# Patient Record
Sex: Female | Born: 1935 | Race: White | Hispanic: No | Smoking: Never smoker
Health system: Southern US, Community
[De-identification: ages and names within clinical notes are randomized; demographics above are authoritative.]

## PROBLEM LIST (undated history)

## (undated) DIAGNOSIS — E785 Hyperlipidemia, unspecified: Secondary | ICD-10-CM

## (undated) DIAGNOSIS — C801 Malignant (primary) neoplasm, unspecified: Secondary | ICD-10-CM

## (undated) DIAGNOSIS — I10 Essential (primary) hypertension: Secondary | ICD-10-CM

## (undated) DIAGNOSIS — C44711 Basal cell carcinoma of skin of unspecified lower limb, including hip: Secondary | ICD-10-CM

## (undated) HISTORY — PX: AMPUTATION TOE: SHX6595

## (undated) HISTORY — DX: Basal cell carcinoma of skin of unspecified lower limb, including hip: C44.711

## (undated) HISTORY — DX: Essential (primary) hypertension: I10

## (undated) HISTORY — DX: Hyperlipidemia, unspecified: E78.5

---

## 2000-10-13 ENCOUNTER — Other Ambulatory Visit: Admission: RE | Admit: 2000-10-13 | Discharge: 2000-10-13 | Payer: Self-pay | Admitting: Family Medicine

## 2000-11-10 ENCOUNTER — Encounter: Payer: Self-pay | Admitting: Family Medicine

## 2000-11-10 ENCOUNTER — Encounter: Admission: RE | Admit: 2000-11-10 | Discharge: 2000-11-10 | Payer: Self-pay | Admitting: Family Medicine

## 2001-11-03 ENCOUNTER — Encounter: Admission: RE | Admit: 2001-11-03 | Discharge: 2001-11-03 | Payer: Self-pay | Admitting: Family Medicine

## 2001-11-03 ENCOUNTER — Encounter: Payer: Self-pay | Admitting: Family Medicine

## 2003-10-03 ENCOUNTER — Encounter: Admission: RE | Admit: 2003-10-03 | Discharge: 2003-10-03 | Payer: Self-pay | Admitting: Family Medicine

## 2003-10-14 ENCOUNTER — Encounter: Admission: RE | Admit: 2003-10-14 | Discharge: 2003-10-14 | Payer: Self-pay | Admitting: Family Medicine

## 2004-11-11 ENCOUNTER — Encounter: Admission: RE | Admit: 2004-11-11 | Discharge: 2004-11-11 | Payer: Self-pay | Admitting: Family Medicine

## 2005-11-30 ENCOUNTER — Encounter: Admission: RE | Admit: 2005-11-30 | Discharge: 2005-11-30 | Payer: Self-pay | Admitting: Family Medicine

## 2006-12-06 ENCOUNTER — Ambulatory Visit: Payer: Self-pay | Admitting: Cardiology

## 2006-12-14 ENCOUNTER — Encounter: Payer: Self-pay | Admitting: Cardiology

## 2006-12-14 ENCOUNTER — Ambulatory Visit: Payer: Self-pay

## 2006-12-21 ENCOUNTER — Encounter: Admission: RE | Admit: 2006-12-21 | Discharge: 2006-12-21 | Payer: Self-pay | Admitting: Family Medicine

## 2007-12-22 ENCOUNTER — Encounter: Admission: RE | Admit: 2007-12-22 | Discharge: 2007-12-22 | Payer: Self-pay | Admitting: Family Medicine

## 2008-12-23 ENCOUNTER — Encounter: Admission: RE | Admit: 2008-12-23 | Discharge: 2008-12-23 | Payer: Self-pay | Admitting: Family Medicine

## 2009-01-15 ENCOUNTER — Encounter: Admission: RE | Admit: 2009-01-15 | Discharge: 2009-01-15 | Payer: Self-pay | Admitting: Family Medicine

## 2009-04-15 ENCOUNTER — Ambulatory Visit (HOSPITAL_COMMUNITY): Admission: RE | Admit: 2009-04-15 | Discharge: 2009-04-15 | Payer: Self-pay | Admitting: Ophthalmology

## 2009-12-30 ENCOUNTER — Encounter: Admission: RE | Admit: 2009-12-30 | Discharge: 2009-12-30 | Payer: Self-pay | Admitting: Family Medicine

## 2010-06-08 LAB — HEMOGLOBIN AND HEMATOCRIT, BLOOD: Hemoglobin: 12.9 g/dL (ref 12.0–15.0)

## 2010-06-08 LAB — BASIC METABOLIC PANEL
CO2: 29 mEq/L (ref 19–32)
Calcium: 9.1 mg/dL (ref 8.4–10.5)
Creatinine, Ser: 0.8 mg/dL (ref 0.4–1.2)
GFR calc non Af Amer: >60 mL/min (ref >60)
Potassium: 4.1 mEq/L (ref 3.5–5.1)

## 2010-08-04 NOTE — Assessment & Plan Note (Signed)
[facility]                            CARDIOLOGY OFFICE NOTE   NAME:SCOTTZailyn, Thoennes                        MRN:          [id number]  DATE:12/06/2006                            DOB:          1926/01/02    I was asked to consult on this patient by Dr. Christell Constant concerning an  abnormal EKG.   HISTORY OF PRESENT ILLNESS:  The patient is a delightful, very active 77-  year-old white female who comes today for an abnormal EKG.  She was at  [facility] the other day and had an EKG which showed  poor progression across the anterior precordium consistent with a  possible old septal infarct.   She is totally asymptomatic.  She is extremely active and actually goes  dancing including square dancing every Saturday night.   She is having no symptoms of angina, shortness of breath, dyspnea on  exertion, any ischemic symptoms.  She denies any PND or orthopnea or  peripheral edema.   She had pneumonia about four years ago and said at that time she might  have had a heart attack, she was so sick.   ALLERGIES:  She is allergic to PENICILLIN and CODEINE.   CURRENT MEDICATIONS:  1. Aspirin 81 mg daily.  2. Centrum Silver daily.  3. Caltrate and vitamin D.  4. Fish oil 1200 mg daily.  5. Benicar 40 mg daily.   PAST MEDICAL HISTORY:  1. She carries a diagnosis of hypertension.  2. Chest x-ray on November 29, 2006, showed some COPD, cardiomegaly      which was mild, tortuous thoracic aorta.  3. She also carries a diagnosis of hyperlipidemia.  She is being      managed medically with this.   FAMILY HISTORY:  History of coronary disease and hypertension.  One of  her siblings has a pacemaker.   SOCIAL HISTORY:  [place]  She is with  her daughter today. She is widowed.   REVIEW OF SYSTEMS:  Other than in the HPI, is negative.   PHYSICAL EXAMINATION:  VITAL SIGNS:  Blood pressure is elevated today at  194/104.   She says it is always elevated at the doctor's office.  She  says it runs good at home.  Her pulse is 85 and regular.  EKG is normal.  She does have poor R-wave progression in the anterior precordium but I  think it is probably within normal limits.  Her weight is 117.  HEENT:  Normocephalic and atraumatic.  PERRLA.  Extraocular movements  intact.  Sclerae are clear.  Facial symmetry is normal.  NECK:  Carotid upstrokes are equal bilaterally without bruits, no JVD.  Thyroid is not enlarged.  Trachea is midline.  LUNGS:  Clear with decreased breath sounds throughout.  CARDIOVASCULAR:  There is a nondisplaced PMI. She has normal S1 and S2  without gallop.  ABDOMEN:  Soft with good bowel sounds.  No midline bruit.  There is no  obvious hepatomegaly.  EXTREMITIES:  Without clubbing, cyanosis, or edema.  Pulses are intact.  She has some varicose veins.  NEUROLOGIC:  Intact.   ASSESSMENT:  1. Possible abnormal EKG.  Rule out anterior septal wall infarct.  I      doubt this is the case.  2. Hypertension, particularly white coat in this circumstance.  This      needs careful follow-up and check on an outpatient basis.  3. Mild cardiomegaly on chest x-ray with a tortuous aorta.   RECOMMENDATIONS:  A 2-D echocardiogram  to assess whether or not she has  any cardiomegaly, left ventricular function, specific look at anterior  wall, as well as her aortic outflow tract.   Assuming this will be normal or unremarkable, will see her back on a  p.r.n. basis.     Thomas C. Daleen Squibb, MD, [facility]  Electronically Signed    TCW/MedQ  DD: 12/06/2006  DT: 12/07/2006  Job #: [id number]   cc:   [facility]

## 2010-11-16 ENCOUNTER — Encounter: Payer: Self-pay | Admitting: Women's Health

## 2011-03-01 ENCOUNTER — Other Ambulatory Visit: Payer: Self-pay | Admitting: Family Medicine

## 2011-03-01 DIAGNOSIS — Z1231 Encounter for screening mammogram for malignant neoplasm of breast: Secondary | ICD-10-CM

## 2011-04-05 ENCOUNTER — Ambulatory Visit
Admission: RE | Admit: 2011-04-05 | Discharge: 2011-04-05 | Disposition: A | Discharge status: Home / Self Care | Payer: Self-pay | Source: Ambulatory Visit | Attending: Family Medicine | Admitting: Family Medicine

## 2011-04-05 DIAGNOSIS — Z1231 Encounter for screening mammogram for malignant neoplasm of breast: Secondary | ICD-10-CM

## 2012-03-01 ENCOUNTER — Other Ambulatory Visit: Payer: Self-pay | Admitting: Family Medicine

## 2012-03-01 DIAGNOSIS — Z1231 Encounter for screening mammogram for malignant neoplasm of breast: Secondary | ICD-10-CM

## 2012-04-10 ENCOUNTER — Ambulatory Visit
Admission: RE | Admit: 2012-04-10 | Discharge: 2012-04-10 | Disposition: A | Discharge status: Home / Self Care | Payer: Medicare Other | Source: Ambulatory Visit | Attending: Family Medicine | Admitting: Family Medicine

## 2012-04-10 DIAGNOSIS — Z1231 Encounter for screening mammogram for malignant neoplasm of breast: Secondary | ICD-10-CM

## 2012-08-02 ENCOUNTER — Ambulatory Visit (INDEPENDENT_AMBULATORY_CARE_PROVIDER_SITE_OTHER): Payer: Medicare Other | Admitting: Family Medicine

## 2012-08-02 ENCOUNTER — Encounter: Payer: Self-pay | Admitting: Family Medicine

## 2012-08-02 VITALS — BP 180/90 | HR 74 | Temp 98.0°F | Resp 18 | Ht 62.0 in | Wt 118.0 lb

## 2012-08-02 DIAGNOSIS — E785 Hyperlipidemia, unspecified: Secondary | ICD-10-CM | POA: Insufficient documentation

## 2012-08-02 DIAGNOSIS — M436 Torticollis: Secondary | ICD-10-CM

## 2012-08-02 DIAGNOSIS — I10 Essential (primary) hypertension: Secondary | ICD-10-CM | POA: Insufficient documentation

## 2012-08-02 MED ORDER — PREDNISONE 20 MG PO TABS: ORAL_TABLET | ORAL | Status: DC

## 2012-08-02 MED ORDER — CYCLOBENZAPRINE HCL 5 MG PO TABS: 5.0000 mg | ORAL_TABLET | Freq: Three times a day (TID) | ORAL | Status: DC | PRN

## 2012-08-02 NOTE — Progress Notes (Signed)
Subjective:    Patient ID: Shirley Bates, female    DOB: Sep 29, 1925, 79 y.o.   MRN: [id number]  HPI Patient has a history of whitecoat hypertension.  Her blood pressure home today was 120/70.   She presents with weeks of pain and stiffness in her neck. She awoke with the pain one morning. She thought it was a "crick".  She just had heat and time but the tenseness and pain have not improved. She denies any cervical radiculopathy, paresthesias, dysesthesias in the arm. She denies any injury to the neck. She denies any recent motor vehicle accident.    She also reports worsening allergies.  She's tried Allegra with minimal success. Her sinuses feel well. She reports head congestion. She reports postnasal drip and sneezing. She denies any fever or sinus pain. Past Medical History  Diagnosis Date  . Hyperlipidemia   . Hypertension   . Basal cell carcinoma of leg     left 2014   Current outpatient prescriptions:aspirin 81 MG tablet, Take 81 mg by mouth daily., Disp: , Rfl: ;  calcium gluconate 500 MG tablet, Take 500 mg by mouth daily., Disp: , Rfl: ;  fish oil-omega-3 fatty acids 1000 MG capsule, Take 2 g by mouth daily., Disp: , Rfl: ;  losartan (COZAAR) 100 MG tablet, Take 100 mg by mouth daily., Disp: , Rfl: ;  multivitamin-iron-minerals-folic acid (CENTRUM) chewable tablet, Chew 1 tablet by mouth daily., Disp: , Rfl:  simvastatin (ZOCOR) 20 MG tablet, Take 20 mg by mouth every evening., Disp: , Rfl: ;  cyclobenzaprine (FLEXERIL) 5 MG tablet, Take 1 tablet (5 mg total) by mouth 3 (three) times daily as needed (neck stiffness)., Disp: 30 tablet, Rfl: 0;  predniSONE (DELTASONE) 20 MG tablet, Take 3 tabs on days 1-2, 2 tabs on days 3-4, 1 tab on days 5-6, Disp: 12 tablet, Rfl: 0  No Known Allergies   Review of Systems  All other systems reviewed and are negative.       Objective:   Physical Exam  Vitals reviewed. Constitutional: She is oriented to person, place, and time.  HENT:  Right  Ear: External ear normal.  Left Ear: External ear normal.  Nose: Nose normal.  Mouth/Throat: Oropharynx is clear and moist.  Neck: Neck supple. No JVD present. No thyromegaly present.  Cardiovascular: Normal rate, normal heart sounds and intact distal pulses.   No murmur heard. Pulmonary/Chest: Effort normal and breath sounds normal. No respiratory distress. She has no wheezes. She has no rales.  Musculoskeletal:       Cervical back: She exhibits decreased range of motion, tenderness (Over the paraspinal muscles) and spasm. She exhibits no bony tenderness, no swelling and no deformity.  Lymphadenopathy:    She has no cervical adenopathy.  Neurological: She is alert and oriented to person, place, and time. She has normal reflexes. She displays normal reflexes. No cranial nerve deficit. Coordination normal.          Assessment & Plan:  1. Wry neck I believe this most likely represents a strain in the trapezius or the cervical paraspinal muscles.  Will try prednisone taper pack to reduce inflammation and low dose muscle relaxer to ease muscle spasm. If pain persists will obtain cervical spine x-rays and consider physical therapy consult for TENS unit.  The patient was warned about sedation on Flexeril - predniSONE (DELTASONE) 20 MG tablet; Take 3 tabs on days 1-2, 2 tabs on days 3-4, 1 tab on days 5-6  Dispense: 12 tablet;  Refill: 0 - cyclobenzaprine (FLEXERIL) 5 MG tablet; Take 1 tablet (5 mg total) by mouth 3 (three) times daily as needed (neck stiffness).  Dispense: 30 tablet; Refill: 0  2.  whitecoat syndrome-blood pressure today can be disregarded. She frequently checks her blood pressure at home and is well controlled on a blood pressure cuff that has been verified to be accurate.  3.  Allergies Recommended over-the-counter Nasacort.

## 2012-08-09 ENCOUNTER — Telehealth: Payer: Self-pay | Admitting: Family Medicine

## 2012-08-11 NOTE — Telephone Encounter (Signed)
Dr. Pickard aware 

## 2012-10-31 ENCOUNTER — Other Ambulatory Visit: Payer: Self-pay | Admitting: Family Medicine

## 2012-10-31 ENCOUNTER — Other Ambulatory Visit: Payer: Self-pay | Admitting: Physician Assistant

## 2012-10-31 ENCOUNTER — Telehealth: Payer: Self-pay | Admitting: Family Medicine

## 2012-10-31 NOTE — Telephone Encounter (Signed)
Simvastatin 20 mg tab 1 QHS #30 Losartan Potassium 100 mg tab 1 QD #30

## 2012-10-31 NOTE — Telephone Encounter (Signed)
Medication refilled per protocol. 

## 2012-11-01 NOTE — Telephone Encounter (Signed)
Done 10/31/12

## 2012-11-10 ENCOUNTER — Encounter: Payer: Self-pay | Admitting: Family Medicine

## 2012-11-10 ENCOUNTER — Ambulatory Visit (INDEPENDENT_AMBULATORY_CARE_PROVIDER_SITE_OTHER): Payer: Medicare Other | Admitting: Family Medicine

## 2012-11-10 ENCOUNTER — Ambulatory Visit
Admission: RE | Admit: 2012-11-10 | Discharge: 2012-11-10 | Disposition: A | Discharge status: Home / Self Care | Payer: Medicare Other | Source: Ambulatory Visit | Attending: Family Medicine | Admitting: Family Medicine

## 2012-11-10 VITALS — HR 78 | Temp 99.0°F | Resp 18 | Wt 118.0 lb

## 2012-11-10 DIAGNOSIS — M25551 Pain in right hip: Secondary | ICD-10-CM

## 2012-11-10 DIAGNOSIS — M25559 Pain in unspecified hip: Secondary | ICD-10-CM

## 2012-11-10 NOTE — Progress Notes (Signed)
Subjective:    Patient ID: Shirley Bates, female    DOB: [birth year], 79 y.o.   MRN: [id number]  HPI One week ago, the patient struck a piece of hard her with her right hip over the greater trochanter. She now has swelling and pain around the greater trochanter and a large hematoma inferior to the greater trochanter. She is walking without pain. She is still dancing without pain. She has full painless range of motion in her right hip. However she is tender to palpation over the greater trochanter.  There is obvious swelling over the greater trochanter on exam today. Her daughter recommended that she be evaluated.   Past Medical History  Diagnosis Date  . Hyperlipidemia   . Hypertension   . Basal cell carcinoma of leg     left 2014   Current Outpatient Prescriptions on File Prior to Visit  Medication Sig Dispense Refill  . aspirin 81 MG tablet Take 81 mg by mouth daily.      . calcium gluconate 500 MG tablet Take 500 mg by mouth daily.      . cyclobenzaprine (FLEXERIL) 5 MG tablet Take 1 tablet (5 mg total) by mouth 3 (three) times daily as needed (neck stiffness).  30 tablet  0  . fish oil-omega-3 fatty acids 1000 MG capsule Take 2 g by mouth daily.      Marland Kitchen losartan (COZAAR) 100 MG tablet TAKE 1 TABLET EVERY DAY  30 tablet  5  . multivitamin-iron-minerals-folic acid (CENTRUM) chewable tablet Chew 1 tablet by mouth daily.      . predniSONE (DELTASONE) 20 MG tablet Take 3 tabs on days 1-2, 2 tabs on days 3-4, 1 tab on days 5-6  12 tablet  0  . simvastatin (ZOCOR) 20 MG tablet TAKE 1 TABLET AT BEDTIME  30 tablet  5   No current facility-administered medications on file prior to visit.   No past surgical history on file. No Known Allergies History   Social History  . Marital Status: Divorced    Spouse Name: N/A    Number of Children: N/A  . Years of Education: N/A   Occupational History  . Not on file.   Social History Main Topics  . Smoking status: Never Smoker   . Smokeless  tobacco: Not on file  . Alcohol Use: Not on file  . Drug Use: Not on file  . Sexual Activity: Not on file   Other Topics Concern  . Not on file   Social History Narrative  . No narrative on file      Review of Systems  All other systems reviewed and are negative.       Objective:   Physical Exam  Vitals reviewed. Constitutional: She appears well-developed and well-nourished.  Cardiovascular: Normal rate and regular rhythm.   Pulmonary/Chest: Effort normal and breath sounds normal.  Musculoskeletal:       Right hip: She exhibits tenderness, bony tenderness and swelling. She exhibits normal range of motion and normal strength.   patient is tender to palpation over the right greater trochanter. There is swelling around the right greater trochanter. There is a large hematoma inferior to the greater trochanter approximately 7 cm x 8 cm in size. She has full painless range of motion in the right hip without crepitus or pain.  She refuses to allow [place] to check her blood pressure.        Assessment & Plan:  1. Hip pain, acute, right I believe the patient  suffered a traumatic bursitis.  I will obtain an x-ray of the right hip to rule out any subclinical fracture of the greater trochanter. If the x-ray is negative I would recommend symptomatic therapy only. I anticipate slow improvement over the next 4-6 weeks and resolution of the hematoma during that time. If the x-ray is negative, she could receive a cortisone injection for greater trochanter bursitis if the pain persists. - DG Hip Complete Right; Future

## 2012-12-12 ENCOUNTER — Ambulatory Visit (INDEPENDENT_AMBULATORY_CARE_PROVIDER_SITE_OTHER): Payer: Medicare Other | Admitting: Family Medicine

## 2012-12-12 ENCOUNTER — Encounter: Payer: Self-pay | Admitting: Family Medicine

## 2012-12-12 VITALS — BP 140/88 | HR 70 | Temp 97.9°F | Resp 16 | Wt 116.0 lb

## 2012-12-12 DIAGNOSIS — M47812 Spondylosis without myelopathy or radiculopathy, cervical region: Secondary | ICD-10-CM

## 2012-12-12 DIAGNOSIS — H6120 Impacted cerumen, unspecified ear: Secondary | ICD-10-CM

## 2012-12-12 DIAGNOSIS — Z23 Encounter for immunization: Secondary | ICD-10-CM

## 2012-12-12 DIAGNOSIS — H6122 Impacted cerumen, left ear: Secondary | ICD-10-CM | POA: Insufficient documentation

## 2012-12-12 NOTE — Progress Notes (Signed)
  Subjective:    Patient ID: Shirley Bates, female    DOB: Jan 01, 1926, 79 y.o.   MRN: [id number]  HPI  Pt here for wax removal. Had hearing exam done last week, told she needed left ear cleaned. Feels clogged up. At this time does not need hearing aide. Neck stiffness- continues to have stiffness and decreased ROM, she was asking about OA, but states she does not want anything done at this time, no meds, no xrays,states she will live with it   Review of Systems - per above  GEN- denies fatigue, fever, weight loss,weakness, recent illness HEENT- denies eye drainage, change in vision, nasal discharge, MSK- + joint pain, muscle aches, injury Neuro- denies headache, dizziness, syncope, seizure activity       Objective:   Physical Exam GEN-NAD, alert and oriented x 3 HEENT- EOMI, MMM, Partial wax impaction left ear, TM clear bialt no effusion, Right canal clear, hearing grossly in tact, decreased with soft or low voice NECK-decreased ROM, crepitus with lateral rotation, spine NT, no spasm       Assessment & Plan:

## 2012-12-12 NOTE — Assessment & Plan Note (Signed)
S/p irrigation at bedside 

## 2012-12-12 NOTE — Assessment & Plan Note (Signed)
She decline meds or imaging at this time. I think this is reasonable as she is able to perform her regular activities with out significant pain or discomfort

## 2012-12-12 NOTE — Patient Instructions (Signed)
Ear flushed today Call if neck gives any further problems and you want xray done

## 2013-03-05 ENCOUNTER — Other Ambulatory Visit: Payer: Self-pay

## 2013-03-05 DIAGNOSIS — Z1231 Encounter for screening mammogram for malignant neoplasm of breast: Secondary | ICD-10-CM

## 2013-04-06 ENCOUNTER — Ambulatory Visit: Payer: Medicare Other | Admitting: Family Medicine

## 2013-04-09 ENCOUNTER — Encounter: Payer: Self-pay | Admitting: Physician Assistant

## 2013-04-09 ENCOUNTER — Ambulatory Visit (INDEPENDENT_AMBULATORY_CARE_PROVIDER_SITE_OTHER): Payer: Medicare Other | Admitting: Physician Assistant

## 2013-04-09 VITALS — BP 138/96 | HR 84 | Temp 98.3°F | Resp 18 | Wt 114.0 lb

## 2013-04-09 DIAGNOSIS — R197 Diarrhea, unspecified: Secondary | ICD-10-CM

## 2013-04-09 NOTE — Progress Notes (Signed)
Patient ID: TRAMYA SCHOENFELDER MRN: [id number], DOB: [birth year], 80 y.o. Date of Encounter: 04/09/2013, 12:37 PM    Chief Complaint:  Chief Complaint  Patient presents with  . c/o x 1 week    problem with diarrhea off/on , no nausea, no abd discomfort     HPI: 80 y.o. year old white female reports that she occasionally has some mild constipation and will use one stool softener about once every one to 2 months. Says that on the night of Sunday 04/01/13 she took one stool softener. The next day she had no BM So on the night of Monday 04/02/13 she took 2 stool softeners. Since then she's been having loose stools. He has taken no further stool softeners.  She says that she has not felt bad at all. Has had no abdominal pain. Has had no decrease in appetite. No nausea or vomiting. No fevers or chills. Says that the amount and frequency of the diarrhea has decreased but she thought she better come get it checked. Saturday 04/07/13 she only had about 2 loose stools. Yesterday as well she had about 2 or 3. So far today is only had one episode.     Home Meds: See attached medication section for any medications that were entered at today's visit. The computer does not put those onto this list.The following list is a list of meds entered prior to today's visit.   Current Outpatient Prescriptions on File Prior to Visit  Medication Sig Dispense Refill  . aspirin 81 MG tablet Take 81 mg by mouth daily.      Marland Kitchen losartan (COZAAR) 100 MG tablet TAKE 1 TABLET EVERY DAY  30 tablet  5  . multivitamin-iron-minerals-folic acid (CENTRUM) chewable tablet Chew 1 tablet by mouth daily.      . simvastatin (ZOCOR) 20 MG tablet TAKE 1 TABLET AT BEDTIME  30 tablet  5  . cyclobenzaprine (FLEXERIL) 5 MG tablet Take 1 tablet (5 mg total) by mouth 3 (three) times daily as needed (neck stiffness).  30 tablet  0   No current facility-administered medications on file prior to visit.    Allergies: No Known Allergies     Review of Systems: See HPI for pertinent ROS. All other ROS negative.    Physical Exam: Blood pressure 138/96, pulse 84, temperature 98.3 F (36.8 C), temperature source Oral, resp. rate 18, weight 114 lb (51.71 kg)., Body mass index is 20.85 kg/(m^2). General: WNWD WF. Appears in no acute distress. Lungs: Clear bilaterally to auscultation without wheezes, rales, or rhonchi. Breathing is unlabored. Heart: Regular rhythm. No murmurs, rubs, or gallops. Abdomen: Soft, non-tender, non-distended with normoactive bowel sounds. No hepatomegaly. No rebound/guarding. No obvious abdominal masses. Msk:  Strength and tone normal for age. Extremities/Skin: Warm and dry.  Neuro: Alert and oriented X 3. Moves all extremities spontaneously. Gait is normal. CNII-XII grossly in tact. Psych:  Responds to questions appropriately with a normal affect.     ASSESSMENT AND PLAN:  80 y.o. year old female with  1. Diarrhea Patient brought a list of her medications. Reassured her that I do not think any of her medicines are causing this diarrhea. Reassured her that I think that the loose stools are all the patient is a taking a total of 3 stool softeners which is a lot for her system. Told her to avoid eating a lot of salads or vegetables till this resolves. I expect that this will gradually resolve over the upcoming one week. If  worsens or if it does not resolve within one week and follow up again.   [address] [place], [place], [facility] 04/09/2013 12:37 PM

## 2013-04-23 ENCOUNTER — Other Ambulatory Visit: Payer: Medicare Other

## 2013-04-23 DIAGNOSIS — Z79899 Other long term (current) drug therapy: Secondary | ICD-10-CM

## 2013-04-23 DIAGNOSIS — E785 Hyperlipidemia, unspecified: Secondary | ICD-10-CM

## 2013-04-23 DIAGNOSIS — I10 Essential (primary) hypertension: Secondary | ICD-10-CM

## 2013-04-23 LAB — COMPREHENSIVE METABOLIC PANEL
ALBUMIN: 4.1 g/dL (ref 3.5–5.2)
ALK PHOS: 59 U/L (ref 39–117)
ALT: 16 U/L (ref 0–35)
AST: 23 U/L (ref 0–37)
BILIRUBIN TOTAL: 0.5 mg/dL (ref 0.2–1.2)
BUN: 11 mg/dL (ref 6–23)
CALCIUM: 10.2 mg/dL (ref 8.4–10.5)
CO2: 31 mEq/L (ref 19–32)
Chloride: 100 mEq/L (ref 96–112)
Creat: 0.7 mg/dL (ref 0.50–1.10)
GLUCOSE: 101 mg/dL — AB (ref 70–99)
POTASSIUM: 4.7 meq/L (ref 3.5–5.3)
Sodium: 140 mEq/L (ref 135–145)
Total Protein: 6.7 g/dL (ref 6.0–8.3)

## 2013-04-23 LAB — LIPID PANEL
CHOL/HDL RATIO: 2.4 ratio
CHOLESTEROL: 185 mg/dL (ref 0–200)
HDL: 76 mg/dL (ref >39)
LDL CALC: 88 mg/dL (ref 0–99)
TRIGLYCERIDES: 107 mg/dL (ref <150)
VLDL: 21 mg/dL (ref 0–40)

## 2013-04-23 LAB — CBC WITH DIFFERENTIAL/PLATELET
BASOS ABS: 0 10*3/uL (ref 0.0–0.1)
Basophils Relative: 0 % (ref 0–1)
EOS ABS: 0.1 10*3/uL (ref 0.0–0.7)
Eosinophils Relative: 1 % (ref 0–5)
HCT: 39 % (ref 36.0–46.0)
Hemoglobin: 12.9 g/dL (ref 12.0–15.0)
Lymphocytes Relative: 23 % (ref 12–46)
Lymphs Abs: 1.3 10*3/uL (ref 0.7–4.0)
MCH: 31.2 pg (ref 26.0–34.0)
MCHC: 33.1 g/dL (ref 30.0–36.0)
MCV: 94.2 fL (ref 78.0–100.0)
MONO ABS: 0.4 10*3/uL (ref 0.1–1.0)
MONOS PCT: 8 % (ref 3–12)
Neutro Abs: 3.8 10*3/uL (ref 1.7–7.7)
Neutrophils Relative %: 68 % (ref 43–77)
PLATELETS: 241 10*3/uL (ref 150–400)
RBC: 4.14 MIL/uL (ref 3.87–5.11)
RDW: 14 % (ref 11.5–15.5)
WBC: 5.5 10*3/uL (ref 4.0–10.5)

## 2013-04-24 ENCOUNTER — Ambulatory Visit
Admission: RE | Admit: 2013-04-24 | Discharge: 2013-04-24 | Disposition: A | Discharge status: Home / Self Care | Payer: Medicare Other | Source: Ambulatory Visit

## 2013-04-24 DIAGNOSIS — Z1231 Encounter for screening mammogram for malignant neoplasm of breast: Secondary | ICD-10-CM

## 2013-04-26 ENCOUNTER — Encounter: Payer: Medicare Other | Admitting: Family Medicine

## 2013-04-27 ENCOUNTER — Ambulatory Visit (INDEPENDENT_AMBULATORY_CARE_PROVIDER_SITE_OTHER): Payer: Medicare Other | Admitting: Family Medicine

## 2013-04-27 ENCOUNTER — Encounter: Payer: Self-pay | Admitting: Family Medicine

## 2013-04-27 VITALS — BP 150/90 | HR 74 | Temp 97.6°F | Resp 16 | Ht 62.0 in | Wt 117.0 lb

## 2013-04-27 DIAGNOSIS — Z0000 Encounter for general adult medical examination without abnormal findings: Secondary | ICD-10-CM

## 2013-04-27 DIAGNOSIS — M899 Disorder of bone, unspecified: Secondary | ICD-10-CM

## 2013-04-27 DIAGNOSIS — E785 Hyperlipidemia, unspecified: Secondary | ICD-10-CM

## 2013-04-27 DIAGNOSIS — I10 Essential (primary) hypertension: Secondary | ICD-10-CM

## 2013-04-27 DIAGNOSIS — M949 Disorder of cartilage, unspecified: Secondary | ICD-10-CM

## 2013-04-27 DIAGNOSIS — M8580 Other specified disorders of bone density and structure, unspecified site: Secondary | ICD-10-CM

## 2013-04-27 DIAGNOSIS — Z23 Encounter for immunization: Secondary | ICD-10-CM

## 2013-04-27 NOTE — Progress Notes (Signed)
Subjective:    Patient ID: Shirley Bates, female    DOB: May 11, 1925, 80 y.o.   MRN: [id number]  HPI Patient is a very active 80 year old white female who comes in today for complete physical exam. She has white coat hypertension. Her blood pressure at home is ranging 133-142/72-78. Her blood pressures at home are well controlled. She had Pneumovax 23 in 2004. She is due for Prevnar 13. Her last tetanus shot was in 2010. She declined Zostavax. She had her flu shot this fall. Her last colonoscopy was in 2010 and was normal. Her last Pap smear was in 2008 and is no longer required due to her age. Her last mammogram was performed this week and was normal. Her last bone density was in 2010 and was significant for osteopenia in the lumbar spine. Her most recent lab work is listed: Lab on 04/23/2013  Component Date Value Range Status  . WBC 04/23/2013 5.5  4.0 - 10.5 K/uL Final  . RBC 04/23/2013 4.14  3.87 - 5.11 MIL/uL Final  . Hemoglobin 04/23/2013 12.9  12.0 - 15.0 g/dL Final  . HCT 04/23/2013 39.0  36.0 - 46.0 % Final  . MCV 04/23/2013 94.2  78.0 - 100.0 fL Final  . MCH 04/23/2013 31.2  26.0 - 34.0 pg Final  . MCHC 04/23/2013 33.1  30.0 - 36.0 g/dL Final  . RDW 04/23/2013 14.0  11.5 - 15.5 % Final  . Platelets 04/23/2013 241  150 - 400 K/uL Final  . Neutrophils Relative % 04/23/2013 68  43 - 77 % Final  . Neutro Abs 04/23/2013 3.8  1.7 - 7.7 K/uL Final  . Lymphocytes Relative 04/23/2013 23  12 - 46 % Final  . Lymphs Abs 04/23/2013 1.3  0.7 - 4.0 K/uL Final  . Monocytes Relative 04/23/2013 8  3 - 12 % Final  . Monocytes Absolute 04/23/2013 0.4  0.1 - 1.0 K/uL Final  . Eosinophils Relative 04/23/2013 1  0 - 5 % Final  . Eosinophils Absolute 04/23/2013 0.1  0.0 - 0.7 K/uL Final  . Basophils Relative 04/23/2013 0  0 - 1 % Final  . Basophils Absolute 04/23/2013 0.0  0.0 - 0.1 K/uL Final  . Smear Review 04/23/2013 Criteria for review not met   Final  . Sodium 04/23/2013 140  135 - 145 mEq/L  Final  . Potassium 04/23/2013 4.7  3.5 - 5.3 mEq/L Final  . Chloride 04/23/2013 100  96 - 112 mEq/L Final  . CO2 04/23/2013 31  19 - 32 mEq/L Final  . Glucose, Bld 04/23/2013 101* 70 - 99 mg/dL Final  . BUN 04/23/2013 11  6 - 23 mg/dL Final  . Creat 04/23/2013 0.70  0.50 - 1.10 mg/dL Final  . Total Bilirubin 04/23/2013 0.5  0.2 - 1.2 mg/dL Final   ** Please note change in reference range(s). **  . Alkaline Phosphatase 04/23/2013 59  39 - 117 U/L Final  . AST 04/23/2013 23  0 - 37 U/L Final  . ALT 04/23/2013 16  0 - 35 U/L Final  . Total Protein 04/23/2013 6.7  6.0 - 8.3 g/dL Final  . Albumin 04/23/2013 4.1  3.5 - 5.2 g/dL Final  . Calcium 04/23/2013 10.2  8.4 - 10.5 mg/dL Final  . Cholesterol 04/23/2013 185  0 - 200 mg/dL Final   Comment: ATP III Classification:                                <  200        mg/dL        Desirable                               200 - 239     mg/dL        Borderline High                               >= 240        mg/dL        High                             . Triglycerides 04/23/2013 107  <150 mg/dL Final  . HDL 04/23/2013 76  >39 mg/dL Final  . Total CHOL/HDL Ratio 04/23/2013 2.4   Final  . VLDL 04/23/2013 21  0 - 40 mg/dL Final  . LDL Cholesterol 04/23/2013 88  0 - 99 mg/dL Final   Comment:                            Total Cholesterol/HDL Ratio:CHD Risk                                                 Coronary Heart Disease Risk Table                                                                 Men       Women                                   1/2 Average Risk              3.4        3.3                                       Average Risk              5.0        4.4                                    2X Average Risk              9.6        7.1                                    3X Average Risk             23.4       11.0  Use the calculated Patient Ratio above and the CHD Risk table                           to determine the  patient's CHD Risk.                          ATP III Classification (LDL):                                < 100        mg/dL         Optimal                               100 - 129     mg/dL         Near or Above Optimal                               130 - 159     mg/dL         Borderline High                               160 - 189     mg/dL         High                                > 190        mg/dL         Very High                              Past Medical History  Diagnosis Date  . Hyperlipidemia   . Hypertension   . Basal cell carcinoma of leg     left 2014   Current Outpatient Prescriptions on File Prior to Visit  Medication Sig Dispense Refill  . aspirin 81 MG tablet Take 81 mg by mouth daily.      . Calcium Carb-Cholecalciferol (CALCIUM 600 + D PO) Take 1 tablet by mouth 2 (two) times daily.      Marland Kitchen losartan (COZAAR) 100 MG tablet TAKE 1 TABLET EVERY DAY  30 tablet  5  . multivitamin-iron-minerals-folic acid (CENTRUM) chewable tablet Chew 1 tablet by mouth daily.      . Omega-3 Fatty Acids (FISH OIL) 1200 MG CAPS Take 1 capsule by mouth 2 (two) times daily.      . simvastatin (ZOCOR) 20 MG tablet TAKE 1 TABLET AT BEDTIME  30 tablet  5   No current facility-administered medications on file prior to visit.   No Known Allergies History   Social History  . Marital Status: Divorced    Spouse Name: N/A    Number of Children: N/A  . Years of Education: N/A   Occupational History  . Not on file.   Social History Main Topics  . Smoking status: Never Smoker   . Smokeless tobacco: Not on file  . Alcohol Use: No  . Drug Use: No  . Sexual Activity: No   Other Topics Concern  . Not on  file   Social History Narrative  . No narrative on file   Family History  Problem Relation Age of Onset  . Heart disease Father   . Heart disease Sister   . Heart disease Brother   . Hypertension Brother    The patient dances several times a week and is very active with  exercise   Review of Systems  All other systems reviewed and are negative.       Objective:   Physical Exam  Vitals reviewed. Constitutional: She is oriented to person, place, and time. She appears well-developed and well-nourished. No distress.  HENT:  Head: Normocephalic and atraumatic.  Right Ear: External ear normal.  Left Ear: External ear normal.  Nose: Nose normal.  Mouth/Throat: Oropharynx is clear and moist. No oropharyngeal exudate.  Eyes: Conjunctivae and EOM are normal. Pupils are equal, round, and reactive to light. Right eye exhibits no discharge. Left eye exhibits no discharge. No scleral icterus.  Neck: Normal range of motion. Neck supple. No JVD present. No tracheal deviation present. No thyromegaly present.  Cardiovascular: Normal rate, regular rhythm, normal heart sounds and intact distal pulses.  Exam reveals no gallop and no friction rub.   No murmur heard. Pulmonary/Chest: Effort normal and breath sounds normal. No stridor. No respiratory distress. She has no wheezes. She has no rales. She exhibits no tenderness.  Abdominal: Soft. Bowel sounds are normal. She exhibits no distension and no mass. There is no tenderness. There is no rebound and no guarding.  Musculoskeletal: Normal range of motion. She exhibits no edema and no tenderness.  Lymphadenopathy:    She has no cervical adenopathy.  Neurological: She is alert and oriented to person, place, and time. She has normal reflexes. No cranial nerve deficit. Coordination normal.  Skin: Skin is warm and dry. No rash noted. She is not diaphoretic. No erythema. No pallor.  Psychiatric: She has a normal mood and affect. Her behavior is normal. Judgment and thought content normal.          Assessment & Plan:  1. Routine general medical examination at a health care facility - DG Bone Density; Future  2. Osteopenia - DG Bone Density; Future  Patient's home blood pressures are well controlled. Her lab work is  excellent. No changes in medications at this time. Her cancer screening that is appropriate for her age is up to date. Her immunizations are now up-to-date. I did give the patient Prevnar 13. Also recommended Zostavax but she will check on the price first.  I will schedule patient for a bone density test given the fact that she had a T score of -2.1 2010. She may benefit from bisphosphonate to prevent fractures.

## 2013-04-27 NOTE — Addendum Note (Signed)
Addended by: Shary Decamp B on: 04/27/2013 08:41 AM   Modules accepted: Orders

## 2013-05-02 ENCOUNTER — Other Ambulatory Visit: Payer: Self-pay | Admitting: Family Medicine

## 2013-05-08 ENCOUNTER — Other Ambulatory Visit: Payer: Medicare Other

## 2013-05-18 ENCOUNTER — Other Ambulatory Visit: Payer: Medicare Other

## 2013-05-29 ENCOUNTER — Ambulatory Visit
Admission: RE | Admit: 2013-05-29 | Discharge: 2013-05-29 | Disposition: A | Discharge status: Home / Self Care | Payer: Medicare Other | Source: Ambulatory Visit | Attending: Family Medicine | Admitting: Family Medicine

## 2013-05-29 DIAGNOSIS — Z0000 Encounter for general adult medical examination without abnormal findings: Secondary | ICD-10-CM

## 2013-05-29 DIAGNOSIS — M8580 Other specified disorders of bone density and structure, unspecified site: Secondary | ICD-10-CM

## 2013-06-01 ENCOUNTER — Telehealth: Payer: Self-pay | Admitting: *Deleted

## 2013-06-01 MED ORDER — ALENDRONATE SODIUM 70 MG PO TABS: 70.0000 mg | ORAL_TABLET | ORAL | Status: DC

## 2013-06-01 NOTE — Telephone Encounter (Signed)
Call placed to patient and patient made aware.   Prescription sent to pharmacy.  

## 2013-06-01 NOTE — Telephone Encounter (Signed)
Returned call from patient.   Advised that the most frequent side effects noted with Fosamax is heart burn and joint pain. Advised that if she has increased muscle pain or chest pain to stop taking medication and make [facility] aware.

## 2013-06-01 NOTE — Telephone Encounter (Signed)
Message copied by Sheral Flow on Fri Jun 01, 2013  8:00 AM ------      Message from: Jenna Luo      Created: Thu May 31, 2013  7:30 AM       Patient has osteoporosis in the hip as well as in the forearm. I recommend Fosamax 70 mg weekly to prevent it from getting worse and to try to prevent hip fractures. ------

## 2013-06-01 NOTE — Telephone Encounter (Signed)
Message copied by Sheral Flow on Fri Jun 01, 2013 11:20 AM ------      Message from: Lenore Manner      Created: Fri Jun 01, 2013  8:30 AM      Regarding: RX question      Contact: [phone]       Pt is needing to speak to you about a medication that Dr Dennard Schaumann was going to call in, she is wanting to know about side effects or anything like that ------

## 2013-07-30 ENCOUNTER — Encounter: Payer: Self-pay | Admitting: Family Medicine

## 2013-07-30 ENCOUNTER — Ambulatory Visit (INDEPENDENT_AMBULATORY_CARE_PROVIDER_SITE_OTHER): Payer: Medicare Other | Admitting: Family Medicine

## 2013-07-30 VITALS — BP 160/90 | HR 72 | Temp 98.0°F | Resp 16 | Ht 62.0 in | Wt 119.0 lb

## 2013-07-30 DIAGNOSIS — J302 Other seasonal allergic rhinitis: Secondary | ICD-10-CM

## 2013-07-30 DIAGNOSIS — J309 Allergic rhinitis, unspecified: Secondary | ICD-10-CM

## 2013-07-30 MED ORDER — PREDNISONE 20 MG PO TABS: ORAL_TABLET | ORAL | Status: DC

## 2013-07-30 MED ORDER — FLUTICASONE PROPIONATE 50 MCG/ACT NA SUSP: 2.0000 | Freq: Every day | NASAL | Status: DC

## 2013-07-30 NOTE — Progress Notes (Signed)
Subjective:    Patient ID: Shirley Bates, female    DOB: 1925/12/23, 80 y.o.   MRN: [id number]  HPI Patient has been battling seasonal allergies the last 2-3 weeks. She's tried over-the-counter Allegra with no benefit. She reports constant postnasal drip. She's having sinus pressure and sinus drainage. She reports sinus headache. She reports a constant cough when she lies supine at night the only improves when she sits upright in a chair. She denies any fevers or chills.  Cough is nonproductive. She denies any hemoptysis or pleurisy. Past Medical History  Diagnosis Date  . Hyperlipidemia   . Hypertension   . Basal cell carcinoma of leg     left 2014   Current Outpatient Prescriptions on File Prior to Visit  Medication Sig Dispense Refill  . alendronate (FOSAMAX) 70 MG tablet Take 1 tablet (70 mg total) by mouth every 7 (seven) days. Take with a full glass of water on an empty stomach.  4 tablet  11  . aspirin 81 MG tablet Take 81 mg by mouth daily.      . Calcium Carb-Cholecalciferol (CALCIUM 600 + D PO) Take 1 tablet by mouth 2 (two) times daily.      Marland Kitchen losartan (COZAAR) 100 MG tablet TAKE 1 TABLET EVERY DAY  30 tablet  5  . multivitamin-iron-minerals-folic acid (CENTRUM) chewable tablet Chew 1 tablet by mouth daily.      . Omega-3 Fatty Acids (FISH OIL) 1200 MG CAPS Take 1 capsule by mouth 2 (two) times daily.      . simvastatin (ZOCOR) 20 MG tablet TAKE 1 TABLET AT BEDTIME  30 tablet  5   No current facility-administered medications on file prior to visit.   No Known Allergies History   Social History  . Marital Status: Divorced    Spouse Name: N/A    Number of Children: N/A  . Years of Education: N/A   Occupational History  . Not on file.   Social History Main Topics  . Smoking status: Never Smoker   . Smokeless tobacco: Not on file  . Alcohol Use: No  . Drug Use: No  . Sexual Activity: No   Other Topics Concern  . Not on file   Social History Narrative  . No  narrative on file      Review of Systems  All other systems reviewed and are negative.      Objective:   Physical Exam  Vitals reviewed. Constitutional: She appears well-developed and well-nourished.  HENT:  Right Ear: External ear normal.  Left Ear: External ear normal.  Nose: Mucosal edema and rhinorrhea present. Right sinus exhibits no maxillary sinus tenderness and no frontal sinus tenderness. Left sinus exhibits no maxillary sinus tenderness and no frontal sinus tenderness.  Mouth/Throat: Oropharynx is clear and moist. No oropharyngeal exudate.  Eyes: Conjunctivae are normal. No scleral icterus.  Neck: Neck supple. No thyromegaly present.  Cardiovascular: Normal rate, regular rhythm and normal heart sounds.   Pulmonary/Chest: Effort normal. No respiratory distress. She has wheezes. She has no rales.  Lymphadenopathy:    She has no cervical adenopathy.          Assessment & Plan:  1. Seasonal allergies Continue Allegra.  Flonase 2 sprays each nostril daily. Temporarily, use prednisone taper pack to gain rapid control of her allergies to help calm down the wheezing in her lungs. - fluticasone (FLONASE) 50 MCG/ACT nasal spray; Place 2 sprays into both nostrils daily.  Dispense: 16 g; Refill: 6 -  predniSONE (DELTASONE) 20 MG tablet; 3 tabs poqday 1-2, 2 tabs poqday 3-4, 1 tab poqday 5-6  Dispense: 12 tablet; Refill: 0

## 2013-11-03 ENCOUNTER — Other Ambulatory Visit: Payer: Self-pay | Admitting: Family Medicine

## 2013-11-03 NOTE — Telephone Encounter (Signed)
Duplicate request

## 2013-11-03 NOTE — Telephone Encounter (Signed)
Refill appropriate and filled per protocol. 

## 2013-12-21 ENCOUNTER — Telehealth: Payer: Self-pay | Admitting: Family Medicine

## 2013-12-21 MED ORDER — ALENDRONATE SODIUM 70 MG PO TABS: 70.0000 mg | ORAL_TABLET | ORAL | Status: DC

## 2013-12-21 NOTE — Telephone Encounter (Signed)
[phone]  Pt is needing a refill on alendronate (FOSAMAX) 70 MG tablet ( would like a month supply)  [facility] [address]

## 2013-12-21 NOTE — Telephone Encounter (Signed)
Med sent to pharm 

## 2013-12-21 NOTE — Telephone Encounter (Signed)
Patient would 90 day supply instead of 30 day supply if possible

## 2014-01-23 ENCOUNTER — Ambulatory Visit (INDEPENDENT_AMBULATORY_CARE_PROVIDER_SITE_OTHER): Payer: Medicare Other | Admitting: Family Medicine

## 2014-01-23 DIAGNOSIS — Z23 Encounter for immunization: Secondary | ICD-10-CM

## 2014-03-25 ENCOUNTER — Other Ambulatory Visit: Payer: Self-pay

## 2014-03-25 DIAGNOSIS — Z1231 Encounter for screening mammogram for malignant neoplasm of breast: Secondary | ICD-10-CM

## 2014-04-01 ENCOUNTER — Ambulatory Visit (INDEPENDENT_AMBULATORY_CARE_PROVIDER_SITE_OTHER): Payer: Medicare Other | Admitting: Family Medicine

## 2014-04-01 ENCOUNTER — Encounter: Payer: Self-pay | Admitting: Family Medicine

## 2014-04-01 DIAGNOSIS — H6122 Impacted cerumen, left ear: Secondary | ICD-10-CM

## 2014-04-01 NOTE — Progress Notes (Signed)
   Subjective:    Patient ID: Shirley Bates, female    DOB: 1925/09/20, 81 y.o.   MRN: [id number]  HPI Patient refuses any vital signs to be performed today. She reports one week of pain in her left ear and also in her left ear. On examination today she has a cerumen impaction in the left ear completely occluding her left external auditory canal. Past Medical History  Diagnosis Date  . Hyperlipidemia   . Hypertension   . Basal cell carcinoma of leg     left 2014   No past surgical history on file. Current Outpatient Prescriptions on File Prior to Visit  Medication Sig Dispense Refill  . aspirin 81 MG tablet Take 81 mg by mouth daily.    . Calcium Carb-Cholecalciferol (CALCIUM 600 + D PO) Take 1 tablet by mouth 2 (two) times daily.    . fexofenadine (ALLEGRA) 180 MG tablet Take 180 mg by mouth daily.    . fluticasone (FLONASE) 50 MCG/ACT nasal spray Place 2 sprays into both nostrils daily. 16 g 6  . losartan (COZAAR) 100 MG tablet TAKE 1 TABLET BY MOUTH EVERY DAY 30 tablet 5  . multivitamin-iron-minerals-folic acid (CENTRUM) chewable tablet Chew 1 tablet by mouth daily.    . Omega-3 Fatty Acids (FISH OIL) 1200 MG CAPS Take 1 capsule by mouth 2 (two) times daily.    . simvastatin (ZOCOR) 20 MG tablet TAKE 1 TABLET BY MOUTH EVERY DAY AT BEDTIME 30 tablet 5  . alendronate (FOSAMAX) 70 MG tablet Take 1 tablet (70 mg total) by mouth every 7 (seven) days. Take with a full glass of water on an empty stomach. (Patient not taking: Reported on 04/01/2014) 12 tablet 3   No current facility-administered medications on file prior to visit.   No Known Allergies History   Social History  . Marital Status: Divorced    Spouse Name: N/A    Number of Children: N/A  . Years of Education: N/A   Occupational History  . Not on file.   Social History Main Topics  . Smoking status: Never Smoker   . Smokeless tobacco: Not on file  . Alcohol Use: No  . Drug Use: No  . Sexual Activity: No   Other  Topics Concern  . Not on file   Social History Narrative      Review of Systems  All other systems reviewed and are negative.      Objective:   Physical Exam  Cardiovascular: Normal rate and regular rhythm.   Pulmonary/Chest: Effort normal and breath sounds normal.  Vitals reviewed. Patient has a cerumen impaction in her left ear        Assessment & Plan:  Cerumen impaction, left  Left-sided cerumen impaction was removed with irrigation and lavage without complication and the patient's symptoms improved.

## 2014-04-26 ENCOUNTER — Ambulatory Visit
Admission: RE | Admit: 2014-04-26 | Discharge: 2014-04-26 | Disposition: A | Discharge status: Home / Self Care | Payer: Medicare Other | Source: Ambulatory Visit

## 2014-04-26 ENCOUNTER — Other Ambulatory Visit: Payer: Self-pay | Admitting: Family Medicine

## 2014-04-26 DIAGNOSIS — Z1231 Encounter for screening mammogram for malignant neoplasm of breast: Secondary | ICD-10-CM

## 2014-04-30 ENCOUNTER — Other Ambulatory Visit: Payer: Self-pay | Admitting: Family Medicine

## 2014-04-30 ENCOUNTER — Ambulatory Visit (INDEPENDENT_AMBULATORY_CARE_PROVIDER_SITE_OTHER): Payer: Medicare Other | Admitting: Family Medicine

## 2014-04-30 ENCOUNTER — Encounter: Payer: Self-pay | Admitting: Family Medicine

## 2014-04-30 VITALS — BP 162/84 | HR 80 | Temp 97.9°F | Resp 16 | Ht 61.25 in | Wt 113.0 lb

## 2014-04-30 DIAGNOSIS — I499 Cardiac arrhythmia, unspecified: Secondary | ICD-10-CM | POA: Diagnosis not present

## 2014-04-30 DIAGNOSIS — Z0000 Encounter for general adult medical examination without abnormal findings: Secondary | ICD-10-CM

## 2014-04-30 DIAGNOSIS — J208 Acute bronchitis due to other specified organisms: Secondary | ICD-10-CM | POA: Diagnosis not present

## 2014-04-30 DIAGNOSIS — R928 Other abnormal and inconclusive findings on diagnostic imaging of breast: Secondary | ICD-10-CM

## 2014-04-30 LAB — CBC WITH DIFFERENTIAL/PLATELET
BASOS ABS: 0 10*3/uL (ref 0.0–0.1)
Basophils Relative: 0 % (ref 0–1)
Eosinophils Absolute: 0.1 10*3/uL (ref 0.0–0.7)
Eosinophils Relative: 1 % (ref 0–5)
HEMATOCRIT: 38.5 % (ref 36.0–46.0)
Hemoglobin: 12.8 g/dL (ref 12.0–15.0)
LYMPHS ABS: 1.3 10*3/uL (ref 0.7–4.0)
LYMPHS PCT: 26 % (ref 12–46)
MCH: 31 pg (ref 26.0–34.0)
MCHC: 33.2 g/dL (ref 30.0–36.0)
MCV: 93.2 fL (ref 78.0–100.0)
MONO ABS: 0.4 10*3/uL (ref 0.1–1.0)
MONOS PCT: 7 % (ref 3–12)
MPV: 10.5 fL (ref 8.6–12.4)
NEUTROS ABS: 3.4 10*3/uL (ref 1.7–7.7)
Neutrophils Relative %: 66 % (ref 43–77)
PLATELETS: 249 10*3/uL (ref 150–400)
RBC: 4.13 MIL/uL (ref 3.87–5.11)
RDW: 14.7 % (ref 11.5–15.5)
WBC: 5.1 10*3/uL (ref 4.0–10.5)

## 2014-04-30 LAB — COMPLETE METABOLIC PANEL WITH GFR
ALT: 14 U/L (ref 0–35)
AST: 25 U/L (ref 0–37)
Albumin: 3.9 g/dL (ref 3.5–5.2)
Alkaline Phosphatase: 49 U/L (ref 39–117)
BUN: 9 mg/dL (ref 6–23)
CO2: 25 mEq/L (ref 19–32)
CREATININE: 0.61 mg/dL (ref 0.50–1.10)
Calcium: 9.2 mg/dL (ref 8.4–10.5)
Chloride: 100 mEq/L (ref 96–112)
GFR, Est African American: >89 mL/min
GFR, Est Non African American: 82 mL/min
Glucose, Bld: 124 mg/dL — ABNORMAL HIGH (ref 70–99)
Potassium: 3.9 mEq/L (ref 3.5–5.3)
Sodium: 137 mEq/L (ref 135–145)
Total Bilirubin: 0.4 mg/dL (ref 0.2–1.2)
Total Protein: 7.3 g/dL (ref 6.0–8.3)

## 2014-04-30 LAB — LIPID PANEL
CHOL/HDL RATIO: 2.8 ratio
Cholesterol: 183 mg/dL (ref 0–200)
HDL: 65 mg/dL (ref >39)
LDL Cholesterol: 99 mg/dL (ref 0–99)
TRIGLYCERIDES: 94 mg/dL (ref <150)
VLDL: 19 mg/dL (ref 0–40)

## 2014-04-30 LAB — TSH: TSH: 1.045 u[IU]/mL (ref 0.350–4.500)

## 2014-04-30 MED ORDER — SIMVASTATIN 20 MG PO TABS: 20.0000 mg | ORAL_TABLET | Freq: Every day | ORAL | Status: DC

## 2014-04-30 MED ORDER — LOSARTAN POTASSIUM 100 MG PO TABS: 100.0000 mg | ORAL_TABLET | Freq: Every day | ORAL | Status: DC

## 2014-04-30 MED ORDER — AZITHROMYCIN 250 MG PO TABS: ORAL_TABLET | ORAL | Status: DC

## 2014-04-30 MED ORDER — HYDROCODONE-HOMATROPINE 5-1.5 MG/5ML PO SYRP: 5.0000 mL | ORAL_SOLUTION | Freq: Three times a day (TID) | ORAL | Status: DC | PRN

## 2014-04-30 NOTE — Progress Notes (Signed)
Subjective:    Patient ID: Shirley Bates, female    DOB: 1925/10/19, 81 y.o.   MRN: [id number]  HPI Patient is here today for complete physical exam. Her mammogram was just performed Friday. There was a spot seen in the left breast and I have recommended a follow-up ultrasound although the patient has not scheduled that yet. She has had a chest cold for the last week. She's been taking decongestants including phenylephrine. She has a cough is productive of clear mucus. On examination today she has a rapid heartbeat which is irregularly irregular.  Patient is unable to feel that. She denies any chest pain shortness of breath or dyspnea on exertion. She is primarily concerned by the persistent cough. She denies any shortness of breath or fever. Patient has a history of white coat syndrome.  BP is typically <140/90 at home.  Could be exacerbated by phenylephrine.   EKG shows normal sinus rhythm at 80 bpm. There is no evidence of atrial fibrillation. There is no evidence of ischemia or infarction. There are nonspecific ST changes in leads 23 and aVF but no overt ischemia. Patient does have a PAC. On examination she does have an irregularly irregular rhythm but I believe I'm auscultating frequent PVCs. However the patient has extremely rhonchorous breath sounds throughout, she cannot stop coughing. Past Medical History  Diagnosis Date  . Hyperlipidemia   . Hypertension   . Basal cell carcinoma of leg     left 2014   No past surgical history on file. Current Outpatient Prescriptions on File Prior to Visit  Medication Sig Dispense Refill  . aspirin 81 MG tablet Take 81 mg by mouth daily.    . Calcium Carb-Cholecalciferol (CALCIUM 600 + D PO) Take 1 tablet by mouth 2 (two) times daily.    . fexofenadine (ALLEGRA) 180 MG tablet Take 180 mg by mouth daily.    . multivitamin-iron-minerals-folic acid (CENTRUM) chewable tablet Chew 1 tablet by mouth daily.     No current facility-administered medications  on file prior to visit.   No Known Allergies History   Social History  . Marital Status: Divorced    Spouse Name: N/A    Number of Children: N/A  . Years of Education: N/A   Occupational History  . Not on file.   Social History Main Topics  . Smoking status: Never Smoker   . Smokeless tobacco: Not on file  . Alcohol Use: No  . Drug Use: No  . Sexual Activity: No   Other Topics Concern  . Not on file   Social History Narrative   Family History  Problem Relation Age of Onset  . Heart disease Father   . Heart disease Sister   . Heart disease Brother   . Hypertension Brother       Review of Systems  All other systems reviewed and are negative.      Objective:   Physical Exam  Constitutional: She is oriented to person, place, and time. She appears well-developed and well-nourished. No distress.  HENT:  Head: Normocephalic and atraumatic.  Right Ear: External ear normal.  Left Ear: External ear normal.  Nose: Nose normal.  Mouth/Throat: Oropharynx is clear and moist. No oropharyngeal exudate.  Eyes: Conjunctivae and EOM are normal. Pupils are equal, round, and reactive to light. Right eye exhibits no discharge. Left eye exhibits no discharge. No scleral icterus.  Neck: Normal range of motion. Neck supple. No thyromegaly present.  Cardiovascular: Normal heart sounds.  An  irregularly irregular rhythm present. Tachycardia present.   Pulmonary/Chest: Effort normal and breath sounds normal.  Abdominal: Soft. Bowel sounds are normal. She exhibits no distension. There is no tenderness. There is no rebound and no guarding.  Musculoskeletal: She exhibits no edema.  Lymphadenopathy:    She has no cervical adenopathy.  Neurological: She is alert and oriented to person, place, and time. She displays normal reflexes. No cranial nerve deficit. She exhibits normal muscle tone. Coordination normal.  Skin: Skin is warm. No rash noted. She is not diaphoretic. No erythema. No  pallor.  Psychiatric: She has a normal mood and affect. Her behavior is normal. Judgment and thought content normal.  Vitals reviewed.         Assessment & Plan:  Routine general medical examination at a health care facility - Plan: COMPLETE METABOLIC PANEL WITH GFR, CBC with Differential/Platelet, Lipid panel  Irregular heartbeat - Plan: EKG 12-Lead, TSH  Acute bronchitis due to other specified organisms - Plan: azithromycin (ZITHROMAX) 250 MG tablet, HYDROcodone-homatropine (HYCODAN) 5-1.5 MG/5ML syrup  Patient for the patient is not in atrial fibrillation. I believe she is having frequent PVCs which are probably exacerbated by the phenylephrine. I recommended she discontinue phenylephrine. I will check a TSH. I will aggressively treat her bronchitis with a Z-Pak as well as cough medication. Recheck in 48 hours if no better or immediately if worse. Given her age her cancer screening and immunizations are up-to-date. I did recommend that she follow-up with the breast center to schedule the ultrasound to evaluate the left breast mass. Blood pressure is elevated as it always is in the office. Her home blood pressures are better. I believe her blood pressure is also exacerbated by the phenylephrine

## 2014-05-02 LAB — HEMOGLOBIN A1C
HEMOGLOBIN A1C: 5.8 % — AB (ref <5.7)
MEAN PLASMA GLUCOSE: 120 mg/dL — AB (ref <117)

## 2014-05-07 ENCOUNTER — Ambulatory Visit
Admission: RE | Admit: 2014-05-07 | Discharge: 2014-05-07 | Disposition: A | Discharge status: Home / Self Care | Payer: Medicare Other | Source: Ambulatory Visit | Attending: Family Medicine | Admitting: Family Medicine

## 2014-05-07 DIAGNOSIS — R928 Other abnormal and inconclusive findings on diagnostic imaging of breast: Secondary | ICD-10-CM

## 2014-05-08 ENCOUNTER — Telehealth: Payer: Self-pay | Admitting: Family Medicine

## 2014-05-08 NOTE — Telephone Encounter (Signed)
[phone] Pt has called as Shirley Bates that she had her other mammogram done yesterday and everything was fine.

## 2014-05-14 ENCOUNTER — Other Ambulatory Visit: Payer: Self-pay | Admitting: Family Medicine

## 2014-05-14 ENCOUNTER — Telehealth: Payer: Self-pay | Admitting: Family Medicine

## 2014-05-14 MED ORDER — SIMVASTATIN 20 MG PO TABS: 20.0000 mg | ORAL_TABLET | Freq: Every day | ORAL | Status: DC

## 2014-05-14 MED ORDER — LOSARTAN POTASSIUM 100 MG PO TABS: 100.0000 mg | ORAL_TABLET | Freq: Every day | ORAL | Status: DC

## 2014-05-14 NOTE — Telephone Encounter (Signed)
[phone] PT is wanting to speak to you about her medication she [facility] and she didn't call any in and she is wanting to know if dr pickard called any more in

## 2014-05-14 NOTE — Telephone Encounter (Signed)
Meds sent to [facility] as request by pt

## 2014-05-15 NOTE — Telephone Encounter (Signed)
Called pt and she states that [facility] another [facility] and this has been taken care of so there was nothing for [place] to do with it.

## 2014-06-12 ENCOUNTER — Telehealth: Payer: Self-pay | Admitting: *Deleted

## 2014-06-12 ENCOUNTER — Ambulatory Visit (INDEPENDENT_AMBULATORY_CARE_PROVIDER_SITE_OTHER): Payer: Medicare Other | Admitting: Family Medicine

## 2014-06-12 ENCOUNTER — Encounter: Payer: Self-pay | Admitting: Family Medicine

## 2014-06-12 VITALS — BP 132/78 | HR 68 | Temp 97.8°F | Resp 16 | Ht 62.0 in | Wt 113.0 lb

## 2014-06-12 DIAGNOSIS — H9202 Otalgia, left ear: Secondary | ICD-10-CM

## 2014-06-12 DIAGNOSIS — H9192 Unspecified hearing loss, left ear: Secondary | ICD-10-CM | POA: Diagnosis not present

## 2014-06-12 MED ORDER — CIPROFLOXACIN-DEXAMETHASONE 0.3-0.1 % OT SUSP: 4.0000 [drp] | Freq: Two times a day (BID) | OTIC | Status: DC

## 2014-06-12 NOTE — Patient Instructions (Signed)
Use ear drop as prescribed Get the hearing exam done F/U as previous with Dr. Dennard Schaumann

## 2014-06-12 NOTE — Telephone Encounter (Signed)
Received fax from pharmacy.   Reports that Cipro/steroid ear drops are not covered by insurance.   Advised that comparable medications are cortisporin ear drops or Cipro eye drops that can be used in the ear.   MD please advise.

## 2014-06-12 NOTE — Progress Notes (Signed)
Patient ID: Shirley Bates, female   DOB: 1925/06/26, 81 y.o.   MRN: [id number]   Subjective:    Patient ID: Shirley Bates, female    DOB: 16-Dec-1925, 81 y.o.   MRN: [id number]  Patient presents for Ear Pain  patient here with left ear pain which has been persistent for the past 6 weeks. She did come in in January and has cerumen impaction however after that was relieved she still had pain. She also has had decreased hearing in her left ear but that has been going on for the past couple years. She is getting herself set up to have an audiology exam done. She states if she turns her head a certain way she will get the sharp pain into her left ear other times when she moves her head she does not have any problems. She's not had any sinus drainage or fever. Peroxide drops help   Review Of Systems: per above   GEN- denies fatigue, fever, weight loss,weakness, recent illness HEENT- denies eye drainage, change in vision, nasal discharge, CVS- denies chest pain, palpitations RESP- denies SOB, cough, wheeze MSK- + joint pain, muscle aches, injury Neuro- denies headache, dizziness, syncope, seizure activity       Objective:    BP 132/78 mmHg  Pulse 68  Temp(Src) 97.8 F (36.6 C) (Oral)  Resp 16  Ht 5\' 2"  (1.575 m)  Wt 113 lb (51.256 kg)  BMI 20.66 kg/m2 GEN- NAD, alert and oriented x3 HEENT- PERRL, EOMI, non injected sclera, pink conjunctiva, MMM, oropharynx clear, Bilat canals mild wax, mild erythema left canalTM clear no effusion, nares clear no LAD Neck- stiff decreased ROM, no LAD          Assessment & Plan:      Problem List Items Addressed This Visit    None    Visit Diagnoses    Otalgia of left ear    -  Primary    I dont see any overwhelming infection or swelling, will treat with ear drop, and see how she does. She also defintely has OA/DDD in neck this could be causing pain but strange only in ear    Hearing problem of left ear        With age expeced, she has  audiology exam set up       Note: This dictation was prepared with Dragon dictation along with smaller phrase technology. Any transcriptional errors that result from this process are unintentional.

## 2014-06-12 NOTE — Telephone Encounter (Signed)
Call placed to pharmacy and new orders given.

## 2014-06-12 NOTE — Telephone Encounter (Signed)
Okay to  Give eye drop in ear

## 2015-01-21 ENCOUNTER — Ambulatory Visit (INDEPENDENT_AMBULATORY_CARE_PROVIDER_SITE_OTHER): Payer: Medicare Other | Admitting: Family Medicine

## 2015-01-21 DIAGNOSIS — Z23 Encounter for immunization: Secondary | ICD-10-CM

## 2015-05-09 ENCOUNTER — Other Ambulatory Visit: Payer: Self-pay

## 2015-05-09 DIAGNOSIS — Z1231 Encounter for screening mammogram for malignant neoplasm of breast: Secondary | ICD-10-CM

## 2015-05-15 ENCOUNTER — Other Ambulatory Visit: Payer: Self-pay | Admitting: *Deleted

## 2015-05-15 ENCOUNTER — Encounter: Payer: Self-pay | Admitting: *Deleted

## 2015-05-15 MED ORDER — SIMVASTATIN 20 MG PO TABS
20.0000 mg | ORAL_TABLET | Freq: Every day | ORAL | Status: DC
Start: 2015-05-15 — End: 2015-08-12

## 2015-05-15 MED ORDER — LOSARTAN POTASSIUM 100 MG PO TABS: 100.0000 mg | ORAL_TABLET | Freq: Every day | ORAL | Status: DC

## 2015-05-15 NOTE — Telephone Encounter (Signed)
Received fax requesting refill on Zocor and Losartan.   Medication filled x1 with no refills.   Requires office visit before any further refills can be given.   Letter sent.

## 2015-05-19 ENCOUNTER — Encounter: Payer: Self-pay | Admitting: Family Medicine

## 2015-05-19 ENCOUNTER — Ambulatory Visit (INDEPENDENT_AMBULATORY_CARE_PROVIDER_SITE_OTHER): Payer: Medicare Other | Admitting: Family Medicine

## 2015-05-19 VITALS — BP 136/72 | HR 60 | Temp 98.3°F | Resp 14 | Ht 61.25 in | Wt 114.0 lb

## 2015-05-19 DIAGNOSIS — M810 Age-related osteoporosis without current pathological fracture: Secondary | ICD-10-CM | POA: Diagnosis not present

## 2015-05-19 DIAGNOSIS — Z0000 Encounter for general adult medical examination without abnormal findings: Secondary | ICD-10-CM

## 2015-05-19 DIAGNOSIS — I10 Essential (primary) hypertension: Secondary | ICD-10-CM | POA: Diagnosis not present

## 2015-05-19 DIAGNOSIS — E785 Hyperlipidemia, unspecified: Secondary | ICD-10-CM | POA: Diagnosis not present

## 2015-05-19 LAB — COMPLETE METABOLIC PANEL WITH GFR
ALBUMIN: 3.8 g/dL (ref 3.6–5.1)
ALK PHOS: 54 U/L (ref 33–130)
ALT: 14 U/L (ref 6–29)
AST: 24 U/L (ref 10–35)
BUN: 14 mg/dL (ref 7–25)
CO2: 28 mmol/L (ref 20–31)
Calcium: 9.4 mg/dL (ref 8.6–10.4)
Chloride: 103 mmol/L (ref 98–110)
Creat: 0.74 mg/dL (ref 0.60–0.88)
GFR, Est African American: 84 mL/min (ref >=60)
GFR, Est Non African American: 73 mL/min (ref >=60)
GLUCOSE: 94 mg/dL (ref 70–99)
POTASSIUM: 3.9 mmol/L (ref 3.5–5.3)
Sodium: 139 mmol/L (ref 135–146)
TOTAL PROTEIN: 6.9 g/dL (ref 6.1–8.1)
Total Bilirubin: 0.6 mg/dL (ref 0.2–1.2)

## 2015-05-19 LAB — CBC WITH DIFFERENTIAL/PLATELET
BASOS PCT: 0 % (ref 0–1)
Basophils Absolute: 0 10*3/uL (ref 0.0–0.1)
Eosinophils Absolute: 0.1 10*3/uL (ref 0.0–0.7)
Eosinophils Relative: 2 % (ref 0–5)
HCT: 38.6 % (ref 36.0–46.0)
Hemoglobin: 12.9 g/dL (ref 12.0–15.0)
Lymphocytes Relative: 27 % (ref 12–46)
Lymphs Abs: 1.3 10*3/uL (ref 0.7–4.0)
MCH: 31.2 pg (ref 26.0–34.0)
MCHC: 33.4 g/dL (ref 30.0–36.0)
MCV: 93.2 fL (ref 78.0–100.0)
MPV: 10.9 fL (ref 8.6–12.4)
Monocytes Absolute: 0.5 10*3/uL (ref 0.1–1.0)
Monocytes Relative: 11 % (ref 3–12)
NEUTROS PCT: 60 % (ref 43–77)
Neutro Abs: 2.9 10*3/uL (ref 1.7–7.7)
Platelets: 224 10*3/uL (ref 150–400)
RBC: 4.14 MIL/uL (ref 3.87–5.11)
RDW: 14.4 % (ref 11.5–15.5)
WBC: 4.8 10*3/uL (ref 4.0–10.5)

## 2015-05-19 LAB — LIPID PANEL
Cholesterol: 176 mg/dL (ref 125–200)
HDL: 81 mg/dL (ref >=46)
LDL Cholesterol: 82 mg/dL (ref <130)
Total CHOL/HDL Ratio: 2.2 Ratio (ref <=5.0)
Triglycerides: 67 mg/dL (ref <150)
VLDL: 13 mg/dL (ref <30)

## 2015-05-19 NOTE — Progress Notes (Signed)
Subjective:    Patient ID: Shirley Bates, female    DOB: Jun 30, 1925, 82 y.o.   MRN: [id number]  HPI  Patient is here today for complete physical exam. On her exam today she continues to have occasional PVCs but they are asymptomatic. She discontinued Fosamax due to GI upset. At the present time she is only taking calcium and vitamin D for her osteoporosis although she would be interested in Prolia.  She is due for the shingles vaccine but she refuses this.  She has a mammogram scheduled for later this month. Immunization History  Administered Date(s) Administered  . Influenza,inj,Quad PF,36+ Mos 12/12/2012, 01/23/2014, 01/21/2015  . Pneumococcal Conjugate-13 04/27/2013  . Pneumococcal Polysaccharide-23 03/13/2003  . Td 12/31/2008   Mammogram 2/16 DEXA 2015-osteoporosis  Past Medical History  Diagnosis Date  . Hyperlipidemia   . Hypertension   . Basal cell carcinoma of leg     left 2014   No past surgical history on file. Current Outpatient Prescriptions on File Prior to Visit  Medication Sig Dispense Refill  . alendronate (FOSAMAX) 70 MG tablet   11  . aspirin 81 MG tablet Take 81 mg by mouth daily.    . Calcium Carb-Cholecalciferol (CALCIUM 600 + D PO) Take 1 tablet by mouth 2 (two) times daily.    . fexofenadine (ALLEGRA) 180 MG tablet Take 180 mg by mouth daily.    Marland Kitchen losartan (COZAAR) 100 MG tablet Take 1 tablet (100 mg total) by mouth daily. 90 tablet 0  . multivitamin-iron-minerals-folic acid (CENTRUM) chewable tablet Chew 1 tablet by mouth daily.    . simvastatin (ZOCOR) 20 MG tablet Take 1 tablet (20 mg total) by mouth at bedtime. 90 tablet 0   No current facility-administered medications on file prior to visit.   Allergies  Allergen Reactions  . Penicillins    Social History   Social History  . Marital Status: Divorced    Spouse Name: N/A  . Number of Children: N/A  . Years of Education: N/A   Occupational History  . Not on file.   Social History Main  Topics  . Smoking status: Never Smoker   . Smokeless tobacco: Not on file  . Alcohol Use: No  . Drug Use: No  . Sexual Activity: No   Other Topics Concern  . Not on file   Social History Narrative   Family History  Problem Relation Age of Onset  . Heart disease Father   . Heart disease Sister   . Heart disease Brother   . Hypertension Brother       Review of Systems  All other systems reviewed and are negative.      Objective:   Physical Exam  Constitutional: She is oriented to person, place, and time. She appears well-developed and well-nourished. No distress.  HENT:  Head: Normocephalic and atraumatic.  Right Ear: External ear normal.  Left Ear: External ear normal.  Nose: Nose normal.  Mouth/Throat: Oropharynx is clear and moist. No oropharyngeal exudate.  Eyes: Conjunctivae and EOM are normal. Pupils are equal, round, and reactive to light. Right eye exhibits no discharge. Left eye exhibits no discharge. No scleral icterus.  Neck: Normal range of motion. Neck supple. No thyromegaly present.  Cardiovascular: Normal rate, regular rhythm and normal heart sounds.   Pulmonary/Chest: Effort normal and breath sounds normal.  Abdominal: Soft. Bowel sounds are normal. She exhibits no distension. There is no tenderness. There is no rebound and no guarding.  Musculoskeletal: She exhibits no  edema.  Lymphadenopathy:    She has no cervical adenopathy.  Neurological: She is alert and oriented to person, place, and time. She displays normal reflexes. No cranial nerve deficit. She exhibits normal muscle tone. Coordination normal.  Skin: Skin is warm. No rash noted. She is not diaphoretic. No erythema. No pallor.  Psychiatric: She has a normal mood and affect. Her behavior is normal. Judgment and thought content normal.  Vitals reviewed.         Assessment & Plan:  Routine general medical examination at a health care facility - Plan: CBC with Differential/Platelet, COMPLETE  METABOLIC PANEL WITH GFR, Lipid panel  Benign essential HTN - Plan: COMPLETE METABOLIC PANEL WITH GFR, Lipid panel  HLD (hyperlipidemia) - Plan: Lipid panel  Osteoporosis  Begin prolia every 6 months.  Recommended the shingles vaccine but she declined. Blood pressures acceptable. I will check a CBC, CMP, fasting lipid panel. She has a mammogram scheduled for later this month. Due to her age she does not require a colonoscopy or Pap smear.

## 2015-05-20 ENCOUNTER — Encounter: Payer: Self-pay | Admitting: Family Medicine

## 2015-05-23 ENCOUNTER — Ambulatory Visit
Admission: RE | Admit: 2015-05-23 | Discharge: 2015-05-23 | Disposition: A | Discharge status: Home / Self Care | Payer: Medicare Other | Source: Ambulatory Visit

## 2015-05-23 DIAGNOSIS — Z1231 Encounter for screening mammogram for malignant neoplasm of breast: Secondary | ICD-10-CM | POA: Diagnosis not present

## 2015-05-28 ENCOUNTER — Encounter: Payer: Self-pay | Admitting: Family Medicine

## 2015-05-28 ENCOUNTER — Telehealth: Payer: Self-pay | Admitting: Family Medicine

## 2015-05-28 DIAGNOSIS — M810 Age-related osteoporosis without current pathological fracture: Secondary | ICD-10-CM | POA: Insufficient documentation

## 2015-05-28 NOTE — Telephone Encounter (Signed)
Called pt to inform we have received all the information and insurance verification to proceed with the Prolia injection.  Pt has an annual deductible which has not been met which means she has a 20% co-pay on the Prolia.  Pt says she does not have that and is declining Prolia at this time.  Will call back if changes her mind.

## 2015-05-29 NOTE — Telephone Encounter (Signed)
Noted! Thank you

## 2015-08-12 ENCOUNTER — Other Ambulatory Visit: Payer: Self-pay | Admitting: Family Medicine

## 2015-08-12 MED ORDER — LOSARTAN POTASSIUM 100 MG PO TABS: 100.0000 mg | ORAL_TABLET | Freq: Every day | ORAL | Status: DC

## 2015-08-12 MED ORDER — SIMVASTATIN 20 MG PO TABS: 20.0000 mg | ORAL_TABLET | Freq: Every day | ORAL | Status: DC

## 2015-08-12 NOTE — Telephone Encounter (Signed)
Medication called/sent to requested pharmacy  

## 2016-02-02 ENCOUNTER — Ambulatory Visit (INDEPENDENT_AMBULATORY_CARE_PROVIDER_SITE_OTHER): Payer: Medicare Other | Admitting: Family Medicine

## 2016-02-02 DIAGNOSIS — Z23 Encounter for immunization: Secondary | ICD-10-CM | POA: Diagnosis not present

## 2016-04-19 ENCOUNTER — Other Ambulatory Visit: Payer: Self-pay | Admitting: Family Medicine

## 2016-04-19 DIAGNOSIS — Z1231 Encounter for screening mammogram for malignant neoplasm of breast: Secondary | ICD-10-CM

## 2016-05-21 ENCOUNTER — Encounter: Payer: Medicare Other | Admitting: Family Medicine

## 2016-05-24 ENCOUNTER — Ambulatory Visit
Admission: RE | Admit: 2016-05-24 | Discharge: 2016-05-24 | Disposition: A | Discharge status: Home / Self Care | Payer: Medicare Other | Source: Ambulatory Visit | Attending: Family Medicine | Admitting: Family Medicine

## 2016-05-24 DIAGNOSIS — Z1231 Encounter for screening mammogram for malignant neoplasm of breast: Secondary | ICD-10-CM | POA: Diagnosis not present

## 2016-05-31 ENCOUNTER — Ambulatory Visit (INDEPENDENT_AMBULATORY_CARE_PROVIDER_SITE_OTHER): Payer: Medicare Other | Admitting: Family Medicine

## 2016-05-31 ENCOUNTER — Encounter: Payer: Self-pay | Admitting: Family Medicine

## 2016-05-31 VITALS — BP 170/100 | HR 82 | Temp 97.9°F | Resp 16 | Ht 61.25 in | Wt 113.0 lb

## 2016-05-31 DIAGNOSIS — E7800 Pure hypercholesterolemia, unspecified: Secondary | ICD-10-CM

## 2016-05-31 DIAGNOSIS — Z0000 Encounter for general adult medical examination without abnormal findings: Secondary | ICD-10-CM | POA: Diagnosis not present

## 2016-05-31 DIAGNOSIS — M810 Age-related osteoporosis without current pathological fracture: Secondary | ICD-10-CM | POA: Diagnosis not present

## 2016-05-31 DIAGNOSIS — I10 Essential (primary) hypertension: Secondary | ICD-10-CM | POA: Diagnosis not present

## 2016-05-31 LAB — CBC WITH DIFFERENTIAL/PLATELET
Basophils Absolute: 0 cells/uL (ref 0–200)
Basophils Relative: 0 %
EOS ABS: 54 {cells}/uL (ref 15–500)
Eosinophils Relative: 1 %
HEMATOCRIT: 39.5 % (ref 35.0–45.0)
HEMOGLOBIN: 12.8 g/dL (ref 12.0–15.0)
LYMPHS ABS: 1404 {cells}/uL (ref 850–3900)
LYMPHS PCT: 26 %
MCH: 30.5 pg (ref 27.0–33.0)
MCHC: 32.4 g/dL (ref 32.0–36.0)
MCV: 94 fL (ref 80.0–100.0)
MONO ABS: 594 {cells}/uL (ref 200–950)
MPV: 10.6 fL (ref 7.5–12.5)
Monocytes Relative: 11 %
NEUTROS ABS: 3348 {cells}/uL (ref 1500–7800)
Neutrophils Relative %: 62 %
Platelets: 225 10*3/uL (ref 140–400)
RBC: 4.2 MIL/uL (ref 3.80–5.10)
RDW: 13.7 % (ref 11.0–15.0)
WBC: 5.4 10*3/uL (ref 3.8–10.8)

## 2016-05-31 LAB — COMPLETE METABOLIC PANEL WITH GFR
ALBUMIN: 3.9 g/dL (ref 3.6–5.1)
ALT: 14 U/L (ref 6–29)
AST: 23 U/L (ref 10–35)
Alkaline Phosphatase: 50 U/L (ref 33–130)
BILIRUBIN TOTAL: 0.5 mg/dL (ref 0.2–1.2)
BUN: 12 mg/dL (ref 7–25)
CALCIUM: 9.2 mg/dL (ref 8.6–10.4)
CO2: 28 mmol/L (ref 20–31)
Chloride: 101 mmol/L (ref 98–110)
Creat: 0.75 mg/dL (ref 0.60–0.88)
GFR, EST AFRICAN AMERICAN: 82 mL/min (ref >=60)
GFR, Est Non African American: 71 mL/min (ref >=60)
Glucose, Bld: 91 mg/dL (ref 70–99)
Potassium: 4 mmol/L (ref 3.5–5.3)
Sodium: 138 mmol/L (ref 135–146)
TOTAL PROTEIN: 7 g/dL (ref 6.1–8.1)

## 2016-05-31 LAB — LIPID PANEL
CHOLESTEROL: 172 mg/dL (ref <200)
HDL: 77 mg/dL (ref >50)
LDL Cholesterol: 79 mg/dL (ref <100)
TRIGLYCERIDES: 81 mg/dL (ref <150)
Total CHOL/HDL Ratio: 2.2 Ratio (ref <5.0)
VLDL: 16 mg/dL (ref <30)

## 2016-05-31 MED ORDER — ALENDRONATE SODIUM 70 MG PO TABS: 70.0000 mg | ORAL_TABLET | ORAL | 11 refills | Status: DC

## 2016-05-31 NOTE — Progress Notes (Signed)
Subjective:    Patient ID: Shirley Bates, female    DOB: 1925/08/19, 83 y.o.   MRN: [id number]  HPI Patient is here today for complete physical exam. Her mammogram is up-to-date. Because of her age, she does not require a Pap smear or colonoscopy because of her age. She has a history of severe osteoporosis on bone density from 2015. She stopped Fosamax because of stomach upset. However after a long discussion today, she would be willing to try this again. Her last mammogram was in March of this year and was excellent. She is due for the shingles vaccine Immunization History  Administered Date(s) Administered  . Influenza,inj,Quad PF,36+ Mos 12/12/2012, 01/23/2014, 01/21/2015, 02/02/2016  . Pneumococcal Conjugate-13 04/27/2013  . Pneumococcal Polysaccharide-23 03/13/2003  . Td 12/31/2008   DEXA 2015-osteoporosis  Past Medical History:  Diagnosis Date  . Basal cell carcinoma of leg    left 2014  . Hyperlipidemia   . Hypertension    No past surgical history on file. Current Outpatient Prescriptions on File Prior to Visit  Medication Sig Dispense Refill  . aspirin 81 MG tablet Take 81 mg by mouth daily.    . Calcium Carb-Cholecalciferol (CALCIUM 600 + D PO) Take 1 tablet by mouth 2 (two) times daily.    . fexofenadine (ALLEGRA) 180 MG tablet Take 180 mg by mouth daily.    Marland Kitchen losartan (COZAAR) 100 MG tablet Take 1 tablet (100 mg total) by mouth daily. 90 tablet 3  . multivitamin-iron-minerals-folic acid (CENTRUM) chewable tablet Chew 1 tablet by mouth daily.    . simvastatin (ZOCOR) 20 MG tablet Take 1 tablet (20 mg total) by mouth at bedtime. 90 tablet 3   No current facility-administered medications on file prior to visit.    Allergies  Allergen Reactions  . Penicillins    Social History   Social History  . Marital status: Divorced    Spouse name: N/A  . Number of children: N/A  . Years of education: N/A   Occupational History  . Not on file.   Social History Main  Topics  . Smoking status: Never Smoker  . Smokeless tobacco: Never Used  . Alcohol use No  . Drug use: No  . Sexual activity: No   Other Topics Concern  . Not on file   Social History Narrative  . No narrative on file   Family History  Problem Relation Age of Onset  . Heart disease Father   . Heart disease Sister   . Heart disease Brother   . Hypertension Brother   . Breast cancer Neg Hx       Review of Systems  All other systems reviewed and are negative.      Objective:   Physical Exam  Constitutional: She is oriented to person, place, and time. She appears well-developed and well-nourished. No distress.  HENT:  Head: Normocephalic and atraumatic.  Right Ear: External ear normal.  Left Ear: External ear normal.  Nose: Nose normal.  Mouth/Throat: Oropharynx is clear and moist. No oropharyngeal exudate.  Eyes: Conjunctivae and EOM are normal. Pupils are equal, round, and reactive to light. Right eye exhibits no discharge. Left eye exhibits no discharge. No scleral icterus.  Neck: Normal range of motion. Neck supple. No thyromegaly present.  Cardiovascular: Normal rate, regular rhythm and normal heart sounds.   Pulmonary/Chest: Effort normal and breath sounds normal.  Abdominal: Soft. Bowel sounds are normal. She exhibits no distension. There is no tenderness. There is no rebound and  no guarding.  Musculoskeletal: She exhibits no edema.  Lymphadenopathy:    She has no cervical adenopathy.  Neurological: She is alert and oriented to person, place, and time. She displays normal reflexes. No cranial nerve deficit. She exhibits normal muscle tone. Coordination normal.  Skin: Skin is warm. No rash noted. She is not diaphoretic. No erythema. No pallor.  Psychiatric: She has a normal mood and affect. Her behavior is normal. Judgment and thought content normal.  Vitals reviewed.         Assessment & Plan:  Benign essential HTN - Plan: CBC with Differential/Platelet,  COMPLETE METABOLIC PANEL WITH GFR, Lipid panel  Routine general medical examination at a health care facility  Pure hypercholesterolemia  Age-related osteoporosis without current pathological fracture  Patient never did the prolia apparently. Therefore I want her to resume Fosamax 70 mg weekly. Mammogram is up-to-date. I would not recommend a Pap smear or colonoscopy. We discussed the shingles vaccine. I will check a CBC, CMP, and a fasting lipid panel. Her blood pressure today is elevated however she has a well-documented history of white coat syndrome with normal blood pressures at home

## 2016-06-01 ENCOUNTER — Encounter: Payer: Self-pay | Admitting: Family Medicine

## 2016-06-16 ENCOUNTER — Other Ambulatory Visit: Payer: Self-pay | Admitting: Family Medicine

## 2016-08-27 ENCOUNTER — Other Ambulatory Visit: Payer: Self-pay | Admitting: Family Medicine

## 2016-08-27 MED ORDER — LOSARTAN POTASSIUM 100 MG PO TABS: 100.0000 mg | ORAL_TABLET | Freq: Every day | ORAL | 3 refills | Status: DC

## 2016-08-27 MED ORDER — SIMVASTATIN 20 MG PO TABS: 20.0000 mg | ORAL_TABLET | Freq: Every day | ORAL | 3 refills | Status: DC

## 2016-08-27 NOTE — Telephone Encounter (Signed)
Pharm requested Zocor and losartan - Medication called/sent to requested pharmacy

## 2017-05-02 ENCOUNTER — Other Ambulatory Visit: Payer: Self-pay | Admitting: Family Medicine

## 2017-05-02 DIAGNOSIS — Z1231 Encounter for screening mammogram for malignant neoplasm of breast: Secondary | ICD-10-CM

## 2017-06-03 ENCOUNTER — Encounter: Payer: Self-pay | Admitting: Family Medicine

## 2017-06-03 ENCOUNTER — Ambulatory Visit: Payer: Medicare Other

## 2017-06-03 ENCOUNTER — Ambulatory Visit (INDEPENDENT_AMBULATORY_CARE_PROVIDER_SITE_OTHER): Payer: Medicare Other | Admitting: Family Medicine

## 2017-06-03 VITALS — BP 160/80 | HR 92 | Temp 98.0°F | Resp 16 | Ht 61.25 in | Wt 110.0 lb

## 2017-06-03 DIAGNOSIS — Z0000 Encounter for general adult medical examination without abnormal findings: Secondary | ICD-10-CM | POA: Diagnosis not present

## 2017-06-03 DIAGNOSIS — G6289 Other specified polyneuropathies: Secondary | ICD-10-CM

## 2017-06-03 DIAGNOSIS — I10 Essential (primary) hypertension: Secondary | ICD-10-CM

## 2017-06-03 DIAGNOSIS — H6122 Impacted cerumen, left ear: Secondary | ICD-10-CM

## 2017-06-03 DIAGNOSIS — E7800 Pure hypercholesterolemia, unspecified: Secondary | ICD-10-CM | POA: Diagnosis not present

## 2017-06-03 DIAGNOSIS — M810 Age-related osteoporosis without current pathological fracture: Secondary | ICD-10-CM | POA: Diagnosis not present

## 2017-06-03 NOTE — Progress Notes (Signed)
Subjective:    Patient ID: Shirley Bates, female    DOB: 1926-03-22, 84 y.o.   MRN: [id number]  HPI  Patient is here today for complete physical exam. . Because of her age, she does not require a Pap smear, mammogram, or colonoscopy because of her age. She has a history of severe osteoporosis on bone density from 2015. She stopped Fosamax because of stomach upset.  She also reports pins and needles and stinging pain in the soles of her feet.  She reports numbness in the soles of her feet concerning for peripheral neuropathy.  She is not walking with a cane.  She has not fallen but she is a high fall risk.  She is no longer driving.  She is suffering from age-related memory decline.  She is due for the shingles vaccine Immunization History  Administered Date(s) Administered  . Influenza,inj,Quad PF,6+ Mos 12/12/2012, 01/23/2014, 01/21/2015, 02/02/2016  . Influenza-Unspecified 01/07/2017  . Pneumococcal Conjugate-13 04/27/2013  . Pneumococcal Polysaccharide-23 03/13/2003  . Td 12/31/2008   DEXA 2015-osteoporosis  Past Medical History:  Diagnosis Date  . Basal cell carcinoma of leg    left 2014  . Hyperlipidemia   . Hypertension    No past surgical history on file. Current Outpatient Medications on File Prior to Visit  Medication Sig Dispense Refill  . aspirin 81 MG tablet Take 81 mg by mouth daily.    . Calcium Carb-Cholecalciferol (CALCIUM 600 + D PO) Take 1 tablet by mouth 2 (two) times daily.    Marland Kitchen losartan (COZAAR) 100 MG tablet Take 1 tablet (100 mg total) by mouth daily. 90 tablet 3  . multivitamin-iron-minerals-folic acid (CENTRUM) chewable tablet Chew 1 tablet by mouth daily.    . Omega-3 Fatty Acids (FISH OIL) 1200 MG CAPS Take by mouth.    . simvastatin (ZOCOR) 20 MG tablet Take 1 tablet (20 mg total) by mouth at bedtime. 90 tablet 3   No current facility-administered medications on file prior to visit.    Allergies  Allergen Reactions  . Penicillins    Social History    Socioeconomic History  . Marital status: Divorced    Spouse name: Not on file  . Number of children: Not on file  . Years of education: Not on file  . Highest education level: Not on file  Social Needs  . Financial resource strain: Not on file  . Food insecurity - worry: Not on file  . Food insecurity - inability: Not on file  . Transportation needs - medical: Not on file  . Transportation needs - non-medical: Not on file  Occupational History  . Not on file  Tobacco Use  . Smoking status: Never Smoker  . Smokeless tobacco: Never Used  Substance and Sexual Activity  . Alcohol use: No  . Drug use: No  . Sexual activity: No  Other Topics Concern  . Not on file  Social History Narrative  . Not on file   Family History  Problem Relation Age of Onset  . Heart disease Father   . Heart disease Sister   . Heart disease Brother   . Hypertension Brother   . Breast cancer Neg Hx       Review of Systems  All other systems reviewed and are negative.      Objective:   Physical Exam  Constitutional: She is oriented to person, place, and time. She appears well-developed and well-nourished. No distress.  HENT:  Head: Normocephalic and atraumatic.  Right Ear:  External ear normal.  Left Ear: External ear normal.  Nose: Nose normal.  Mouth/Throat: Oropharynx is clear and moist. No oropharyngeal exudate.  Eyes: Conjunctivae and EOM are normal. Pupils are equal, round, and reactive to light. Right eye exhibits no discharge. Left eye exhibits no discharge. No scleral icterus.  Neck: Normal range of motion. Neck supple. No thyromegaly present.  Cardiovascular: Normal rate, regular rhythm and normal heart sounds.  Pulmonary/Chest: Effort normal and breath sounds normal.  Abdominal: Soft. Bowel sounds are normal. She exhibits no distension. There is no tenderness. There is no rebound and no guarding.  Musculoskeletal: She exhibits no edema.  Lymphadenopathy:    She has no cervical  adenopathy.  Neurological: She is alert and oriented to person, place, and time. She displays normal reflexes. No cranial nerve deficit. She exhibits normal muscle tone. Coordination normal.  Skin: Skin is warm. No rash noted. She is not diaphoretic. No erythema. No pallor.  Psychiatric: She has a normal mood and affect. Her behavior is normal. Judgment and thought content normal.  Vitals reviewed.         Assessment & Plan:  Other polyneuropathy - Plan: CBC with Differential/Platelet, COMPLETE METABOLIC PANEL WITH GFR, TSH, Vitamin B12  Age-related osteoporosis without current pathological fracture  Benign essential HTN  Pure hypercholesterolemia  Routine general medical examination at a health care facility  Left ear impacted cerumen I will schedule the patient for Prolia.  I informed the patient's daughter of this to help her manage it.  Recommended calcium 1200 mg a day and vitamin D.  Blood pressure is elevated but the patient has whitecoat syndrome.  They will check her blood pressure at home and notify us of the values.  Recommended the shingles vaccine.  Recommended against mammogram, colonoscopy, or Pap smear.  Check CBC, CMP, TSH, and vitamin B12 due to peripheral neuropathy.

## 2017-06-04 LAB — COMPLETE METABOLIC PANEL WITH GFR
AG RATIO: 1.3 (calc) (ref 1.0–2.5)
ALT: 13 U/L (ref 6–29)
AST: 21 U/L (ref 10–35)
Albumin: 3.9 g/dL (ref 3.6–5.1)
Alkaline phosphatase (APISO): 59 U/L (ref 33–130)
BUN: 15 mg/dL (ref 7–25)
CALCIUM: 9.8 mg/dL (ref 8.6–10.4)
CO2: 28 mmol/L (ref 20–32)
Chloride: 104 mmol/L (ref 98–110)
Creat: 0.82 mg/dL (ref 0.60–0.88)
GFR, EST AFRICAN AMERICAN: 73 mL/min/{1.73_m2} (ref >=60)
GFR, EST NON AFRICAN AMERICAN: 63 mL/min/{1.73_m2} (ref >=60)
GLOBULIN: 3.1 g/dL (ref 1.9–3.7)
Glucose, Bld: 83 mg/dL (ref 65–99)
POTASSIUM: 4.2 mmol/L (ref 3.5–5.3)
Sodium: 141 mmol/L (ref 135–146)
Total Bilirubin: 0.4 mg/dL (ref 0.2–1.2)
Total Protein: 7 g/dL (ref 6.1–8.1)

## 2017-06-04 LAB — CBC WITH DIFFERENTIAL/PLATELET
BASOS ABS: 42 {cells}/uL (ref 0–200)
Basophils Relative: 0.8 %
EOS PCT: 0.6 %
Eosinophils Absolute: 32 cells/uL (ref 15–500)
HEMATOCRIT: 37.1 % (ref 35.0–45.0)
HEMOGLOBIN: 12.5 g/dL (ref 11.7–15.5)
LYMPHS ABS: 1357 {cells}/uL (ref 850–3900)
MCH: 30.7 pg (ref 27.0–33.0)
MCHC: 33.7 g/dL (ref 32.0–36.0)
MCV: 91.2 fL (ref 80.0–100.0)
MPV: 11.2 fL (ref 7.5–12.5)
Monocytes Relative: 9.9 %
NEUTROS ABS: 3344 {cells}/uL (ref 1500–7800)
Neutrophils Relative %: 63.1 %
Platelets: 244 10*3/uL (ref 140–400)
RBC: 4.07 10*6/uL (ref 3.80–5.10)
RDW: 12.8 % (ref 11.0–15.0)
Total Lymphocyte: 25.6 %
WBC mixed population: 525 cells/uL (ref 200–950)
WBC: 5.3 10*3/uL (ref 3.8–10.8)

## 2017-06-04 LAB — TSH: TSH: 0.97 mIU/L (ref 0.40–4.50)

## 2017-06-04 LAB — VITAMIN B12: VITAMIN B 12: 546 pg/mL (ref 200–1100)

## 2017-06-06 ENCOUNTER — Encounter: Payer: Self-pay | Admitting: Family Medicine

## 2017-06-16 ENCOUNTER — Ambulatory Visit: Payer: Medicare Other

## 2017-08-10 ENCOUNTER — Other Ambulatory Visit: Payer: Self-pay | Admitting: *Deleted

## 2017-08-10 MED ORDER — LOSARTAN POTASSIUM 100 MG PO TABS: 100.0000 mg | ORAL_TABLET | Freq: Every day | ORAL | 3 refills | Status: DC

## 2017-11-28 ENCOUNTER — Other Ambulatory Visit: Payer: Self-pay | Admitting: Family Medicine

## 2017-12-02 ENCOUNTER — Emergency Department (HOSPITAL_COMMUNITY): Payer: Medicare Other

## 2017-12-02 ENCOUNTER — Ambulatory Visit: Payer: Medicare Other | Admitting: Family Medicine

## 2017-12-02 ENCOUNTER — Other Ambulatory Visit: Payer: Self-pay

## 2017-12-02 ENCOUNTER — Encounter (HOSPITAL_COMMUNITY): Payer: Self-pay | Admitting: Family Medicine

## 2017-12-02 ENCOUNTER — Emergency Department (HOSPITAL_COMMUNITY)
Admission: EM | Admit: 2017-12-02 | Discharge: 2017-12-02 | Discharge status: Against Medical Advice | Payer: Medicare Other | Attending: Emergency Medicine | Admitting: Emergency Medicine

## 2017-12-02 ENCOUNTER — Telehealth: Payer: Self-pay | Admitting: *Deleted

## 2017-12-02 ENCOUNTER — Encounter (HOSPITAL_COMMUNITY): Payer: Self-pay | Admitting: Emergency Medicine

## 2017-12-02 DIAGNOSIS — M79662 Pain in left lower leg: Secondary | ICD-10-CM

## 2017-12-02 DIAGNOSIS — Z5321 Procedure and treatment not carried out due to patient leaving prior to being seen by health care provider: Secondary | ICD-10-CM | POA: Diagnosis not present

## 2017-12-02 DIAGNOSIS — S79912A Unspecified injury of left hip, initial encounter: Secondary | ICD-10-CM | POA: Diagnosis not present

## 2017-12-02 DIAGNOSIS — M79661 Pain in right lower leg: Secondary | ICD-10-CM

## 2017-12-02 DIAGNOSIS — M79604 Pain in right leg: Secondary | ICD-10-CM | POA: Diagnosis not present

## 2017-12-02 DIAGNOSIS — M79605 Pain in left leg: Secondary | ICD-10-CM | POA: Insufficient documentation

## 2017-12-02 DIAGNOSIS — S79911A Unspecified injury of right hip, initial encounter: Secondary | ICD-10-CM | POA: Diagnosis not present

## 2017-12-02 NOTE — ED Notes (Signed)
Patient's daughter states that they can wait no longer; promised to follow up in the a.m., with either the ER or Urgent Care. Advised family to return if patient becomes altered, projective vomiting, etc. (in case of head injury). Patient is currently stable, NAD and alert and oriented.

## 2017-12-02 NOTE — ED Provider Notes (Signed)
Patient placed in Quick Look pathway, seen and evaluated   Chief Complaint: pain in legs with walking  HPI: Patient presents with complaint of bilateral leg pain that occurs only when she walks.  She states that she had a fall 1 week ago on steps.  She did not initially have pain in her legs or difficulty walking, however over the past 5 or so days, symptoms have worsened.  She is having difficulty walking now.  When patient is seated or lying, she does not have any pain at all.  No neck pain or back pain.  She describes feeling generally weak in her legs.   ROS:  Positive ROS: (+) Leg pain  negative ROS: (-) Back pain  Physical Exam:   Gen: No distress  Neuro: Awake and Alert  Skin: Warm    Focused Exam: Abd soft, NT, no rebound or guarding; Ext 2+ pedal pulses bilaterally, no edema, no focal tenderness over the back or the upper legs.  BP (!) 174/84 (BP Location: Left Arm)   Pulse 84   Temp 98.7 F (37.1 C) (Oral)   Resp 16   SpO2 97%   Plan: Patient with generalized pain in her legs with activity.  Do not suspect claudication.  She does not have any focal tenderness.  Given age and history of fall, will image back and hips.  Initiation of care has begun. The patient has been counseled on the process, plan, and necessity for staying for the completion/evaluation, and the remainder of the medical screening examination    [place] Cater, Hershal Coria 12/02/17 1517    Mesner, Corene Cornea, MD 12/08/17 [id number]

## 2017-12-02 NOTE — Telephone Encounter (Signed)
Immediately after Margreta Journey talked to pt's daughter, we went outside into the parking lot to quickly assess, since the daughter reported she had arrived her for her appointment, however pt could not be found in our clinic parking lot of grounds to be assessed.    Will f/up to see that she arrived in ED for eval - which is what daughter was advised to do.

## 2017-12-02 NOTE — Telephone Encounter (Signed)
Call placed to patient daughter Shirley Bates to inquire about upcoming appointment. Patient scheduled to see PA for fall with increased difficulty walking.   Per patient daughter, patient fell outside and landed on L side causing abrasions to L arm, discolorations to L side of ABD and knot to forehead. Unable to verbalize exactly how she fell. States that patient was able to get back up with some help from the neighbor and make it back inside.   Reports that patient did state she was sore, but was able to move near baseline. States that over the next few days, she was noted to have been more stiff and moving slower. EMS was called at one point due to increased confusion and inability to get out of chair. EMS reportedly stated that no leg length discrepancy noted, and no S/Sx of break noted. Patient was able to ambulate for EMS. Patient did not go to hospital.   States that on 12/01/2017 and 12/02/2017 patient has been noted to be more lethargic and slightly confused, though she still is answering most questions appropriately. States that patient is unable to ambulate more than a few steps at a time.   Discussed with provider who recommended ER eval. Trenton Gammon that patient should go to ER for imaging of head, spine and pelvis. Patient daughter Shirley Bates verbalized understanding and agrees with plan.

## 2017-12-02 NOTE — ED Triage Notes (Signed)
Patient to ED c/o persistent leg pain following mechanical fall last Friday - reported to be taking the trash can up the exterior front stairs and being pulled down by it. Patient was unable to get up on her own - assisted back into home by neighbor. Patient denies LOC. She states she fell on her L side - bruising to L wrist and L leg and abrasion to L elbow and nose. Patient ambulates with cane and able to take steps on own - but reports pain in both legs, from upper thighs down to the bottoms of her feet.

## 2017-12-02 NOTE — Telephone Encounter (Signed)
Agree 

## 2017-12-03 ENCOUNTER — Emergency Department (HOSPITAL_COMMUNITY)
Admission: EM | Admit: 2017-12-03 | Discharge: 2017-12-03 | Disposition: A | Discharge status: Home / Self Care | Payer: Medicare Other | Attending: Emergency Medicine | Admitting: Emergency Medicine

## 2017-12-03 ENCOUNTER — Other Ambulatory Visit: Payer: Self-pay

## 2017-12-03 ENCOUNTER — Encounter (HOSPITAL_COMMUNITY): Payer: Self-pay | Admitting: *Deleted

## 2017-12-03 DIAGNOSIS — Y9389 Activity, other specified: Secondary | ICD-10-CM | POA: Insufficient documentation

## 2017-12-03 DIAGNOSIS — W109XXA Fall (on) (from) unspecified stairs and steps, initial encounter: Secondary | ICD-10-CM | POA: Diagnosis not present

## 2017-12-03 DIAGNOSIS — W19XXXA Unspecified fall, initial encounter: Secondary | ICD-10-CM

## 2017-12-03 DIAGNOSIS — Z7982 Long term (current) use of aspirin: Secondary | ICD-10-CM | POA: Diagnosis not present

## 2017-12-03 DIAGNOSIS — I10 Essential (primary) hypertension: Secondary | ICD-10-CM | POA: Insufficient documentation

## 2017-12-03 DIAGNOSIS — Z79899 Other long term (current) drug therapy: Secondary | ICD-10-CM | POA: Insufficient documentation

## 2017-12-03 DIAGNOSIS — Y929 Unspecified place or not applicable: Secondary | ICD-10-CM | POA: Diagnosis not present

## 2017-12-03 DIAGNOSIS — M79604 Pain in right leg: Secondary | ICD-10-CM

## 2017-12-03 DIAGNOSIS — Y999 Unspecified external cause status: Secondary | ICD-10-CM | POA: Diagnosis not present

## 2017-12-03 DIAGNOSIS — Z85828 Personal history of other malignant neoplasm of skin: Secondary | ICD-10-CM | POA: Insufficient documentation

## 2017-12-03 DIAGNOSIS — F0390 Unspecified dementia without behavioral disturbance: Secondary | ICD-10-CM | POA: Diagnosis not present

## 2017-12-03 DIAGNOSIS — M79605 Pain in left leg: Secondary | ICD-10-CM | POA: Diagnosis not present

## 2017-12-03 NOTE — ED Triage Notes (Signed)
Pt in after a fall, seen for same yesterday and had xrays but left due to wait, states she fell down her steps last week, in due to continued pain with ambulation that is getting worse

## 2017-12-03 NOTE — ED Notes (Signed)
Pt ambulatory with assistive device, walker, with no assistance from this NT. Pt tolerated well.

## 2017-12-03 NOTE — ED Notes (Signed)
Patient given discharge instructions and verbalized understanding.  Patient stable to discharge at this time.  Patient is alert and oriented to baseline.  No distressed noted at this time.  All belongings taken with the patient at discharge.   

## 2017-12-03 NOTE — ED Provider Notes (Signed)
[facility]GENC[facility] DEPARTMENT Provider Note   CSN: [id number] Arrival date & time: 12/03/17  5009     History   Chief Complaint Chief Complaint  Patient presents with  . Fall    HPI Shirley Bates is a 84 y.o. female who presents the emergency department with complaint of pain with ambulation.  Is a level 5 caveat due to dementia.  History is gathered from the patient and her daughter who is at bedside.  The history is also made difficult by the patient's dementia and inability to characterize or specify her pain issues.  According to the patient's daughter she had what appears to be a mechanical fall 1 week ago.  She was carrying a trash can down the stairs and slipped.  She was unable to tell me what she hit when she fell except she knows she had her left elbow because of some scratches.  She was helped back into the house and able to ambulate with assistance by a passerby.  Her daughter states that over the past week she has had increasing decline and complains of pain when trying to lift her legs with ambulation.  She is unable to specify where the pain is located.  She seems to have no pain at rest.  She uses a cane for ambulation and has been able to ambulate but has been doing a lot of complaining.  She does not appear to have any change in her mental status and has her baseline dementia which includes difficulty giving timelines.  She has not had any fever, difficulty urinating or   Defecating according to the daughter. The patient waited in the ER waiting room yesterday but left due to long wait.  She had a screening bilateral hip and pelvic x-ray and lumbar x-ray which showed no acute abnormalities.  HPI  Past Medical History:  Diagnosis Date  . Basal cell carcinoma of leg    left 2014  . Hyperlipidemia   . Hypertension     Patient Active Problem List   Diagnosis Date Noted  . Osteoporosis 05/28/2015  . Left ear impacted cerumen 12/12/2012  . OA  (osteoarthritis) of neck 12/12/2012  . Hyperlipidemia   . Hypertension     History reviewed. No pertinent surgical history.   OB History   None      Home Medications    Prior to Admission medications   Medication Sig Start Date End Date Taking? Authorizing Provider  aspirin 81 MG tablet Take 81 mg by mouth daily.   Yes [provider]  Calcium Carb-Cholecalciferol (CALCIUM 600 + D PO) Take 1 tablet by mouth 2 (two) times daily.   Yes [provider]  losartan (COZAAR) 100 MG tablet Take 1 tablet (100 mg total) by mouth daily. 08/10/17  Yes Susy Frizzle, MD  multivitamin-iron-minerals-folic acid (CENTRUM) chewable tablet Chew 1 tablet by mouth daily.   Yes [provider]  naphazoline-pheniramine (NAPHCON-A) 0.025-0.3 % ophthalmic solution Place 1 drop into both eyes daily.   Yes [provider]  naproxen sodium (ALEVE) 220 MG tablet Take 220 mg by mouth daily as needed (for pain).   Yes [provider]  Omega-3 Fatty Acids (FISH OIL) 1200 MG CAPS Take 1,200 mg by mouth every morning.    Yes [provider]  simvastatin (ZOCOR) 20 MG tablet TAKE 1 TABLET BY MOUTH AT BEDTIME Patient taking differently: Take 20 mg by mouth at bedtime.  11/28/17  Yes Susy Frizzle,  MD    Family History Family History  Problem Relation Age of Onset  . Heart disease Father   . Heart disease Sister   . Heart disease Brother   . Hypertension Brother   . Breast cancer Neg Hx     Social History Social History   Tobacco Use  . Smoking status: Never Smoker  . Smokeless tobacco: Never Used  Substance Use Topics  . Alcohol use: No  . Drug use: No     Allergies   Penicillins   Review of Systems Review of Systems  Unable to review systems secondary to dementia Physical Exam Updated Vital Signs BP (!) 148/66   Pulse 76   SpO2 100%   Physical Exam  Constitutional: She appears well-developed and well-nourished. No distress.    HENT:  Head: Normocephalic and atraumatic.  Eyes: Conjunctivae are normal. No scleral icterus.  Neck: Normal range of motion.  Cardiovascular: Normal rate, regular rhythm and normal heart sounds. Exam reveals no gallop and no friction rub.  No murmur heard. Pulmonary/Chest: Effort normal and breath sounds normal. No respiratory distress.  Abdominal: Soft. Bowel sounds are normal. She exhibits no distension and no mass. There is no tenderness. There is no guarding.  Musculoskeletal:  Full range of motion without pain of the ankles, knees, hips, wrists, elbows and shoulders bilaterally.  She has no midline spinal tenderness or cervical fine tenderness.  She is full range of motion of her neck.  She has no pain with palpation of the joints, no deformities, no bruising, no swelling, no tenderness with palpation of the head bones.  She is able to ambulate with her cane and assistance.  Neurological: She is alert.  Skin: Skin is warm and dry. She is not diaphoretic.  Old bruising to the left wrist and left medial thigh  Psychiatric: Her behavior is normal.  Nursing note and vitals reviewed.    ED Treatments / Results  Labs (all labs ordered are listed, but only abnormal results are displayed) Labs Reviewed - No data to display  EKG None  Radiology Dg Lumbar Spine Complete  Result Date: 12/02/2017 CLINICAL DATA:  Persistent leg pain after falling 1 week ago. EXAM: LUMBAR SPINE - COMPLETE 4+ VIEW COMPARISON:  None. FINDINGS: Thoracolumbar curvature convex to the left with the apex at L1-2. Rightward translation of T12 relative to L1 of 1 cm. Leftward translation of L2 relative to L3 of 1 cm. Leftward translation of L3 relative to L4 of 1.5 cm. Degenerative disc disease more pronounced on the right in the thoracolumbar region and on the left in the lower lumbar region. No evidence of regional fracture. IMPRESSION: No acute or traumatic finding by radiography. Thoracolumbar curvature and  extensive chronic degenerative changes as above. Electronically Signed   By: Nelson Chimes M.D.   On: 12/02/2017 16:30   Dg Hips Bilat W Or Wo Pelvis 3-4 Views  Result Date: 12/02/2017 CLINICAL DATA:  Persistent leg pain after falling last week. EXAM: DG HIP (WITH OR WITHOUT PELVIS) 3-4V BILAT COMPARISON:  None. FINDINGS: No evidence of pelvic or hip fracture. IMPRESSION: Negative. Electronically Signed   By: Nelson Chimes M.D.   On: 12/02/2017 16:32    Procedures Procedures (including critical care time)  Medications Ordered in ED Medications - No data to display   Initial Impression / Assessment and Plan / ED Course  I have reviewed the triage vital signs and the nursing notes.  Pertinent labs & imaging results that were available during  my care of the patient were reviewed by me and considered in my medical decision making (see chart for details).     Patient with pain after fall.  No evidence of fracture on her imaging done yesterday.  I personally reviewed the bilateral hip and pelvis and lumbar x-rays and see no evidence of fracture.  She is also nontender to the bony structures.  She is ambulatory with a cane and I watch the patient walk up and down the hall with a walker for times.  Her's daughter states that this is the most walking she has been able to do.  She does not appear to need admission at this time and I have asked our case manager to have her set up with physical therapy and occupational therapy at her home.  She and her daughter are happy with this plan.  She appears appropriate for discharge at this time.  Final Clinical Impressions(s) / ED Diagnoses   Final diagnoses:  None    ED Discharge Orders    None       Margarita Mail, PA-C 12/03/17 1603    Malvin Johns, MD 12/03/17 [phone]

## 2017-12-03 NOTE — Discharge Instructions (Addendum)
Contact a health care provider if: Your pain is getting worse. Your pain is not relieved with medicines. You lose function in the area of the pain if the pain is in your arms, legs, or neck.

## 2017-12-05 DIAGNOSIS — F0390 Unspecified dementia without behavioral disturbance: Secondary | ICD-10-CM | POA: Diagnosis not present

## 2017-12-05 DIAGNOSIS — E785 Hyperlipidemia, unspecified: Secondary | ICD-10-CM | POA: Diagnosis not present

## 2017-12-05 DIAGNOSIS — I10 Essential (primary) hypertension: Secondary | ICD-10-CM | POA: Diagnosis not present

## 2017-12-05 DIAGNOSIS — R2689 Other abnormalities of gait and mobility: Secondary | ICD-10-CM | POA: Diagnosis not present

## 2017-12-05 DIAGNOSIS — M5136 Other intervertebral disc degeneration, lumbar region: Secondary | ICD-10-CM | POA: Diagnosis not present

## 2017-12-05 DIAGNOSIS — Z7982 Long term (current) use of aspirin: Secondary | ICD-10-CM | POA: Diagnosis not present

## 2017-12-05 DIAGNOSIS — W109XXD Fall (on) (from) unspecified stairs and steps, subsequent encounter: Secondary | ICD-10-CM | POA: Diagnosis not present

## 2017-12-05 DIAGNOSIS — H9190 Unspecified hearing loss, unspecified ear: Secondary | ICD-10-CM | POA: Diagnosis not present

## 2017-12-05 DIAGNOSIS — Z9181 History of falling: Secondary | ICD-10-CM | POA: Diagnosis not present

## 2017-12-05 DIAGNOSIS — M47812 Spondylosis without myelopathy or radiculopathy, cervical region: Secondary | ICD-10-CM | POA: Diagnosis not present

## 2017-12-05 DIAGNOSIS — M810 Age-related osteoporosis without current pathological fracture: Secondary | ICD-10-CM | POA: Diagnosis not present

## 2017-12-05 DIAGNOSIS — Z85828 Personal history of other malignant neoplasm of skin: Secondary | ICD-10-CM | POA: Diagnosis not present

## 2017-12-16 ENCOUNTER — Ambulatory Visit (INDEPENDENT_AMBULATORY_CARE_PROVIDER_SITE_OTHER): Payer: Medicare Other | Admitting: Family Medicine

## 2017-12-16 ENCOUNTER — Encounter: Payer: Self-pay | Admitting: Family Medicine

## 2017-12-16 VITALS — BP 176/92 | HR 78 | Temp 97.6°F | Resp 12 | Ht 61.25 in | Wt 108.0 lb

## 2017-12-16 DIAGNOSIS — M810 Age-related osteoporosis without current pathological fracture: Secondary | ICD-10-CM | POA: Diagnosis not present

## 2017-12-16 DIAGNOSIS — W19XXXD Unspecified fall, subsequent encounter: Secondary | ICD-10-CM

## 2017-12-16 DIAGNOSIS — Z23 Encounter for immunization: Secondary | ICD-10-CM | POA: Diagnosis not present

## 2017-12-16 MED ORDER — ALENDRONATE SODIUM 70 MG PO TABS: 70.0000 mg | ORAL_TABLET | ORAL | 11 refills | Status: DC

## 2017-12-16 NOTE — Progress Notes (Signed)
Subjective:    Patient ID: Shirley Bates, female    DOB: [birth year], 84 y.o.   MRN: [id number]  HPI Patient is a very pleasant 84 year old Caucasian female who recently fell down steps at home.  Due to hip pain she went to the emergency room.  X-rays of the hips were negative for any fracture.  I reviewed the x-rays obtained in the emergency room.  The ER physician recommended physical therapy and occupational therapy at home.  Physical therapy is currently working with the patient.  They have declined occupational therapy due to how well the patient is doing at home and not physically requiring it.  He is currently walking around the home using a walker.  She has a fall pendant.  Daughter is checking on her multiple times throughout the day.  She has a history of osteoporosis and previously did not tolerate Fosamax due to stomach discomfort.  She is unable to afford Prolia.  She is taking calcium and vitamin D.  She denies any chest pain shortness of breath dyspnea on exertion.  She denies any dysuria urgency or frequency. Past Medical History:  Diagnosis Date  . Basal cell carcinoma of leg    left 2014  . Hyperlipidemia   . Hypertension    No past surgical history on file. Current Outpatient Medications on File Prior to Visit  Medication Sig Dispense Refill  . aspirin 81 MG tablet Take 81 mg by mouth daily.    . Calcium Carb-Cholecalciferol (CALCIUM 600 + D PO) Take 1 tablet by mouth 2 (two) times daily.    Marland Kitchen losartan (COZAAR) 100 MG tablet Take 1 tablet (100 mg total) by mouth daily. 90 tablet 3  . multivitamin-iron-minerals-folic acid (CENTRUM) chewable tablet Chew 1 tablet by mouth daily.    . naproxen sodium (ALEVE) 220 MG tablet Take 220 mg by mouth daily as needed (for pain).    . Omega-3 Fatty Acids (FISH OIL) 1200 MG CAPS Take 1,200 mg by mouth every morning.     . simvastatin (ZOCOR) 20 MG tablet TAKE 1 TABLET BY MOUTH AT BEDTIME (Patient taking differently: Take 20 mg by mouth at  bedtime. ) 90 tablet 3   No current facility-administered medications on file prior to visit.    Allergies  Allergen Reactions  . Penicillins    Social History   Socioeconomic History  . Marital status: Divorced    Spouse name: Not on file  . Number of children: Not on file  . Years of education: Not on file  . Highest education level: Not on file  Occupational History  . Not on file  Social Needs  . Financial resource strain: Not on file  . Food insecurity:    Worry: Not on file    Inability: Not on file  . Transportation needs:    Medical: Not on file    Non-medical: Not on file  Tobacco Use  . Smoking status: Never Smoker  . Smokeless tobacco: Never Used  Substance and Sexual Activity  . Alcohol use: No  . Drug use: No  . Sexual activity: Never  Lifestyle  . Physical activity:    Days per week: Not on file    Minutes per session: Not on file  . Stress: Not on file  Relationships  . Social connections:    Talks on phone: Not on file    Gets together: Not on file    Attends religious service: Not on file    Active member  of club or organization: Not on file    Attends meetings of clubs or organizations: Not on file    Relationship status: Not on file  . Intimate partner violence:    Fear of current or ex partner: Not on file    Emotionally abused: Not on file    Physically abused: Not on file    Forced sexual activity: Not on file  Other Topics Concern  . Not on file  Social History Narrative  . Not on file       Review of Systems  All other systems reviewed and are negative.      Objective:   Physical Exam  Constitutional: She appears well-developed and well-nourished.  Cardiovascular: Normal rate, regular rhythm, normal heart sounds and intact distal pulses. Exam reveals no gallop and no friction rub.  No murmur heard. Pulmonary/Chest: Effort normal and breath sounds normal. No stridor. No respiratory distress. She has no wheezes. She has no  rales. She exhibits no tenderness.  Abdominal: Soft. Bowel sounds are normal. She exhibits no distension and no mass. There is no tenderness. There is no rebound and no guarding. No hernia.  Musculoskeletal:       Right hip: She exhibits normal range of motion, normal strength, no tenderness, no bony tenderness, no crepitus and no deformity.       Left hip: She exhibits normal range of motion, normal strength, no tenderness, no bony tenderness, no crepitus and no deformity.          Assessment & Plan:  Fall Osteoporosis Clinically the patient is very lucky.  She avoided any significant injury this time.  I have recommended that she use a walker whenever she ambulates.  Continue calcium 1200 mg a day and vitamin D 1000 units a day.  I strongly recommended that she try Fosamax again 70 mg weekly due to her osteoporosis.  She already has physical therapy working with her and she is doing extremely well.  On timed get up and go test, the patient is able to stand and walk to the exam table and less than 2 seconds.  I was very impressed by that.  Her blood pressure is elevated today however she has whitecoat syndrome.  Her blood pressures at home are excellent.  Patient received her flu shot today

## 2017-12-16 NOTE — Addendum Note (Signed)
Addended by: Shary Decamp B on: 12/16/2017 03:41 PM   Modules accepted: Orders

## 2018-03-23 ENCOUNTER — Other Ambulatory Visit: Payer: Self-pay | Admitting: Family Medicine

## 2018-03-23 MED ORDER — ALENDRONATE SODIUM 70 MG PO TABS: 70.0000 mg | ORAL_TABLET | ORAL | 3 refills | Status: DC

## 2018-06-02 ENCOUNTER — Other Ambulatory Visit: Payer: Self-pay | Admitting: *Deleted

## 2018-06-02 MED ORDER — SIMVASTATIN 20 MG PO TABS: 20.0000 mg | ORAL_TABLET | Freq: Every day | ORAL | 3 refills | Status: DC

## 2018-06-12 ENCOUNTER — Other Ambulatory Visit: Payer: Self-pay | Admitting: Family Medicine

## 2018-09-28 ENCOUNTER — Encounter: Payer: Medicare Other | Admitting: Family Medicine

## 2018-10-13 ENCOUNTER — Other Ambulatory Visit: Payer: Self-pay

## 2018-10-16 ENCOUNTER — Ambulatory Visit (INDEPENDENT_AMBULATORY_CARE_PROVIDER_SITE_OTHER): Payer: Medicare Other | Admitting: Family Medicine

## 2018-10-16 ENCOUNTER — Other Ambulatory Visit: Payer: Self-pay

## 2018-10-16 ENCOUNTER — Encounter: Payer: Self-pay | Admitting: Family Medicine

## 2018-10-16 VITALS — BP 160/90 | HR 82 | Temp 98.4°F | Resp 16 | Ht 61.25 in | Wt 112.0 lb

## 2018-10-16 DIAGNOSIS — M810 Age-related osteoporosis without current pathological fracture: Secondary | ICD-10-CM | POA: Diagnosis not present

## 2018-10-16 DIAGNOSIS — H6122 Impacted cerumen, left ear: Secondary | ICD-10-CM

## 2018-10-16 DIAGNOSIS — I10 Essential (primary) hypertension: Secondary | ICD-10-CM

## 2018-10-16 DIAGNOSIS — E7800 Pure hypercholesterolemia, unspecified: Secondary | ICD-10-CM

## 2018-10-16 DIAGNOSIS — Z0001 Encounter for general adult medical examination with abnormal findings: Secondary | ICD-10-CM | POA: Diagnosis not present

## 2018-10-16 DIAGNOSIS — Z0000 Encounter for general adult medical examination without abnormal findings: Secondary | ICD-10-CM

## 2018-10-16 MED ORDER — SOLIFENACIN SUCCINATE 10 MG PO TABS: 10.0000 mg | ORAL_TABLET | Freq: Every day | ORAL | 3 refills | Status: DC

## 2018-10-16 NOTE — Progress Notes (Signed)
Subjective:    Patient ID: Shirley Bates, female    DOB: [birth year], 85 y.o.   MRN: [id number]  HPI Patient is here today for complete physical exam. . Because of her age, she does not require a Pap smear, mammogram, or colonoscopy because of her age. She has a history of severe osteoporosis on bone density from 2015.  She resume Fosamax last year after her physical exam and has been doing well on it ever since.  She denies any stomach upset.  This was the reason she discontinued the medication in the past.  Her daughter is supervising her medication use.  Her blood pressure today is elevated.  She is not checking her blood pressure at home but she does have a history of whitecoat syndrome.  I encouraged the patient to start checking her blood pressure at home.  I like to keep her systolic blood pressure in the 140s.  She is walking every day using a cane and a walker.  Sometimes she likes to walk on dirt roads for exercise.  I have recommended against this due to the increased risk of falls walking on uneven surfaces.  Patient is wearing a fall alert pendant.  She denies any injuries or recent falls.  She does have some age-related memory loss but otherwise is doing relatively well.  She lives independently.  Her daughter checks on her at least every day.  They also have cameras in her home where they can check on her to see if everything is okay.  Her only complaint is overactive bladder.  She states that she gets frequent sudden urges to urinate.  Occasionally she cannot make it to the bathroom in time.  It awakens her from sleep.  She denies any dysuria or hematuria.  She denies any urinary retention Immunization History  Administered Date(s) Administered  . Influenza, High Dose Seasonal PF 12/16/2017  . Influenza,inj,Quad PF,6+ Mos 12/12/2012, 01/23/2014, 01/21/2015, 02/02/2016  . Influenza-Unspecified 01/07/2017  . Pneumococcal Conjugate-13 04/27/2013  . Pneumococcal Polysaccharide-23 03/13/2003   . Td 12/31/2008   DEXA 2015-osteoporosis  Past Medical History:  Diagnosis Date  . Basal cell carcinoma of leg    left 2014  . Hyperlipidemia   . Hypertension    No past surgical history on file. Current Outpatient Medications on File Prior to Visit  Medication Sig Dispense Refill  . alendronate (FOSAMAX) 70 MG tablet Take 1 tablet (70 mg total) by mouth every 7 (seven) days. Take with a full glass of water on an empty stomach. 12 tablet 3  . aspirin 81 MG tablet Take 81 mg by mouth daily.    Marland Kitchen losartan (COZAAR) 100 MG tablet TAKE 1 TABLET BY MOUTH DAILY. GENERIC EQUIVALENT FOR COZAAR 90 tablet 3  . multivitamin-iron-minerals-folic acid (CENTRUM) chewable tablet Chew 1 tablet by mouth daily.    . Omega-3 Fatty Acids (FISH OIL) 1200 MG CAPS Take 1,200 mg by mouth every morning.     . simvastatin (ZOCOR) 20 MG tablet Take 1 tablet (20 mg total) by mouth at bedtime. 90 tablet 3   No current facility-administered medications on file prior to visit.    Allergies  Allergen Reactions  . Penicillins    Social History   Socioeconomic History  . Marital status: Divorced    Spouse name: Not on file  . Number of children: Not on file  . Years of education: Not on file  . Highest education level: Not on file  Occupational History  . Not  on file  Social Needs  . Financial resource strain: Not on file  . Food insecurity    Worry: Not on file    Inability: Not on file  . Transportation needs    Medical: Not on file    Non-medical: Not on file  Tobacco Use  . Smoking status: Never Smoker  . Smokeless tobacco: Never Used  Substance and Sexual Activity  . Alcohol use: No  . Drug use: No  . Sexual activity: Never  Lifestyle  . Physical activity    Days per week: Not on file    Minutes per session: Not on file  . Stress: Not on file  Relationships  . Social Herbalist on phone: Not on file    Gets together: Not on file    Attends religious service: Not on file     Active member of club or organization: Not on file    Attends meetings of clubs or organizations: Not on file    Relationship status: Not on file  . Intimate partner violence    Fear of current or ex partner: Not on file    Emotionally abused: Not on file    Physically abused: Not on file    Forced sexual activity: Not on file  Other Topics Concern  . Not on file  Social History Narrative  . Not on file   Family History  Problem Relation Age of Onset  . Heart disease Father   . Heart disease Sister   . Heart disease Brother   . Hypertension Brother   . Breast cancer Neg Hx       Review of Systems  All other systems reviewed and are negative.      Objective:   Physical Exam  Constitutional: She is oriented to person, place, and time. She appears well-developed and well-nourished. No distress.  HENT:  Head: Normocephalic and atraumatic.  Right Ear: External ear normal.  Left Ear: External ear normal.  Nose: Nose normal.  Mouth/Throat: Oropharynx is clear and moist. No oropharyngeal exudate.  Eyes: Pupils are equal, round, and reactive to light. Conjunctivae and EOM are normal. Right eye exhibits no discharge. Left eye exhibits no discharge. No scleral icterus.  Neck: Normal range of motion. Neck supple. No thyromegaly present.  Cardiovascular: Normal rate, regular rhythm and normal heart sounds.  Pulmonary/Chest: Effort normal and breath sounds normal.  Abdominal: Soft. Bowel sounds are normal. She exhibits no distension. There is no abdominal tenderness. There is no rebound and no guarding.  Musculoskeletal:        General: No edema.  Lymphadenopathy:    She has no cervical adenopathy.  Neurological: She is alert and oriented to person, place, and time. She displays normal reflexes. No cranial nerve deficit. She exhibits normal muscle tone. Coordination normal.  Skin: Skin is warm. No rash noted. She is not diaphoretic. No erythema. No pallor.  Psychiatric: She has a  normal mood and affect. Her behavior is normal. Judgment and thought content normal.  Vitals reviewed.         Assessment & Plan:  1. Benign essential HTN Blood pressure today is elevated.  I have encouraged the patient to check her blood pressure at home and notify me of the values.  If consistently greater than 735 systolic I would increase her medication. - CBC with Differential/Platelet - COMPLETE METABOLIC PANEL WITH GFR - Lipid panel  2. Age-related osteoporosis without current pathological fracture Patient is taking calcium vitamin  D and Fosamax.  No changes at the present time.  She has been on the medication now for only 1 year  3. Pure hypercholesterolemia Check fasting lipid panel.  Goal LDL cholesterol is under 100  4. Routine general medical examination at a health care facility Patient has mild age-related memory loss.  She denies any depression.  She does need assistance with IADLs which her daughter provides.  She is a high fall risk and is using a cane and walker.  I recommended against walking on uneven terrain.  Patient is wearing a follow-up in and her daughter checks on her daily and has cameras located around her home where she can check in anytime  5. Left ear impacted cerumen Impacted cerumen was removed easily with irrigation

## 2018-10-30 DIAGNOSIS — Z85828 Personal history of other malignant neoplasm of skin: Secondary | ICD-10-CM | POA: Diagnosis not present

## 2018-10-30 DIAGNOSIS — L570 Actinic keratosis: Secondary | ICD-10-CM | POA: Diagnosis not present

## 2018-10-30 DIAGNOSIS — C44311 Basal cell carcinoma of skin of nose: Secondary | ICD-10-CM | POA: Diagnosis not present

## 2018-11-02 ENCOUNTER — Telehealth: Payer: Self-pay | Admitting: *Deleted

## 2018-11-02 NOTE — Telephone Encounter (Signed)
Received request from pharmacy for PA on Vesicare  PA submitted.   Dx: T55.73- OAB

## 2018-11-02 NOTE — Telephone Encounter (Signed)
Your information has been submitted to [employer] [place]. [employer] [place] will review the request and notify you of the determination decision directly, typically within 3 business days of your submission and once all necessary information is received.  You will also receive your request decision electronically. To check for an update later, open the request again from your dashboard.  If [employer] [place] has not responded within the specified timeframe or if you have any questions about your PA submission, contact [employer]  directly at (MAPD) [phone] or (PDP) [phone]. 

## 2018-11-16 NOTE — Telephone Encounter (Signed)
Received call from patient.   Medication has been denied.

## 2018-11-20 DIAGNOSIS — Z85828 Personal history of other malignant neoplasm of skin: Secondary | ICD-10-CM | POA: Diagnosis not present

## 2018-11-20 DIAGNOSIS — C44321 Squamous cell carcinoma of skin of nose: Secondary | ICD-10-CM | POA: Diagnosis not present

## 2018-11-30 NOTE — Telephone Encounter (Signed)
Appeal faxed

## 2018-12-04 ENCOUNTER — Other Ambulatory Visit: Payer: Self-pay

## 2018-12-04 MED ORDER — SOLIFENACIN SUCCINATE 10 MG PO TABS: 10.0000 mg | ORAL_TABLET | Freq: Every day | ORAL | 3 refills | Status: DC

## 2018-12-12 ENCOUNTER — Other Ambulatory Visit: Payer: Self-pay | Admitting: *Deleted

## 2018-12-12 MED ORDER — SOLIFENACIN SUCCINATE 10 MG PO TABS: 10.0000 mg | ORAL_TABLET | Freq: Every day | ORAL | 3 refills | Status: DC

## 2018-12-12 NOTE — Telephone Encounter (Signed)
Received appeal determination.   Appeal approved 11/02/2018- 11/02/2019.

## 2018-12-27 ENCOUNTER — Ambulatory Visit: Payer: Medicare Other

## 2018-12-29 ENCOUNTER — Ambulatory Visit (INDEPENDENT_AMBULATORY_CARE_PROVIDER_SITE_OTHER): Payer: Medicare Other

## 2018-12-29 ENCOUNTER — Other Ambulatory Visit: Payer: Self-pay

## 2018-12-29 DIAGNOSIS — Z23 Encounter for immunization: Secondary | ICD-10-CM

## 2019-01-12 ENCOUNTER — Other Ambulatory Visit: Payer: Self-pay

## 2019-01-12 ENCOUNTER — Ambulatory Visit (INDEPENDENT_AMBULATORY_CARE_PROVIDER_SITE_OTHER): Payer: Medicare Other | Admitting: Family Medicine

## 2019-01-12 VITALS — BP 142/78 | HR 83 | Temp 98.3°F | Resp 18 | Ht 61.0 in | Wt 118.0 lb

## 2019-01-12 DIAGNOSIS — W548XXA Other contact with dog, initial encounter: Secondary | ICD-10-CM | POA: Diagnosis not present

## 2019-01-12 DIAGNOSIS — Z23 Encounter for immunization: Secondary | ICD-10-CM | POA: Diagnosis not present

## 2019-01-12 DIAGNOSIS — S79922A Unspecified injury of left thigh, initial encounter: Secondary | ICD-10-CM | POA: Diagnosis not present

## 2019-01-12 MED ORDER — SULFAMETHOXAZOLE-TRIMETHOPRIM 800-160 MG PO TABS: 1.0000 | ORAL_TABLET | Freq: Two times a day (BID) | ORAL | 0 refills | Status: DC

## 2019-01-12 NOTE — Progress Notes (Signed)
Subjective:    Patient ID: Shirley Bates, female    DOB: 1926/01/14, 85 y.o.   MRN: [id number]  HPI Patient is a very pleasant 85 year old Caucasian female who on Wednesday suffered an injury to the lateral aspect of her left lateral upper thigh.  A neighborhood dog, jumped on her.  The dog knocked her down by accident.  She suffered a puncture wound to the left lateral upper thigh.  There was no bite.  The puncture wound was an accidental scratch.  On examination today, there is a 1 cm long diagonal scratch on the upper lateral left thigh just below the greater trochanter.  There is no erythema.  There is no warmth.  There is no pain.  There is no purulent drainage.  She does have a palpable hematoma just above that however this area is not tender or painful.  She has normal range of motion in the left hip without any pain.  Her daughter just wanted it checked in case it was getting infected.  They have been cleaning it thoroughly with alcohol and Betadine and keeping it dressed.  She is due for a tetanus shot. Past Medical History:  Diagnosis Date  . Basal cell carcinoma of leg    left 2014  . Hyperlipidemia   . Hypertension    No past surgical history on file. Current Outpatient Medications on File Prior to Visit  Medication Sig Dispense Refill  . alendronate (FOSAMAX) 70 MG tablet Take 1 tablet (70 mg total) by mouth every 7 (seven) days. Take with a full glass of water on an empty stomach. 12 tablet 3  . aspirin 81 MG tablet Take 81 mg by mouth daily.    Marland Kitchen losartan (COZAAR) 100 MG tablet TAKE 1 TABLET BY MOUTH DAILY. GENERIC EQUIVALENT FOR COZAAR 90 tablet 3  . multivitamin-iron-minerals-folic acid (CENTRUM) chewable tablet Chew 1 tablet by mouth daily.    . Omega-3 Fatty Acids (FISH OIL) 1200 MG CAPS Take 1,200 mg by mouth every morning.     . simvastatin (ZOCOR) 20 MG tablet Take 1 tablet (20 mg total) by mouth at bedtime. 90 tablet 3  . solifenacin (VESICARE) 10 MG tablet Take 1  tablet (10 mg total) by mouth daily. (Patient not taking: Reported on 01/12/2019) 30 tablet 3   No current facility-administered medications on file prior to visit.    Allergies  Allergen Reactions  . Penicillins    Social History   Socioeconomic History  . Marital status: Divorced    Spouse name: Not on file  . Number of children: Not on file  . Years of education: Not on file  . Highest education level: Not on file  Occupational History  . Not on file  Social Needs  . Financial resource strain: Not on file  . Food insecurity    Worry: Not on file    Inability: Not on file  . Transportation needs    Medical: Not on file    Non-medical: Not on file  Tobacco Use  . Smoking status: Never Smoker  . Smokeless tobacco: Never Used  Substance and Sexual Activity  . Alcohol use: No  . Drug use: No  . Sexual activity: Never  Lifestyle  . Physical activity    Days per week: Not on file    Minutes per session: Not on file  . Stress: Not on file  Relationships  . Social connections    Talks on phone: Not on file  Gets together: Not on file    Attends religious service: Not on file    Active member of club or organization: Not on file    Attends meetings of clubs or organizations: Not on file    Relationship status: Not on file  . Intimate partner violence    Fear of current or ex partner: Not on file    Emotionally abused: Not on file    Physically abused: Not on file    Forced sexual activity: Not on file  Other Topics Concern  . Not on file  Social History Narrative  . Not on file      Review of Systems  All other systems reviewed and are negative.      Objective:   Physical Exam Constitutional:      Appearance: Normal appearance.  Cardiovascular:     Rate and Rhythm: Normal rate and regular rhythm.  Pulmonary:     Effort: Pulmonary effort is normal.     Breath sounds: Normal breath sounds.  Musculoskeletal:     Left hip: Normal.     Left upper leg:  She exhibits laceration. She exhibits no tenderness, no bony tenderness, no swelling and no edema.       Legs:  Neurological:     Mental Status: She is alert.           Assessment & Plan:  Dog scratch  Patient has a benign dog scratch.  There is no evidence of secondary cellulitis.  I did update her tetanus shot.  If the area becomes infected, we will need to start antibiotics for cellulitis.  I gave her daughter a prescription for Bactrim double strength tablets twice daily for 7 days.  I gave them strict instructions on when to start the medication.  At the present time there is no evidence of cellulitis.  The daughter will check it over the weekend and notify me if it worsens.

## 2019-01-12 NOTE — Addendum Note (Signed)
Addended by: Vanice Sarah on: 01/12/2019 10:57 AM   Modules accepted: Orders

## 2019-01-19 NOTE — Progress Notes (Signed)
Shirley Bates, It has been a while since we have had to cancel a submission through [employer]. We reversed the claim but not sure if this will cancel it.

## 2019-01-26 ENCOUNTER — Other Ambulatory Visit: Payer: Self-pay

## 2019-01-26 ENCOUNTER — Encounter: Payer: Self-pay | Admitting: Family Medicine

## 2019-01-26 ENCOUNTER — Ambulatory Visit (INDEPENDENT_AMBULATORY_CARE_PROVIDER_SITE_OTHER): Payer: Medicare Other | Admitting: Family Medicine

## 2019-01-26 VITALS — BP 146/80 | HR 109 | Temp 97.8°F | Resp 16 | Ht 61.25 in | Wt 115.0 lb

## 2019-01-26 DIAGNOSIS — R410 Disorientation, unspecified: Secondary | ICD-10-CM | POA: Diagnosis not present

## 2019-01-26 DIAGNOSIS — R296 Repeated falls: Secondary | ICD-10-CM

## 2019-01-26 LAB — URINALYSIS, ROUTINE W REFLEX MICROSCOPIC
Bilirubin Urine: NEGATIVE
Glucose, UA: NEGATIVE
Hgb urine dipstick: NEGATIVE
Ketones, ur: NEGATIVE
Nitrite: POSITIVE — AB
Protein, ur: NEGATIVE
Specific Gravity, Urine: 1.015 (ref 1.001–1.03)
pH: 7 (ref 5.0–8.0)

## 2019-01-26 LAB — MICROSCOPIC MESSAGE

## 2019-01-26 MED ORDER — CIPROFLOXACIN HCL 250 MG PO TABS: 250.0000 mg | ORAL_TABLET | Freq: Two times a day (BID) | ORAL | 0 refills | Status: DC

## 2019-01-26 NOTE — Progress Notes (Signed)
Subjective:    Patient ID: Shirley Bates, female    DOB: [birth year], 85 y.o.   MRN: [id number]  HPI Patient is a very pleasant 85 year old Caucasian female who presents today with her daughter.  Her daughter drafted a letter for me to read prior to the office visit.    On Sunday, November 1, the patient got up to go to the bathroom and fell on the floor.  Daughter, who has cameras installed in the patient's house, saw her crawling from the bathroom to the phone beside the bed.  The patient's grandson went directly to the house to help get her up.  He stated that she was extremely confused.  Her legs were weak.  After sleeping for 2 hours, the patient seemed more coherent.  She was able to get up out of bed several times walking around the house and seemed to be doing better.  The patient's daughter stayed with her throughout that day.  The patient's daughter left her later that afternoon.  Around 8 PM the patient fell on the floor again.  The patient's daughter and her husband went over to her house to help her.  The patient was very confused.  She thought that it was the morning even though it was the evening..  Later this week on Wednesday (11/4), the patient began to press her life alert pendant for no reason.  They called her but she did not need any help.  She repeated this  4 separate times.  The company calling the patient really upset her.  She did not understand why they were her.  Therefore she walked across the road from her home to the neighbor's house to ask for help.  Over the last few days she seems more confused.  Patient denies any cough.  She denies any chest pain.  She denies any shortness of breath.  Cranial nerves II through XII are grossly intact.  Muscle strength is 5/5 equal and symmetric in the upper and lower extremities.  She does have a hematoma over her posterior lateral right thigh from where the dog scratch occurred a few weeks ago however there is no evidence of cellulitis  or secondary infection.  This is slowly reabsorbing.  She denies any abdominal pain.  She denies any nausea or vomiting.  She does report increased urinary frequency but denies dysuria.  Urinalysis today shows positive nitrites and 3+ leukocyte esterase.  Patient denies any fever chills but she does have some mild right-sided CVA tenderness Past Medical History:  Diagnosis Date  . Basal cell carcinoma of leg    left 2014  . Hyperlipidemia   . Hypertension    No past surgical history on file. Current Outpatient Medications on File Prior to Visit  Medication Sig Dispense Refill  . alendronate (FOSAMAX) 70 MG tablet Take 1 tablet (70 mg total) by mouth every 7 (seven) days. Take with a full glass of water on an empty stomach. 12 tablet 3  . aspirin 81 MG tablet Take 81 mg by mouth daily.    Marland Kitchen losartan (COZAAR) 100 MG tablet TAKE 1 TABLET BY MOUTH DAILY. GENERIC EQUIVALENT FOR COZAAR 90 tablet 3  . multivitamin-iron-minerals-folic acid (CENTRUM) chewable tablet Chew 1 tablet by mouth daily.    . Omega-3 Fatty Acids (FISH OIL) 1200 MG CAPS Take 1,200 mg by mouth every morning.     . simvastatin (ZOCOR) 20 MG tablet Take 1 tablet (20 mg total) by mouth at bedtime. 90 tablet  3  . solifenacin (VESICARE) 10 MG tablet Take 1 tablet (10 mg total) by mouth daily. (Patient not taking: Reported on 01/12/2019) 30 tablet 3  . sulfamethoxazole-trimethoprim (BACTRIM DS) 800-160 MG tablet Take 1 tablet by mouth 2 (two) times daily. 14 tablet 0   No current facility-administered medications on file prior to visit.    Allergies  Allergen Reactions  . Penicillins    Social History   Socioeconomic History  . Marital status: Divorced    Spouse name: Not on file  . Number of children: Not on file  . Years of education: Not on file  . Highest education level: Not on file  Occupational History  . Not on file  Social Needs  . Financial resource strain: Not on file  . Food insecurity    Worry: Not on file     Inability: Not on file  . Transportation needs    Medical: Not on file    Non-medical: Not on file  Tobacco Use  . Smoking status: Never Smoker  . Smokeless tobacco: Never Used  Substance and Sexual Activity  . Alcohol use: No  . Drug use: No  . Sexual activity: Never  Lifestyle  . Physical activity    Days per week: Not on file    Minutes per session: Not on file  . Stress: Not on file  Relationships  . Social Herbalist on phone: Not on file    Gets together: Not on file    Attends religious service: Not on file    Active member of club or organization: Not on file    Attends meetings of clubs or organizations: Not on file    Relationship status: Not on file  . Intimate partner violence    Fear of current or ex partner: Not on file    Emotionally abused: Not on file    Physically abused: Not on file    Forced sexual activity: Not on file  Other Topics Concern  . Not on file  Social History Narrative  . Not on file      Review of Systems  All other systems reviewed and are negative.      Objective:   Physical Exam Constitutional:      General: She is not in acute distress.    Appearance: Normal appearance. She is not ill-appearing, toxic-appearing or diaphoretic.  Neck:     Musculoskeletal: Neck supple.  Cardiovascular:     Rate and Rhythm: Normal rate and regular rhythm.  Pulmonary:     Effort: Pulmonary effort is normal. No respiratory distress.     Breath sounds: Normal breath sounds. No stridor. No wheezing, rhonchi or rales.  Chest:     Chest wall: No tenderness.  Abdominal:     General: Abdomen is flat. Bowel sounds are normal. There is no distension.     Palpations: Abdomen is soft. There is no mass.     Tenderness: There is no abdominal tenderness. There is no guarding or rebound.     Hernia: No hernia is present.  Lymphadenopathy:     Cervical: No cervical adenopathy.  Neurological:     General: No focal deficit present.      Mental Status: She is alert and oriented to person, place, and time.     Cranial Nerves: No cranial nerve deficit.     Sensory: No sensory deficit.     Motor: No weakness.     Coordination: Coordination normal.  Deep Tendon Reflexes: Reflexes normal.           Assessment & Plan:  Confusion - Plan: CBC with Differential, COMPLETE METABOLIC PANEL WITH GFR, Urinalysis, Routine w reflex microscopic  Falls frequently  There is been a change in the patient's mental status.  She is slightly more confused and a little bit delirious.  She also seems more unsteady on her feet and is falling more frequently.  Neurologic exam today is completely normal with no evidence of a stroke.  Therefore differential diagnosis includes a mini stroke that is difficult to diagnose and would require an MRI to determine versus a bladder infection.  Urinalysis seems to support a bladder infection.  I will send a urine culture but meanwhile start the patient on Cipro 250 mg p.o. twice daily for 5 days and await the results of the urine culture.  If symptoms worsen I would recommend an MRI.

## 2019-01-28 LAB — URINE CULTURE
MICRO NUMBER:: 1073521
SPECIMEN QUALITY:: ADEQUATE

## 2019-01-29 ENCOUNTER — Other Ambulatory Visit: Payer: Self-pay | Admitting: Family Medicine

## 2019-01-29 MED ORDER — NITROFURANTOIN MONOHYD MACRO 100 MG PO CAPS: 100.0000 mg | ORAL_CAPSULE | Freq: Two times a day (BID) | ORAL | 0 refills | Status: DC

## 2019-02-02 ENCOUNTER — Encounter: Payer: Self-pay | Admitting: Podiatry

## 2019-02-02 ENCOUNTER — Other Ambulatory Visit: Payer: Self-pay

## 2019-02-02 ENCOUNTER — Ambulatory Visit: Payer: Medicare Other | Admitting: Podiatry

## 2019-02-02 VITALS — BP 168/84 | HR 76

## 2019-02-02 DIAGNOSIS — L84 Corns and callosities: Secondary | ICD-10-CM

## 2019-02-02 DIAGNOSIS — Z89421 Acquired absence of other right toe(s): Secondary | ICD-10-CM | POA: Diagnosis not present

## 2019-02-02 DIAGNOSIS — B351 Tinea unguium: Secondary | ICD-10-CM

## 2019-02-02 DIAGNOSIS — M79674 Pain in right toe(s): Secondary | ICD-10-CM | POA: Diagnosis not present

## 2019-02-02 DIAGNOSIS — M79675 Pain in left toe(s): Secondary | ICD-10-CM

## 2019-02-02 MED ORDER — NONFORMULARY OR COMPOUNDED ITEM: 3 refills | Status: DC

## 2019-02-02 NOTE — Patient Instructions (Addendum)
[facility] [phone]   [email or web address]    Onychomycosis/Fungal Toenails  WHAT IS IT? An infection that lies within the keratin of your nail plate that is caused by a fungus.  WHY ME? Fungal infections affect all ages, sexes, races, and creeds.  There may be many factors that predispose you to a fungal infection such as age, coexisting medical conditions such as diabetes, or an autoimmune disease; stress, medications, fatigue, genetics, etc.  Bottom line: fungus thrives in a warm, moist environment and your shoes offer such a location.  IS IT CONTAGIOUS? Theoretically, yes.  You do not want to share shoes, nail clippers or files with someone who has fungal toenails.  Walking around barefoot in the same room or sleeping in the same bed is unlikely to transfer the organism.  It is important to realize, however, that fungus can spread easily from one nail to the next on the same foot.  HOW DO WE TREAT THIS?  There are several ways to treat this condition.  Treatment may depend on many factors such as age, medications, pregnancy, liver and kidney conditions, etc.  It is best to ask your doctor which options are available to you.  1. No treatment.   Unlike many other medical concerns, you can live with this condition.  However for many people this can be a painful condition and may lead to ingrown toenails or a bacterial infection.  It is recommended that you keep the nails cut short to help reduce the amount of fungal nail. 2. Topical treatment.  These range from herbal remedies to prescription strength nail lacquers.  About 40-50% effective, topicals require twice daily application for approximately 9 to 12 months or until an entirely new nail has grown out.  The most effective topicals are medical grade medications available through physicians offices. 3. Oral antifungal medications.  With an 80-90% cure rate, the most common oral medication requires 3 to 4 months of therapy and  stays in your system for a year as the new nail grows out.  Oral antifungal medications do require blood work to make sure it is a safe drug for you.  A liver function panel will be performed prior to starting the medication and after the first month of treatment.  It is important to have the blood work performed to avoid any harmful side effects.  In general, this medication safe but blood work is required. 4. Laser Therapy.  This treatment is performed by applying a specialized laser to the affected nail plate.  This therapy is noninvasive, fast, and non-painful.  It is not covered by insurance and is therefore, out of pocket.  The results have been very good with a 80-95% cure rate.  [facility] is the only practice in the area to offer this therapy. 5. Permanent Nail Avulsion.  Removing the entire nail so that a new nail will not grow back.

## 2019-02-05 ENCOUNTER — Telehealth: Payer: Self-pay | Admitting: Family Medicine

## 2019-02-05 NOTE — Telephone Encounter (Signed)
She can just bring a sample

## 2019-02-05 NOTE — Telephone Encounter (Signed)
Patients daughter called states she took her last medication for her bladder infection this morning. She wants to know does she need a follow up appointment or do they just bring a sample by to make sure the infection has went away.  CB# [phone]

## 2019-02-06 ENCOUNTER — Other Ambulatory Visit: Payer: Self-pay | Admitting: Family Medicine

## 2019-02-06 DIAGNOSIS — N390 Urinary tract infection, site not specified: Secondary | ICD-10-CM

## 2019-02-06 NOTE — Telephone Encounter (Signed)
Shirley Bates aware via vm

## 2019-02-07 ENCOUNTER — Other Ambulatory Visit: Payer: Self-pay

## 2019-02-07 ENCOUNTER — Other Ambulatory Visit: Payer: Medicare Other

## 2019-02-07 ENCOUNTER — Other Ambulatory Visit: Payer: Self-pay | Admitting: Family Medicine

## 2019-02-07 DIAGNOSIS — N390 Urinary tract infection, site not specified: Secondary | ICD-10-CM | POA: Diagnosis not present

## 2019-02-07 LAB — URINALYSIS, ROUTINE W REFLEX MICROSCOPIC
Bilirubin Urine: NEGATIVE
Glucose, UA: NEGATIVE
Ketones, ur: NEGATIVE
Nitrite: POSITIVE — AB
Protein, ur: NEGATIVE
Specific Gravity, Urine: 1.015 (ref 1.001–1.03)
pH: 7 (ref 5.0–8.0)

## 2019-02-07 LAB — MICROSCOPIC MESSAGE

## 2019-02-08 ENCOUNTER — Other Ambulatory Visit: Payer: Self-pay

## 2019-02-08 MED ORDER — SULFAMETHOXAZOLE-TRIMETHOPRIM 800-160 MG PO TABS: 1.0000 | ORAL_TABLET | Freq: Two times a day (BID) | ORAL | 0 refills | Status: DC

## 2019-02-09 ENCOUNTER — Other Ambulatory Visit: Payer: Self-pay | Admitting: Family Medicine

## 2019-02-09 ENCOUNTER — Encounter: Payer: Self-pay | Admitting: Podiatry

## 2019-02-09 LAB — URINE CULTURE
MICRO NUMBER:: 1114576
SPECIMEN QUALITY:: ADEQUATE

## 2019-02-09 MED ORDER — NITROFURANTOIN MONOHYD MACRO 100 MG PO CAPS: 100.0000 mg | ORAL_CAPSULE | Freq: Two times a day (BID) | ORAL | 0 refills | Status: DC

## 2019-02-09 NOTE — Progress Notes (Signed)
Subjective: Shirley Bates presents today referred by Susy Frizzle, MD with cc of painful, discolored, thick toenails which she cannot bend over and cut. Duration greater than one month. Pain does interfere with daily activities.  Pain is aggravated when wearing enclosed shoe gear.   She has h/o amputation right 2nd digit.  Past Medical History:  Diagnosis Date  . Basal cell carcinoma of leg    left 2014  . Hyperlipidemia   . Hypertension      Patient Active Problem List   Diagnosis Date Noted  . Osteoporosis 05/28/2015  . Left ear impacted cerumen 12/12/2012  . OA (osteoarthritis) of neck 12/12/2012  . Hyperlipidemia   . Hypertension     Past Surgical History:  Procedure Laterality Date  . 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 alendronate (FOSAMAX) 70 MG tablet Take 1 tablet (70 mg total) by mouth every 7 (seven) days. Take with a full glass of water on an empty stomach. 12 tablet 3  . aspirin 81 MG tablet Take 81 mg by mouth daily.    Marland Kitchen losartan (COZAAR) 100 MG tablet TAKE 1 TABLET BY MOUTH DAILY. GENERIC EQUIVALENT FOR COZAAR 90 tablet 3  . multivitamin-iron-minerals-folic acid (CENTRUM) chewable tablet Chew 1 tablet by mouth daily.    . nitrofurantoin, macrocrystal-monohydrate, (MACROBID) 100 MG capsule Take 1 capsule (100 mg total) by mouth 2 (two) times daily. Stop cipro. 14 capsule 0  . Omega-3 Fatty Acids (FISH OIL) 1200 MG CAPS Take 1,200 mg by mouth every morning.      No current facility-administered medications on file prior to visit.      Allergies  Allergen Reactions  . Penicillins      Social History   Occupational History  . Not on file  Tobacco Use  . Smoking status: Never Smoker  . Smokeless tobacco: Never Used  Substance and Sexual Activity  . Alcohol use: No  . Drug use: No  . Sexual activity: Never     Family History  Problem Relation Age of  Onset  . Heart disease Father   . Heart disease Sister   . Heart disease Brother   . Hypertension Brother   . Breast cancer Neg Hx      Immunization History  Administered Date(s) Administered  . Fluad Quad(high Dose 65+) 12/29/2018  . Influenza, High Dose Seasonal PF 12/16/2017  . Influenza,inj,Quad PF,6+ Mos 12/12/2012, 01/23/2014, 01/21/2015, 02/02/2016  . Influenza-Unspecified 01/07/2017  . Pneumococcal Conjugate-13 04/27/2013  . Pneumococcal Polysaccharide-23 03/13/2003  . Td 12/31/2008  . Tdap 01/12/2019    Review of systems: Positive Findings in bold print.  Constitutional:  chills, fatigue, fever, sweats, weight change Communication: Optometrist, sign Ecologist, hand writing, iPad/Android device Head: headaches, head injury Eyes: changes in vision, eye pain, glaucoma, cataracts, macular degeneration, diplopia, glare,  light sensitivity, eyeglasses or contacts, blindness Ears nose mouth throat: hearing impaired, hearing aids,  ringing in ears, deaf, sign language,  vertigo,   nosebleeds,  rhinitis,  cold sores, snoring, swollen glands Cardiovascular: HTN, edema, arrhythmia, pacemaker in place, defibrillator in place, chest pain/tightness, chronic anticoagulation, blood clot, heart failure, MI Peripheral Vascular: leg cramps, varicose veins, blood clots, lymphedema, varicosities Respiratory:  difficulty breathing, denies congestion, SOB, wheezing, cough, emphysema Gastrointestinal: change in appetite or weight, abdominal pain, constipation, diarrhea, nausea, vomiting, vomiting blood, change in bowel habits, abdominal pain, jaundice, rectal bleeding, hemorrhoids,  GERD Genitourinary:  nocturia,  pain on urination, polyuria,  blood in urine, Foley catheter, urinary urgency, ESRD on hemodialysis Musculoskeletal: amputation, cramping, stiff joints, painful joints, decreased joint motion, fractures, OA, gout, hemiplegia, paraplegia, uses cane, wheelchair bound, uses walker,  uses rollator Skin: +changes in toenails, color change, dryness, itching, mole changes,  rash, wound(s) Neurological: headaches, numbness in feet, paresthesias in feet, burning in feet, fainting,  seizures, change in speech. denies headaches, memory problems/poor historian, cerebral palsy, weakness, paralysis, CVA, TIA Endocrine: diabetes, hypothyroidism, hyperthyroidism,  goiter, dry mouth, flushing, heat intolerance,  cold intolerance,  excessive thirst, denies polyuria,  nocturia Hematological:  easy bleeding, excessive bleeding, easy bruising, enlarged lymph nodes, on long term blood thinner, history of past transusions Allergy/immunological:  hives, eczema, frequent infections, multiple drug allergies, seasonal allergies, transplant recipient, multiple food allergies Psychiatric:  anxiety, depression, mood disorder, suicidal ideations, hallucinations, insomnia  Objective: Vitals:   02/02/19 1507  BP: (!) 168/84  Pulse: 76    Vascular Examination: Capillary refill time immediate x 10 digits.  Dorsalis pedis pulses palpable b/l.   Posterior tibial pulses palpable b/l.   Sparse digital hair x 10 digits.  Skin temperature gradient WNL b/l.  Dermatological Examination: Skin with normal turgor, texture and tone b/l.  Toenails 1-5 left, right great toe and 3-5 right foot discolored, thick, dystrophic with subungual debris and pain with palpation to nailbeds due to thickness of nails.  Hyperkeratotic lesion submet head  5 left foot with tenderness to palpation. No edema, no erythema, no drainage, no flocculence.  Musculoskeletal: Muscle strength 5/5 to all LE muscle groups b/l.  Amputation right 2nd digit.  Neurological: Sensation intact 5/5 b/l with 10 gram monofilament.  Vibratory sensation intact b/l.   Assessment: 1. Painful onychomycosis toenails 1-5 left, right great toe and 3-5 right foot 2. Callus submet head 5 left foot 3. 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 Discussed onychomycosis and treatment options.  Literature dispensed on today. Discussed topical, laser and oral medication. Patient opted for topical treatment with compounded medication. Rx written for nonformulary compounding topical antifungal: [facility]: Antifungal cream - Terbinafine 3%, Fluconazole 2%, Tea Tree Oil 5%, Urea 10%, Ibuprofen 2% in DMSO Suspension #34ml. Apply to the affected nail(s) at bedtime. 2. Toenails 1-5 left, right great toe and 3-5 right foot  were debrided in length and girth without iatrogenic bleeding.  3. Calluses pared submetatarsal head 5 left foot utilizing sterile scalpel blade without incident. 4. Patient to continue soft, supportive shoe gear daily. 5. Patient to report any pedal injuries to medical professional immediately. 6. Follow up 3 months.  7. Patient/POA to call should there be a concern in the interim.

## 2019-02-20 ENCOUNTER — Other Ambulatory Visit: Payer: Medicare Other

## 2019-02-20 ENCOUNTER — Other Ambulatory Visit: Payer: Self-pay

## 2019-02-20 DIAGNOSIS — N390 Urinary tract infection, site not specified: Secondary | ICD-10-CM | POA: Diagnosis not present

## 2019-02-20 LAB — URINALYSIS, ROUTINE W REFLEX MICROSCOPIC
Bilirubin Urine: NEGATIVE
Glucose, UA: NEGATIVE
Hgb urine dipstick: NEGATIVE
Ketones, ur: NEGATIVE
Leukocytes,Ua: NEGATIVE
Nitrite: NEGATIVE
Protein, ur: NEGATIVE
Specific Gravity, Urine: 1.02 (ref 1.001–1.03)
pH: 7 (ref 5.0–8.0)

## 2019-02-22 LAB — URINE CULTURE
MICRO NUMBER:: 1150730
Result:: NO GROWTH
SPECIMEN QUALITY:: ADEQUATE

## 2019-03-19 ENCOUNTER — Other Ambulatory Visit: Payer: Self-pay

## 2019-03-19 ENCOUNTER — Encounter: Payer: Self-pay | Admitting: Family Medicine

## 2019-03-19 ENCOUNTER — Ambulatory Visit (INDEPENDENT_AMBULATORY_CARE_PROVIDER_SITE_OTHER): Payer: Medicare Other | Admitting: Family Medicine

## 2019-03-19 VITALS — BP 132/80 | HR 92 | Temp 98.1°F | Resp 16 | Ht 61.25 in | Wt 112.0 lb

## 2019-03-19 DIAGNOSIS — R410 Disorientation, unspecified: Secondary | ICD-10-CM | POA: Diagnosis not present

## 2019-03-19 DIAGNOSIS — R002 Palpitations: Secondary | ICD-10-CM

## 2019-03-19 LAB — URINALYSIS, ROUTINE W REFLEX MICROSCOPIC
Bilirubin Urine: NEGATIVE
Glucose, UA: NEGATIVE
Hyaline Cast: NONE SEEN /LPF
Ketones, ur: NEGATIVE
Nitrite: NEGATIVE
Protein, ur: NEGATIVE
Specific Gravity, Urine: 1.02 (ref 1.001–1.03)
pH: 6 (ref 5.0–8.0)

## 2019-03-19 LAB — MICROSCOPIC MESSAGE

## 2019-03-19 MED ORDER — NITROFURANTOIN MONOHYD MACRO 100 MG PO CAPS: 100.0000 mg | ORAL_CAPSULE | Freq: Two times a day (BID) | ORAL | 0 refills | Status: DC

## 2019-03-19 MED ORDER — SULFAMETHOXAZOLE-TRIMETHOPRIM 800-160 MG PO TABS: 1.0000 | ORAL_TABLET | Freq: Two times a day (BID) | ORAL | 0 refills | Status: DC

## 2019-03-19 NOTE — Addendum Note (Signed)
Addended by: Launa Grill on: 03/19/2019 03:57 PM   Modules accepted: Orders

## 2019-03-19 NOTE — Addendum Note (Signed)
Addended by: Launa Grill on: 03/19/2019 03:35 PM   Modules accepted: Orders

## 2019-03-19 NOTE — Progress Notes (Signed)
Subjective:    Patient ID: Shirley Bates, female    DOB: [birth year], 85 y.o.   MRN: [id number]  HPI  01/26/19 Patient is a very pleasant 85 year old Caucasian female who presents today with her daughter.  Her daughter drafted a letter for me to read prior to the office visit.    On Sunday, November 1, the patient got up to go to the bathroom and fell on the floor.  Daughter, who has cameras installed in the patient's house, saw her crawling from the bathroom to the phone beside the bed.  The patient's grandson went directly to the house to help get her up.  He stated that she was extremely confused.  Her legs were weak.  After sleeping for 2 hours, the patient seemed more coherent.  She was able to get up out of bed several times walking around the house and seemed to be doing better.  The patient's daughter stayed with her throughout that day.  The patient's daughter left her later that afternoon.  Around 8 PM the patient fell on the floor again.  The patient's daughter and her husband went over to her house to help her.  The patient was very confused.  She thought that it was the morning even though it was the evening..  Later this week on Wednesday (11/4), the patient began to press her life alert pendant for no reason.  They called her but she did not need any help.  She repeated this  4 separate times.  The company calling the patient really upset her.  She did not understand why they were her.  Therefore she walked across the road from her home to the neighbor's house to ask for help.  Over the last few days she seems more confused.  Patient denies any cough.  She denies any chest pain.  She denies any shortness of breath.  Cranial nerves II through XII are grossly intact.  Muscle strength is 5/5 equal and symmetric in the upper and lower extremities.  She does have a hematoma over her posterior lateral right thigh from where the dog scratch occurred a few weeks ago however there is no evidence of  cellulitis or secondary infection.  This is slowly reabsorbing.  She denies any abdominal pain.  She denies any nausea or vomiting.  She does report increased urinary frequency but denies dysuria.  Urinalysis today shows positive nitrites and 3+ leukocyte esterase.  Patient denies any fever chills but she does have some mild right-sided CVA tenderness.  At that time, my plan was: There is been a change in the patient's mental status.  She is slightly more confused and a little bit delirious.  She also seems more unsteady on her feet and is falling more frequently.  Neurologic exam today is completely normal with no evidence of a stroke.  Therefore differential diagnosis includes a mini stroke that is difficult to diagnose and would require an MRI to determine versus a bladder infection.  Urinalysis seems to support a bladder infection.  I will send a urine culture but meanwhile start the patient on Cipro 250 mg p.o. twice daily for 5 days and await the results of the urine culture.  If symptoms worsen I would recommend an MRI.  03/19/19 Patient had a clear urine culture on December 1.  She is here today with her daughter concerned that there may be a UTI.  Daughter states that over the last 5 days she has become increasingly more  confused.  She is taking medications at the wrong time a day.  She is waking up at 10:00 at night after going to bed at 8:00 and trying to get dressed to get ready for the day even though it is early evening.  She is also calling at odd hours asking her daughter when she is going to come pick her up.  This is been a noticeable change over the last 5 days.  Patient denies any chest pain.  She denies any fevers or chills.  She denies any shortness of breath.  She denies any nausea or vomiting.  She denies any diarrhea.  She denies any abdominal pain.  Urinalysis does show trace blood negative nitrites +1 leukocyte esterase.  Microscopic analysis shows a lot of white blood cells and a lot  of bacteria which certainly could be contamination.  Physical exam today however shows an irregularly irregular heart rhythm.  However EKG today shows only normal sinus rhythm with no evidence of ischemia.  There may be slight sinus arrhythmia.  Therefore I believe the abnormality I auscultated was likely a PVC or PAC. 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 Take with a full glass of water on an empty stomach. 12 tablet 3   aspirin 81 MG tablet Take 81 mg by mouth daily.     losartan (COZAAR) 100 MG tablet TAKE 1 TABLET BY MOUTH DAILY. GENERIC EQUIVALENT FOR COZAAR 90 tablet 3   multivitamin-iron-minerals-folic acid (CENTRUM) chewable tablet Chew 1 tablet by mouth daily.     nitrofurantoin, macrocrystal-monohydrate, (MACROBID) 100 MG capsule Take 1 capsule (100 mg total) by mouth 2 (two) times daily. Stop cipro. 14 capsule 0   nitrofurantoin, macrocrystal-monohydrate, (MACROBID) 100 MG capsule Take 1 capsule (100 mg total) by mouth 2 (two) times daily. 14 capsule 0   NONFORMULARY OR COMPOUNDED ITEM Antifungal solution: Terbinafine 3%, Fluconazole 2%, Tea Tree Oil 5%, Urea 10%, Ibuprofen 2% in DMSO suspension #5mL 1 each 3   Omega-3 Fatty Acids (FISH OIL) 1200 MG CAPS Take 1,200 mg by mouth every morning.      simvastatin (ZOCOR) 20 MG tablet TAKE 1 TABLET BY MOUTH AT BEDTIME 90 tablet 3   sulfamethoxazole-trimethoprim (BACTRIM DS) 800-160 MG tablet Take 1 tablet by mouth 2 (two) times daily. 14 tablet 0   No current facility-administered medications on file prior to visit.   Allergies  Allergen Reactions   Penicillins     Social History   Socioeconomic History   Marital status: Divorced    Spouse name: Not on file   Number of children: Not on file   Years of education: Not on file   Highest education level: Not on file  Occupational History   Not on file  Tobacco Use   Smoking status: Never Smoker   Smokeless tobacco: Never Used  Substance and Sexual Activity   Alcohol use: No   Drug use: No   Sexual activity: Never  Other Topics Concern   Not on file  Social History Narrative   Not on file   Social Determinants of  Health   Financial Resource Strain:    Difficulty of Paying Living Expenses: Not on file  Food Insecurity:    Worried About [place] in the Last Year: Not on file   Ran Out of Food in the Last Year: Not on file  Transportation Needs:    Lack of Transportation (Medical): Not on file   Lack of Transportation (Non-Medical): Not on file  Physical Activity:    Days of Exercise per Week: Not on file   Minutes of Exercise per Session: Not on file  Stress:    Feeling of Stress : Not on file  Social Connections:    Frequency of Communication with Friends and Family: Not on file   Frequency of Social Gatherings with Friends and Family: Not on file   Attends Religious Services: Not on file   Active Member of Clubs or Organizations: Not on file   Attends Archivist Meetings: Not on file   Marital Status: Not on file  Intimate Partner Violence:    Fear of Current or Ex-Partner: Not on file   Emotionally Abused: Not on file   Physically Abused: Not on file   Sexually Abused: Not on file      Review of Systems  All other systems reviewed and are negative.      Objective:   Physical Exam Constitutional:      General: She is not in acute distress.    Appearance: Normal appearance. She is not ill-appearing, toxic-appearing or diaphoretic.  Cardiovascular:     Rate and Rhythm: Normal rate. Rhythm irregular.     Pulses:  Normal pulses.     Heart sounds: Normal heart sounds.  Pulmonary:     Effort: Pulmonary effort is normal. No respiratory distress.     Breath sounds: Normal breath sounds. No stridor. No wheezing, rhonchi or rales.  Chest:     Chest wall: No tenderness.  Abdominal:     General: Abdomen is flat. Bowel sounds are normal. There is no distension.     Palpations: Abdomen is soft. There is no mass.     Tenderness: There is no abdominal tenderness. There is no guarding or rebound.     Hernia: No hernia is present.  Musculoskeletal:     Cervical back: Neck supple.  Lymphadenopathy:     Cervical: No cervical adenopathy.  Neurological:     General: No focal deficit present.     Mental Status: She is alert and oriented to person, place, and time.     Cranial Nerves: No cranial nerve deficit.     Sensory: No sensory deficit.     Motor: No weakness.     Coordination: Coordination normal.     Deep Tendon Reflexes: Reflexes normal.           Assessment & Plan:  Palpitations - Plan: EKG 12-Lead  Confusion  Appears to have a urinary tract infection.  Await results urine culture.  Meanwhile empirically treat the patient with Macrobid 100 mg twice daily for 7 days based on her previous culture and sensitivity results.

## 2019-03-21 LAB — URINE CULTURE
MICRO NUMBER:: 1232937
SPECIMEN QUALITY:: ADEQUATE

## 2019-03-27 ENCOUNTER — Telehealth: Payer: Self-pay | Admitting: Family Medicine

## 2019-03-27 NOTE — Telephone Encounter (Signed)
Mickel Baas called and wanted to know if she needed to bring the pt in for a follow up apt and urine check or can she just bring in a urine to have rechecked as she had to have 2 doses of antibx last time to get rid of the UTI?   CB# [phone]

## 2019-03-27 NOTE — Telephone Encounter (Signed)
Ok just to bring urine by for urine culture.

## 2019-03-28 ENCOUNTER — Other Ambulatory Visit: Payer: Self-pay

## 2019-03-28 ENCOUNTER — Other Ambulatory Visit: Payer: Medicare Other

## 2019-03-28 DIAGNOSIS — N390 Urinary tract infection, site not specified: Secondary | ICD-10-CM

## 2019-03-28 LAB — URINALYSIS, ROUTINE W REFLEX MICROSCOPIC
Bilirubin Urine: NEGATIVE
Glucose, UA: NEGATIVE
Ketones, ur: NEGATIVE
Nitrite: NEGATIVE
Protein, ur: NEGATIVE
Specific Gravity, Urine: 1.025 (ref 1.001–1.03)
pH: 7 (ref 5.0–8.0)

## 2019-03-28 LAB — MICROSCOPIC MESSAGE

## 2019-03-28 NOTE — Telephone Encounter (Signed)
Pt brought a urine to office and orders placed.

## 2019-03-29 ENCOUNTER — Telehealth: Payer: Self-pay | Admitting: *Deleted

## 2019-03-29 NOTE — Telephone Encounter (Signed)
Received call from patient daughter, Mickel Baas.   Reports that patient had fall this AM with no apparent injury. Although, patient is noted to be weak and could not get up out of the floor. EMS was contacted and evaluated patient. Stated that they were able to freely move extremities without pain. Reports that patient is weak, but can bear weight on LE.   Appointment scheduled for evaluation.

## 2019-03-29 NOTE — Telephone Encounter (Signed)
[place] Shirley Bates

## 2019-03-30 ENCOUNTER — Encounter: Payer: Self-pay | Admitting: Family Medicine

## 2019-03-30 ENCOUNTER — Other Ambulatory Visit: Payer: Self-pay

## 2019-03-30 ENCOUNTER — Ambulatory Visit (INDEPENDENT_AMBULATORY_CARE_PROVIDER_SITE_OTHER): Payer: Medicare Other | Admitting: Family Medicine

## 2019-03-30 VITALS — BP 136/60 | HR 80 | Temp 96.7°F | Resp 16 | Ht 61.25 in

## 2019-03-30 DIAGNOSIS — R410 Disorientation, unspecified: Secondary | ICD-10-CM | POA: Diagnosis not present

## 2019-03-30 DIAGNOSIS — R296 Repeated falls: Secondary | ICD-10-CM | POA: Diagnosis not present

## 2019-03-30 DIAGNOSIS — N390 Urinary tract infection, site not specified: Secondary | ICD-10-CM | POA: Diagnosis not present

## 2019-03-30 DIAGNOSIS — R29898 Other symptoms and signs involving the musculoskeletal system: Secondary | ICD-10-CM

## 2019-03-30 LAB — URINE CULTURE
MICRO NUMBER:: 10013092
SPECIMEN QUALITY:: ADEQUATE

## 2019-03-30 MED ORDER — NITROFURANTOIN MONOHYD MACRO 100 MG PO CAPS: 100.0000 mg | ORAL_CAPSULE | Freq: Two times a day (BID) | ORAL | 0 refills | Status: DC

## 2019-03-30 NOTE — Progress Notes (Signed)
Subjective:    Patient ID: Shirley Bates, female    DOB: 1926-01-04, 86 y.o.   MRN: [id number]  HPI  01/26/19 Patient is a very pleasant 86 year old Caucasian female who presents today with her daughter.  Her daughter drafted a letter for me to read prior to the office visit.    On Sunday, November 1, the patient got up to go to the bathroom and fell on the floor.  Daughter, who has cameras installed in the patient's house, saw her crawling from the bathroom to the phone beside the bed.  The patient's grandson went directly to the house to help get her up.  He stated that she was extremely confused.  Her legs were weak.  After sleeping for 2 hours, the patient seemed more coherent.  She was able to get up out of bed several times walking around the house and seemed to be doing better.  The patient's daughter stayed with her throughout that day.  The patient's daughter left her later that afternoon.  Around 8 PM the patient fell on the floor again.  The patient's daughter and her husband went over to her house to help her.  The patient was very confused.  She thought that it was the morning even though it was the evening..  Later this week on Wednesday (11/4), the patient began to press her life alert pendant for no reason.  They called her but she did not need any help.  She repeated this  4 separate times.  The company calling the patient really upset her.  She did not understand why they were her.  Therefore she walked across the road from her home to the neighbor's house to ask for help.  Over the last few days she seems more confused.  Patient denies any cough.  She denies any chest pain.  She denies any shortness of breath.  Cranial nerves II through XII are grossly intact.  Muscle strength is 5/5 equal and symmetric in the upper and lower extremities.  She does have a hematoma over her posterior lateral right thigh from where the dog scratch occurred a few weeks ago however there is no evidence of  cellulitis or secondary infection.  This is slowly reabsorbing.  She denies any abdominal pain.  She denies any nausea or vomiting.  She does report increased urinary frequency but denies dysuria.  Urinalysis today shows positive nitrites and 3+ leukocyte esterase.  Patient denies any fever chills but she does have some mild right-sided CVA tenderness.  At that time, my plan was: There is been a change in the patient's mental status.  She is slightly more confused and a little bit delirious.  She also seems more unsteady on her feet and is falling more frequently.  Neurologic exam today is completely normal with no evidence of a stroke.  Therefore differential diagnosis includes a mini stroke that is difficult to diagnose and would require an MRI to determine versus a bladder infection.  Urinalysis seems to support a bladder infection.  I will send a urine culture but meanwhile start the patient on Cipro 250 mg p.o. twice daily for 5 days and await the results of the urine culture.  If symptoms worsen I would recommend an MRI.  03/19/19 Patient had a clear urine culture on December 1.  She is here today with her daughter concerned that there may be a UTI.  Daughter states that over the last 5 days she has become increasingly more  confused.  She is taking medications at the wrong time a day.  She is waking up at 10:00 at night after going to bed at 8:00 and trying to get dressed to get ready for the day even though it is early evening.  She is also calling at odd hours asking her daughter when she is going to come pick her up.  This is been a noticeable change over the last 5 days.  Patient denies any chest pain.  She denies any fevers or chills.  She denies any shortness of breath.  She denies any nausea or vomiting.  She denies any diarrhea.  She denies any abdominal pain.  Urinalysis does show trace blood negative nitrites +1 leukocyte esterase.  Microscopic analysis shows a lot of white blood cells and a lot  of bacteria which certainly could be contamination.  Physical exam today however shows an irregularly irregular heart rhythm.  However EKG today shows only normal sinus rhythm with no evidence of ischemia.  There may be slight sinus arrhythmia.  Therefore I believe the abnormality I auscultated was likely a PVC or PAC.  At that time, my plan was: Appears to have a urinary tract infection.  Await results urine culture.  Meanwhile empirically treat the patient with Macrobid 100 mg twice daily for 7 days based on her previous culture and sensitivity results.  03/30/19 Urine culture revealed E. coli with several resistant properties however it was sensitive to Macrobid which is what the patient took.  However repeat urine culture on January 6 confirm persistent presence of E. coli again sensitive to [place].  Please see most recent culture and sensitivity below: Lab on 03/28/2019  Component Date Value Ref Range Status  . MICRO NUMBER: 03/28/2019 [id number]   Final  . SPECIMEN QUALITY: 03/28/2019 Adequate   Final  . Sample Source 03/28/2019 NOT GIVEN   Final  . STATUS: 03/28/2019 FINAL   Final  . ISOLATE 1: 03/28/2019 Escherichia coli*  Final   Greater than 100,000 CFU/mL of Escherichia coli  . Color, Urine 03/28/2019 YELLOW  YELLOW Final  . APPearance 03/28/2019 CLOUDY* CLEAR Final  . Specific Gravity, Urine 03/28/2019 1.025  1.001 - 1.03 Final  . pH 03/28/2019 7.0  5.0 - 8.0 Final  . Glucose, UA 03/28/2019 NEGATIVE  NEGATIVE Final  . Bilirubin Urine 03/28/2019 NEGATIVE  NEGATIVE Final  . Ketones, ur 03/28/2019 NEGATIVE  NEGATIVE Final  . Hgb urine dipstick 03/28/2019 TRACE* NEGATIVE Final  . Protein, ur 03/28/2019 NEGATIVE  NEGATIVE Final  . Nitrite 03/28/2019 NEGATIVE  NEGATIVE Final  . Leukocytes,Ua 03/28/2019 3+* NEGATIVE Final  . WBC, UA 03/28/2019 CANCELED   Final   Comment: TEST NOT PERFORMED . Quantity not sufficient.  Result canceled by the ancillary.   . Note 03/28/2019    Final    Comment: This urine was analyzed for the presence of WBC,  RBC, bacteria, casts, and other formed elements.  Only those elements seen were reported. . .    Unfortunately, before we had the results of the urinary tract infection, the patient experienced a fall.  Her daughter was checking on her yesterday and she fell landing on her left flank and her left side.  She has a small bruise on her posterior superior aspect of her left shoulder.  This is roughly the diameter of a silver dollar.  She has full range of motion in her left shoulder without any pain.  She has no pain to palpation in her left ribs.  She has no pain to palpation over her sacrum or over her iliac crest bilaterally.  I am able to flex and extend her left hip without any pain.  She has no pain with internal or external rotation of her left hip.  I am able to flex and extend her left knee without any pain.  She has no tenderness to palpation along the femur or the tibia or the fibula on the left side.  She has full flexion and extension of the ankle without pain.  There is no swelling redness or bruising in the left foot.  There is no tenderness to palpation around the left foot.  Thankfully I do not feel that the patient fractured any bones.  She denies any pain today.  She is able to bear weight.  However she is very unsteady on her feet and weak.  Her daughter has noticed that she is unstable at home and questions if she would benefit from physical therapy.  However the urinalysis also suggests a persistent urinary tract infection.  I question if the patient may be colonized.  However the daughter states that approximately the fourth and fifth and sixth and seventh days that she was on the nitrofurantoin, the daughter experienced significant improvement in her mental status.  She seemed to brighten up.  She was more coherent.  She was doing chores around the house.  A day after stopping the antibiotics she started to experience a return in  her symptoms and it gradually worsened until she fell yesterday.  This to me suggest that she did have a partially treated urinary tract infection that did not totally clear leading to subsequent delirium.  However I do believe the patient has mild dementia that is exacerbated by a urinary tract infection that is also contributing. Past Medical History:  Diagnosis Date  . Basal cell carcinoma of leg    left 2014  . Hyperlipidemia   . Hypertension    Past Surgical History:  Procedure Laterality Date  . 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 alendronate (FOSAMAX) 70 MG tablet Take 1 tablet (70 mg total) by mouth every 7 (seven) days. Take with a full glass of water on an empty stomach. 12 tablet 3  . aspirin 81 MG tablet Take 81 mg by mouth daily.    Marland Kitchen losartan (COZAAR) 100 MG tablet TAKE 1 TABLET BY MOUTH DAILY. GENERIC EQUIVALENT FOR COZAAR 90 tablet 3  . multivitamin-iron-minerals-folic acid (CENTRUM) chewable tablet Chew 1 tablet by mouth daily.    . NONFORMULARY OR COMPOUNDED ITEM Antifungal solution: Terbinafine 3%, Fluconazole 2%, Tea Tree Oil 5%, Urea 10%, Ibuprofen 2% in DMSO suspension #96mL 1 each 3  . Omega-3 Fatty Acids (FISH OIL) 1200 MG CAPS Take 1,200 mg by mouth every morning.     . simvastatin (ZOCOR) 20 MG tablet TAKE 1 TABLET BY MOUTH AT BEDTIME 90 tablet 3   No current facility-administered medications on file prior to visit.   Allergies  Allergen Reactions  . Penicillins    Social History   Socioeconomic History  . Marital status: Divorced    Spouse name: Not on file  . Number of children: Not on file  . Years of education: Not on file  . Highest education level: Not on file  Occupational History  . Not on file  Tobacco Use  . Smoking status: Never Smoker  .  Smokeless tobacco: Never Used  Substance and Sexual Activity  . Alcohol use: No  . Drug use: No  . Sexual  activity: Never  Other Topics Concern  . Not on file  Social History Narrative  . Not on file   Social Determinants of Health   Financial Resource Strain:   . Difficulty of Paying Living Expenses: Not on file  Food Insecurity:   . Worried About Charity fundraiser in the Last Year: Not on file  . Ran Out of Food in the Last Year: Not on file  Transportation Needs:   . Lack of Transportation (Medical): Not on file  . Lack of Transportation (Non-Medical): Not on file  Physical Activity:   . Days of Exercise per Week: Not on file  . Minutes of Exercise per Session: Not on file  Stress:   . Feeling of Stress : Not on file  Social Connections:   . Frequency of Communication with Friends and Family: Not on file  . Frequency of Social Gatherings with Friends and Family: Not on file  . Attends Religious Services: Not on file  . Active Member of Clubs or Organizations: Not on file  . Attends Archivist Meetings: Not on file  . Marital Status: Not on file  Intimate Partner Violence:   . Fear of Current or Ex-Partner: Not on file  . Emotionally Abused: Not on file  . Physically Abused: Not on file  . Sexually Abused: Not on file      Review of Systems  All other systems reviewed and are negative.      Objective:   Physical Exam Constitutional:      General: She is not in acute distress.    Appearance: Normal appearance. She is not ill-appearing, toxic-appearing or diaphoretic.  Cardiovascular:     Rate and Rhythm: Normal rate. Rhythm irregular.     Pulses: Normal pulses.     Heart sounds: Normal heart sounds.  Pulmonary:     Effort: Pulmonary effort is normal. No respiratory distress.     Breath sounds: Normal breath sounds. No stridor. No wheezing, rhonchi or rales.  Chest:     Chest wall: No tenderness.  Abdominal:     General: Abdomen is flat. Bowel sounds are normal. There is no distension.     Palpations: Abdomen is soft. There is no mass.      Tenderness: There is no abdominal tenderness. There is no guarding or rebound.     Hernia: No hernia is present.  Musculoskeletal:        General: No swelling, tenderness, deformity or signs of injury. Normal range of motion.     Cervical back: Neck supple.     Right hip: No tenderness. Normal range of motion.     Left hip: No tenderness. Normal range of motion.     Right knee: Normal.     Left knee: Normal.     Right lower leg: No edema.     Left lower leg: No edema.     Right ankle: Normal.     Left ankle: Normal.     Right foot: Normal. No swelling or deformity.     Left foot: Normal. No swelling or deformity.  Lymphadenopathy:     Cervical: No cervical adenopathy.  Neurological:     General: No focal deficit present.     Mental Status: She is alert and oriented to person, place, and time.     Cranial Nerves: No cranial  nerve deficit.     Sensory: No sensory deficit.     Motor: No weakness.     Coordination: Coordination normal.     Deep Tendon Reflexes: Reflexes normal.           Assessment & Plan:  Weakness of both legs - Plan: Ambulatory referral to [facility], CBC with Differential, COMPLETE METABOLIC PANEL WITH GFR  Falls frequently  Urinary tract infection without hematuria, site unspecified  Confusion  I will arrange for physical therapy at home due to her weakness deconditioning and falling.  I will treat her current urinary tract infection with nitrofurantoin 100 mg p.o. twice daily for 2 weeks.  Repeat urinalysis and urine culture after.  I do not believe the patient is colonized because she had a negative urine culture in between the first infection and then second infection.  I have recommended the family arrange 24-hour a day 7-day a week surveillance.  Patient would likely need placement in the near future.  Daughter was instructed on how to pursue placement.  More than 25 minutes was spent with the patient and her daughter explaining the situation,  discussing treatment options, and arranging outpatient future care plans.

## 2019-03-31 LAB — COMPLETE METABOLIC PANEL WITH GFR
AG Ratio: 1.3 (calc) (ref 1.0–2.5)
ALT: 24 U/L (ref 6–29)
AST: 61 U/L — ABNORMAL HIGH (ref 10–35)
Albumin: 3.6 g/dL (ref 3.6–5.1)
Alkaline phosphatase (APISO): 49 U/L (ref 37–153)
BUN/Creatinine Ratio: 33 (calc) — ABNORMAL HIGH (ref 6–22)
BUN: 28 mg/dL — ABNORMAL HIGH (ref 7–25)
CO2: 28 mmol/L (ref 20–32)
Calcium: 9.2 mg/dL (ref 8.6–10.4)
Chloride: 99 mmol/L (ref 98–110)
Creat: 0.84 mg/dL (ref 0.60–0.88)
GFR, Est African American: 70 mL/min/{1.73_m2} (ref >=60)
GFR, Est Non African American: 61 mL/min/{1.73_m2} (ref >=60)
Globulin: 2.8 g/dL (calc) (ref 1.9–3.7)
Glucose, Bld: 58 mg/dL — ABNORMAL LOW (ref 65–99)
Potassium: 4.1 mmol/L (ref 3.5–5.3)
Sodium: 135 mmol/L (ref 135–146)
Total Bilirubin: 0.6 mg/dL (ref 0.2–1.2)
Total Protein: 6.4 g/dL (ref 6.1–8.1)

## 2019-03-31 LAB — CBC WITH DIFFERENTIAL/PLATELET
Absolute Monocytes: 835 cells/uL (ref 200–950)
Basophils Absolute: 23 cells/uL (ref 0–200)
Basophils Relative: 0.2 %
Eosinophils Absolute: 12 cells/uL — ABNORMAL LOW (ref 15–500)
Eosinophils Relative: 0.1 %
HCT: 36.5 % (ref 35.0–45.0)
Hemoglobin: 12.2 g/dL (ref 11.7–15.5)
Lymphs Abs: 1137 cells/uL (ref 850–3900)
MCH: 30.4 pg (ref 27.0–33.0)
MCHC: 33.4 g/dL (ref 32.0–36.0)
MCV: 91 fL (ref 80.0–100.0)
MPV: 11.2 fL (ref 7.5–12.5)
Monocytes Relative: 7.2 %
Neutro Abs: 9593 cells/uL — ABNORMAL HIGH (ref 1500–7800)
Neutrophils Relative %: 82.7 %
Platelets: 234 10*3/uL (ref 140–400)
RBC: 4.01 10*6/uL (ref 3.80–5.10)
RDW: 13.3 % (ref 11.0–15.0)
Total Lymphocyte: 9.8 %
WBC: 11.6 10*3/uL — ABNORMAL HIGH (ref 3.8–10.8)

## 2019-04-06 ENCOUNTER — Other Ambulatory Visit: Payer: Self-pay | Admitting: Family Medicine

## 2019-04-06 DIAGNOSIS — F0280 Dementia in other diseases classified elsewhere without behavioral disturbance: Secondary | ICD-10-CM

## 2019-04-07 DIAGNOSIS — Z85828 Personal history of other malignant neoplasm of skin: Secondary | ICD-10-CM | POA: Diagnosis not present

## 2019-04-07 DIAGNOSIS — M810 Age-related osteoporosis without current pathological fracture: Secondary | ICD-10-CM | POA: Diagnosis not present

## 2019-04-07 DIAGNOSIS — M47812 Spondylosis without myelopathy or radiculopathy, cervical region: Secondary | ICD-10-CM | POA: Diagnosis not present

## 2019-04-07 DIAGNOSIS — Z89421 Acquired absence of other right toe(s): Secondary | ICD-10-CM | POA: Diagnosis not present

## 2019-04-07 DIAGNOSIS — N390 Urinary tract infection, site not specified: Secondary | ICD-10-CM | POA: Diagnosis not present

## 2019-04-07 DIAGNOSIS — B9620 Unspecified Escherichia coli [E. coli] as the cause of diseases classified elsewhere: Secondary | ICD-10-CM | POA: Diagnosis not present

## 2019-04-07 DIAGNOSIS — H6122 Impacted cerumen, left ear: Secondary | ICD-10-CM | POA: Diagnosis not present

## 2019-04-07 DIAGNOSIS — Z9181 History of falling: Secondary | ICD-10-CM | POA: Diagnosis not present

## 2019-04-07 DIAGNOSIS — R531 Weakness: Secondary | ICD-10-CM | POA: Diagnosis not present

## 2019-04-07 DIAGNOSIS — Z7982 Long term (current) use of aspirin: Secondary | ICD-10-CM | POA: Diagnosis not present

## 2019-04-07 DIAGNOSIS — E785 Hyperlipidemia, unspecified: Secondary | ICD-10-CM | POA: Diagnosis not present

## 2019-04-07 DIAGNOSIS — I10 Essential (primary) hypertension: Secondary | ICD-10-CM | POA: Diagnosis not present

## 2019-04-15 ENCOUNTER — Ambulatory Visit: Payer: Medicare Other | Attending: Internal Medicine

## 2019-04-15 DIAGNOSIS — Z23 Encounter for immunization: Secondary | ICD-10-CM | POA: Insufficient documentation

## 2019-04-15 NOTE — Progress Notes (Signed)
   Covid-19 Vaccination Clinic  Name:  NIEMAH DEARMAN    MRN: [id number] DOB: [birth year]  04/15/2019  Ms. Alegria was observed post Covid-19 immunization for 15 minutes without incidence. She was provided with Vaccine Information Sheet and instruction to access the V-Safe system.   Ms. Scarfo was instructed to call 911 with any severe reactions post vaccine: Marland Kitchen Difficulty breathing  . Swelling of your face and throat  . A fast heartbeat  . A bad rash all over your body  . Dizziness and weakness    Immunizations Administered    Name Date Dose VIS Date Route   Pfizer COVID-19 Vaccine 04/15/2019 12:33 PM 0.3 mL 03/02/2019 Intramuscular   Manufacturer: [place]   Lot: [id number]   [place]: [id number]

## 2019-04-17 ENCOUNTER — Other Ambulatory Visit: Payer: Medicare Other

## 2019-04-17 ENCOUNTER — Other Ambulatory Visit: Payer: Self-pay

## 2019-04-17 ENCOUNTER — Other Ambulatory Visit: Payer: Self-pay | Admitting: Family Medicine

## 2019-04-17 DIAGNOSIS — R350 Frequency of micturition: Secondary | ICD-10-CM

## 2019-04-17 DIAGNOSIS — R7989 Other specified abnormal findings of blood chemistry: Secondary | ICD-10-CM

## 2019-04-17 LAB — URINALYSIS, ROUTINE W REFLEX MICROSCOPIC
Bilirubin Urine: NEGATIVE
Glucose, UA: NEGATIVE
Ketones, ur: NEGATIVE
Nitrite: POSITIVE — AB
Protein, ur: NEGATIVE
Specific Gravity, Urine: 1.02 (ref 1.001–1.03)
pH: 5.5 (ref 5.0–8.0)

## 2019-04-17 LAB — MICROSCOPIC MESSAGE

## 2019-04-20 LAB — URINE CULTURE
MICRO NUMBER:: 10081574
SPECIMEN QUALITY:: ADEQUATE

## 2019-04-25 ENCOUNTER — Other Ambulatory Visit: Payer: Self-pay

## 2019-04-25 ENCOUNTER — Ambulatory Visit (HOSPITAL_COMMUNITY)
Admission: RE | Admit: 2019-04-25 | Discharge: 2019-04-25 | Disposition: A | Discharge status: Home / Self Care | Payer: Medicare Other | Source: Ambulatory Visit | Attending: Family Medicine | Admitting: Family Medicine

## 2019-04-25 DIAGNOSIS — R945 Abnormal results of liver function studies: Secondary | ICD-10-CM | POA: Diagnosis not present

## 2019-04-25 DIAGNOSIS — R7989 Other specified abnormal findings of blood chemistry: Secondary | ICD-10-CM | POA: Insufficient documentation

## 2019-05-04 ENCOUNTER — Ambulatory Visit (INDEPENDENT_AMBULATORY_CARE_PROVIDER_SITE_OTHER): Payer: Medicare Other | Admitting: Podiatry

## 2019-05-04 ENCOUNTER — Ambulatory Visit: Payer: Medicare Other | Attending: Internal Medicine

## 2019-05-04 ENCOUNTER — Encounter: Payer: Self-pay | Admitting: Podiatry

## 2019-05-04 ENCOUNTER — Other Ambulatory Visit: Payer: Medicare Other

## 2019-05-04 ENCOUNTER — Other Ambulatory Visit: Payer: Self-pay

## 2019-05-04 DIAGNOSIS — B351 Tinea unguium: Secondary | ICD-10-CM

## 2019-05-04 DIAGNOSIS — Z111 Encounter for screening for respiratory tuberculosis: Secondary | ICD-10-CM | POA: Diagnosis not present

## 2019-05-04 DIAGNOSIS — M79674 Pain in right toe(s): Secondary | ICD-10-CM

## 2019-05-04 DIAGNOSIS — Z23 Encounter for immunization: Secondary | ICD-10-CM | POA: Insufficient documentation

## 2019-05-04 DIAGNOSIS — L84 Corns and callosities: Secondary | ICD-10-CM | POA: Diagnosis not present

## 2019-05-04 DIAGNOSIS — Z89421 Acquired absence of other right toe(s): Secondary | ICD-10-CM | POA: Diagnosis not present

## 2019-05-04 DIAGNOSIS — M79675 Pain in left toe(s): Secondary | ICD-10-CM

## 2019-05-04 DIAGNOSIS — L601 Onycholysis: Secondary | ICD-10-CM

## 2019-05-04 DIAGNOSIS — Z03818 Encounter for observation for suspected exposure to other biological agents ruled out: Secondary | ICD-10-CM | POA: Diagnosis not present

## 2019-05-04 NOTE — Progress Notes (Signed)
   Covid-19 Vaccination Clinic  Name:  Shirley Bates    MRN: [id number] DOB: October 20, 1925  05/04/2019  Shirley Bates was observed post Covid-19 immunization for 15 minutes without incidence. She was provided with Vaccine Information Sheet and instruction to access the V-Safe system.   Shirley Bates was instructed to call 911 with any severe reactions post vaccine: Marland Kitchen Difficulty breathing  . Swelling of your face and throat  . A fast heartbeat  . A bad rash all over your body  . Dizziness and weakness    Immunizations Administered    Name Date Dose VIS Date Route   Pfizer COVID-19 Vaccine 05/04/2019  9:08 AM 0.3 mL 03/02/2019 Intramuscular   Manufacturer: [place]   Lot: [id number]   [place]: [id number]

## 2019-05-04 NOTE — Patient Instructions (Signed)
Corns and Calluses Corns are small areas of thickened skin that occur on the top, sides, or tip of a toe. They contain a cone-shaped core with a point that can press on a nerve below. This causes pain.  Calluses are areas of thickened skin that can occur anywhere on the body, including the hands, fingers, palms, soles of the feet, and heels. Calluses are usually larger than corns. What are the causes? Corns and calluses are caused by rubbing (friction) or pressure, such as from shoes that are too tight or do not fit properly. What increases the risk? Corns are more likely to develop in people who have misshapen toes (toe deformities), such as hammer toes. Calluses can occur with friction to any area of the skin. They are more likely to develop in people who:  Work with their hands.  Wear shoes that fit poorly, are too tight, or are high-heeled.  Have toe deformities. What are the signs or symptoms? Symptoms of a corn or callus include:  A hard growth on the skin.  Pain or tenderness under the skin.  Redness and swelling.  Increased discomfort while wearing tight-fitting shoes, if your feet are affected. If a corn or callus becomes infected, symptoms may include:  Redness and swelling that gets worse.  Pain.  Fluid, blood, or pus draining from the corn or callus. How is this diagnosed? Corns and calluses may be diagnosed based on your symptoms, your medical history, and a physical exam. How is this treated? Treatment for corns and calluses may include:  Removing the cause of the friction or pressure. This may involve: ? Changing your shoes. ? Wearing shoe inserts (orthotics) or other protective layers in your shoes, such as a corn pad. ? Wearing gloves.  Applying medicine to the skin (topical medicine) to help soften skin in the hardened, thickened areas.  Removing layers of dead skin with a file to reduce the size of the corn or callus.  Removing the corn or callus with a  scalpel or laser.  Taking antibiotic medicines, if your corn or callus is infected.  Having surgery, if a toe deformity is the cause. Follow these instructions at home:   Take over-the-counter and prescription medicines only as told by your health care provider.  If you were prescribed an antibiotic, take it as told by your health care provider. Do not stop taking it even if your condition starts to improve.  Wear shoes that fit well. Avoid wearing high-heeled shoes and shoes that are too tight or too loose.  Wear any padding, protective layers, gloves, or orthotics as told by your health care provider.  Soak your hands or feet and then use a file or pumice stone to soften your corn or callus. Do this as told by your health care provider.  Check your corn or callus every day for symptoms of infection. Contact a health care provider if you:  Notice that your symptoms do not improve with treatment.  Have redness or swelling that gets worse.  Notice that your corn or callus becomes painful.  Have fluid, blood, or pus coming from your corn or callus.  Have new symptoms. Summary  Corns are small areas of thickened skin that occur on the top, sides, or tip of a toe.  Calluses are areas of thickened skin that can occur anywhere on the body, including the hands, fingers, palms, and soles of the feet. Calluses are usually larger than corns.  Corns and calluses are caused by   rubbing (friction) or pressure, such as from shoes that are too tight or do not fit properly.  Treatment may include wearing any padding, protective layers, gloves, or orthotics as told by your health care provider. This information is not intended to replace advice given to you by your health care provider. Make sure you discuss any questions you have with your health care provider. Document Revised: 06/28/2018 Document Reviewed: 01/19/2017 Elsevier Patient Education  2020 [employer]  Onychomycosis/Fungal  Toenails  WHAT IS IT? An infection that lies within the keratin of your nail plate that is caused by a fungus.  WHY ME? Fungal infections affect all ages, sexes, races, and creeds.  There may be many factors that predispose you to a fungal infection such as age, coexisting medical conditions such as diabetes, or an autoimmune disease; stress, medications, fatigue, genetics, etc.  Bottom line: fungus thrives in a warm, moist environment and your shoes offer such a location.  IS IT CONTAGIOUS? Theoretically, yes.  You do not want to share shoes, nail clippers or files with someone who has fungal toenails.  Walking around barefoot in the same room or sleeping in the same bed is unlikely to transfer the organism.  It is important to realize, however, that fungus can spread easily from one nail to the next on the same foot.  HOW DO WE TREAT THIS?  There are several ways to treat this condition.  Treatment may depend on many factors such as age, medications, pregnancy, liver and kidney conditions, etc.  It is best to ask your doctor which options are available to you.  4. No treatment.   Unlike many other medical concerns, you can live with this condition.  However for many people this can be a painful condition and may lead to ingrown toenails or a bacterial infection.  It is recommended that you keep the nails cut short to help reduce the amount of fungal nail. 5. Topical treatment.  These range from herbal remedies to prescription strength nail lacquers.  About 40-50% effective, topicals require twice daily application for approximately 9 to 12 months or until an entirely new nail has grown out.  The most effective topicals are medical grade medications available through physicians offices. 6. Oral antifungal medications.  With an 80-90% cure rate, the most common oral medication requires 3 to 4 months of therapy and stays in your system for a year as the new nail grows out.  Oral antifungal medications do  require blood work to make sure it is a safe drug for you.  A liver function panel will be performed prior to starting the medication and after the first month of treatment.  It is important to have the blood work performed to avoid any harmful side effects.  In general, this medication safe but blood work is required. 7. Laser Therapy.  This treatment is performed by applying a specialized laser to the affected nail plate.  This therapy is noninvasive, fast, and non-painful.  It is not covered by insurance and is therefore, out of pocket.  The results have been very good with a 80-95% cure rate.  The [facility] is the only practice in the area to offer this therapy. 8. Permanent Nail Avulsion.  Removing the entire nail so that a new nail will not grow back. 

## 2019-05-06 LAB — QUANTIFERON-TB GOLD PLUS
Mitogen-NIL: >10 IU/mL
NIL: 0.07 IU/mL
QuantiFERON-TB Gold Plus: NEGATIVE
TB1-NIL: <0 IU/mL
TB2-NIL: <0 IU/mL

## 2019-05-06 NOTE — Progress Notes (Signed)
Subjective: Shirley Bates presents today for follow up of painful mycotic nails b/l that are difficult to trim. Pain interferes with ambulation. Aggravating factors include wearing enclosed shoe gear. Pain is relieved with periodic professional debridement.   Daughter states right great toe toenail is loose and she feels it is coming off.   Allergies  Allergen Reactions  . Penicillins      Objective: There were no vitals filed for this visit.  Vascular Examination:  Capillary refill time to digits immediate b/l, palpable DP pulses b/l, palpable PT pulses b/l, pedal hair sparse b/l and skin temperature gradient within normal limits b/l  Dermatological Examination: Pedal skin with normal turgor, texture and tone bilaterally, no open wounds bilaterally, no interdigital macerations bilaterally, toenails 1-5 left, right great toe and 3-5 right elongated, dystrophic, thickened, crumbly with subungual debris and hyperkeratotic lesion(s) b/l 4th PIPJ and submet head 5 left foot.  No erythema, no edema, no drainage, no flocculence.  There is noted onchyolysis of entire nailplate of the right hallux.  The nailbed remains intact. There is no erythema, no edema, no drainage, no underlying flocculence.  Musculoskeletal: Normal muscle strength 5/5 to all lower extremity muscle groups bilaterally, no pain crepitus or joint limitation noted with ROM b/l and digital amputation right, 2nd toe  Neurological: Protective sensation intact 5/5 intact bilaterally with 10g monofilament b/l and vibratory sensation intact b/l  Assessment: 1. Pain due to onychomycosis of toenails of both feet   2. Corns and callosities   3. Onycholysis   4. Status post amputation of lesser toe of right foot ([place])    Plan: -Toenails 1-5 left, right great toe and 3-5 right were debrided in length and girth without iatrogenic bleeding. Loose nailplate right hallux removed from its remaining attachment to digit. Nailbed cleansed  with alcohol. Discontinue topical antifungal medication to right great toe. Continue medication on remaining toenails. Daughter related understanding. -Calluses were debrided without complication or incident. Total number debrided =3, b/l 4th digit and submet head 5 left foot. -Patient to continue soft, supportive shoe gear daily. -Patient to report any pedal injuries to medical professional immediately. -Patient/POA to call should there be question/concern in the interim.  Return in about 3 months (around 08/01/2019) for nail and callus trim.

## 2019-05-07 ENCOUNTER — Encounter: Payer: Self-pay | Admitting: Family Medicine

## 2019-05-07 DIAGNOSIS — Z9181 History of falling: Secondary | ICD-10-CM | POA: Diagnosis not present

## 2019-05-07 DIAGNOSIS — E785 Hyperlipidemia, unspecified: Secondary | ICD-10-CM | POA: Diagnosis not present

## 2019-05-07 DIAGNOSIS — Z89421 Acquired absence of other right toe(s): Secondary | ICD-10-CM | POA: Diagnosis not present

## 2019-05-07 DIAGNOSIS — I10 Essential (primary) hypertension: Secondary | ICD-10-CM | POA: Diagnosis not present

## 2019-05-07 DIAGNOSIS — Z85828 Personal history of other malignant neoplasm of skin: Secondary | ICD-10-CM | POA: Diagnosis not present

## 2019-05-07 DIAGNOSIS — R531 Weakness: Secondary | ICD-10-CM | POA: Diagnosis not present

## 2019-05-07 DIAGNOSIS — M47812 Spondylosis without myelopathy or radiculopathy, cervical region: Secondary | ICD-10-CM | POA: Diagnosis not present

## 2019-05-07 DIAGNOSIS — H6122 Impacted cerumen, left ear: Secondary | ICD-10-CM | POA: Diagnosis not present

## 2019-05-07 DIAGNOSIS — B9620 Unspecified Escherichia coli [E. coli] as the cause of diseases classified elsewhere: Secondary | ICD-10-CM | POA: Diagnosis not present

## 2019-05-07 DIAGNOSIS — N390 Urinary tract infection, site not specified: Secondary | ICD-10-CM | POA: Diagnosis not present

## 2019-05-07 DIAGNOSIS — M810 Age-related osteoporosis without current pathological fracture: Secondary | ICD-10-CM | POA: Diagnosis not present

## 2019-05-07 DIAGNOSIS — Z7982 Long term (current) use of aspirin: Secondary | ICD-10-CM | POA: Diagnosis not present

## 2019-05-14 ENCOUNTER — Other Ambulatory Visit: Payer: Self-pay | Admitting: Family Medicine

## 2019-05-14 ENCOUNTER — Telehealth: Payer: Self-pay

## 2019-05-14 MED ORDER — ALENDRONATE SODIUM 70 MG PO TABS: 70.0000 mg | ORAL_TABLET | ORAL | 3 refills | Status: DC

## 2019-05-14 NOTE — Telephone Encounter (Signed)
Pt's daughter called to report that pt has went into [facility] on 02/19. Pt is experiencing anxiety and daughter would like an Rx sent to pharmacy. Please advise.

## 2019-05-16 ENCOUNTER — Telehealth: Payer: Self-pay | Admitting: Family Medicine

## 2019-05-16 NOTE — Telephone Encounter (Signed)
Patient in nursing home and is having a lot of anxiety would like to know if something can be called in for this if possible  [phone]

## 2019-05-17 ENCOUNTER — Other Ambulatory Visit: Payer: Self-pay | Admitting: Family Medicine

## 2019-05-17 MED ORDER — LORAZEPAM 0.5 MG PO TABS: 0.5000 mg | ORAL_TABLET | Freq: Two times a day (BID) | ORAL | 1 refills | Status: DC | PRN

## 2019-05-17 NOTE — Telephone Encounter (Signed)
Could use ativan 0.5 mg poq 12 hrs prn anxiety (30) tabs.  Where do I need to send rx?

## 2019-05-17 NOTE — Telephone Encounter (Signed)
Send to [facility] and I will send order to [facility]

## 2019-06-08 ENCOUNTER — Telehealth: Payer: Self-pay | Admitting: *Deleted

## 2019-06-08 NOTE — Telephone Encounter (Signed)
Faxed hand written script for [employer] Antifungal suspension to [facility].

## 2019-06-08 NOTE — Telephone Encounter (Signed)
Pt's dtr, [name] states pt is now in assisted living and needs a rx sent to [facility] to apply antifungal topical daily to all toenails except right 1st toenail site.

## 2019-06-15 ENCOUNTER — Other Ambulatory Visit: Payer: Self-pay | Admitting: Family Medicine

## 2019-06-15 ENCOUNTER — Telehealth: Payer: Self-pay | Admitting: Family Medicine

## 2019-06-15 ENCOUNTER — Other Ambulatory Visit: Payer: Medicare Other

## 2019-06-15 ENCOUNTER — Other Ambulatory Visit: Payer: Self-pay

## 2019-06-15 DIAGNOSIS — R410 Disorientation, unspecified: Secondary | ICD-10-CM

## 2019-06-15 DIAGNOSIS — R4586 Emotional lability: Secondary | ICD-10-CM

## 2019-06-15 LAB — MICROSCOPIC MESSAGE

## 2019-06-15 LAB — URINALYSIS, ROUTINE W REFLEX MICROSCOPIC
Bilirubin Urine: NEGATIVE
Glucose, UA: NEGATIVE
Ketones, ur: NEGATIVE
Nitrite: POSITIVE — AB
Protein, ur: NEGATIVE
Specific Gravity, Urine: 1.015 (ref 1.001–1.03)
pH: 7.5 (ref 5.0–8.0)

## 2019-06-15 MED ORDER — SULFAMETHOXAZOLE-TRIMETHOPRIM 800-160 MG PO TABS: 1.0000 | ORAL_TABLET | Freq: Two times a day (BID) | ORAL | 0 refills | Status: AC

## 2019-06-15 NOTE — Telephone Encounter (Signed)
ok 

## 2019-06-15 NOTE — Telephone Encounter (Signed)
See lab note.  

## 2019-06-15 NOTE — Telephone Encounter (Signed)
Pt's daughter called and wanted to know if she could bring a sample by here as the nurse at [facility] states that she is having some alerted mood issues and her personality has changed. Informed daughter that she could bring in a urine and we will test it as we have no available openings today.

## 2019-06-18 ENCOUNTER — Other Ambulatory Visit: Payer: Self-pay | Admitting: Family Medicine

## 2019-06-18 LAB — URINE CULTURE
MICRO NUMBER:: 10296889
SPECIMEN QUALITY:: ADEQUATE

## 2019-06-18 MED ORDER — NITROFURANTOIN MONOHYD MACRO 100 MG PO CAPS: 100.0000 mg | ORAL_CAPSULE | Freq: Two times a day (BID) | ORAL | 0 refills | Status: DC

## 2019-06-21 ENCOUNTER — Other Ambulatory Visit: Payer: Self-pay | Admitting: Family Medicine

## 2019-06-29 ENCOUNTER — Other Ambulatory Visit: Payer: Medicare Other

## 2019-06-29 ENCOUNTER — Other Ambulatory Visit: Payer: Self-pay

## 2019-06-29 DIAGNOSIS — N390 Urinary tract infection, site not specified: Secondary | ICD-10-CM | POA: Diagnosis not present

## 2019-06-29 LAB — URINALYSIS, ROUTINE W REFLEX MICROSCOPIC
Bilirubin Urine: NEGATIVE
Glucose, UA: NEGATIVE
Ketones, ur: NEGATIVE
Nitrite: POSITIVE — AB
Protein, ur: NEGATIVE
Specific Gravity, Urine: 1.02 (ref 1.001–1.03)
pH: 7.5 (ref 5.0–8.0)

## 2019-06-29 LAB — MICROSCOPIC MESSAGE

## 2019-07-01 LAB — URINE CULTURE
MICRO NUMBER:: 10346263
SPECIMEN QUALITY:: ADEQUATE

## 2019-08-03 ENCOUNTER — Ambulatory Visit: Payer: Medicare Other | Admitting: Podiatry

## 2019-08-16 ENCOUNTER — Other Ambulatory Visit: Payer: Self-pay

## 2019-08-16 MED ORDER — LORAZEPAM 0.5 MG PO TABS: 0.5000 mg | ORAL_TABLET | Freq: Two times a day (BID) | ORAL | 1 refills | Status: DC | PRN

## 2019-08-16 NOTE — Telephone Encounter (Signed)
Elverda Pry called about her mother, Shirley Bates does not have a refill on her Lorazepam 5 mg, since 07/09/2019. Mickel Baas asked would you recommend something different or can it be refilled?  Last refill 05-17-19, #1 refill

## 2019-08-17 ENCOUNTER — Other Ambulatory Visit: Payer: Self-pay

## 2019-08-17 MED ORDER — LORAZEPAM 0.5 MG PO TABS: 0.5000 mg | ORAL_TABLET | Freq: Two times a day (BID) | ORAL | 1 refills | Status: DC | PRN

## 2019-08-17 NOTE — Telephone Encounter (Signed)
Requested Prescriptions   Pending Prescriptions Disp Refills  . LORazepam (ATIVAN) 0.5 MG tablet 30 tablet 1    Sig: Take 1 tablet (0.5 mg total) by mouth 2 (two) times daily as needed for anxiety.     Rx was sent to wrong pharmacy. Pt's Daughter Shirley Bates stated it was suppose to go to [facility] and not mail order.

## 2019-09-04 DIAGNOSIS — F0391 Unspecified dementia with behavioral disturbance: Secondary | ICD-10-CM | POA: Diagnosis not present

## 2019-09-04 DIAGNOSIS — M810 Age-related osteoporosis without current pathological fracture: Secondary | ICD-10-CM | POA: Diagnosis not present

## 2019-09-04 DIAGNOSIS — I10 Essential (primary) hypertension: Secondary | ICD-10-CM | POA: Diagnosis not present

## 2019-09-04 DIAGNOSIS — G301 Alzheimer's disease with late onset: Secondary | ICD-10-CM | POA: Diagnosis not present

## 2019-10-08 DIAGNOSIS — H9193 Unspecified hearing loss, bilateral: Secondary | ICD-10-CM | POA: Diagnosis not present

## 2019-10-08 DIAGNOSIS — R05 Cough: Secondary | ICD-10-CM | POA: Diagnosis not present

## 2019-10-08 DIAGNOSIS — G301 Alzheimer's disease with late onset: Secondary | ICD-10-CM | POA: Diagnosis not present

## 2019-10-08 DIAGNOSIS — I10 Essential (primary) hypertension: Secondary | ICD-10-CM | POA: Diagnosis not present

## 2019-10-12 ENCOUNTER — Encounter (HOSPITAL_COMMUNITY): Payer: Self-pay | Admitting: Emergency Medicine

## 2019-10-12 ENCOUNTER — Inpatient Hospital Stay (HOSPITAL_COMMUNITY)
Admission: EM | Admit: 2019-10-12 | Discharge: 2019-10-17 | DRG: 308 | Disposition: A | Discharge status: Skilled Nursing Facility | Payer: Medicare Other | Source: Skilled Nursing Facility | Attending: Internal Medicine | Admitting: Internal Medicine

## 2019-10-12 ENCOUNTER — Inpatient Hospital Stay (HOSPITAL_COMMUNITY): Payer: Medicare Other

## 2019-10-12 ENCOUNTER — Emergency Department (HOSPITAL_COMMUNITY): Payer: Medicare Other

## 2019-10-12 DIAGNOSIS — F0390 Unspecified dementia without behavioral disturbance: Secondary | ICD-10-CM | POA: Diagnosis present

## 2019-10-12 DIAGNOSIS — G4700 Insomnia, unspecified: Secondary | ICD-10-CM | POA: Diagnosis present

## 2019-10-12 DIAGNOSIS — I248 Other forms of acute ischemic heart disease: Secondary | ICD-10-CM | POA: Diagnosis present

## 2019-10-12 DIAGNOSIS — I110 Hypertensive heart disease with heart failure: Secondary | ICD-10-CM | POA: Diagnosis present

## 2019-10-12 DIAGNOSIS — Z87440 Personal history of urinary (tract) infections: Secondary | ICD-10-CM | POA: Diagnosis not present

## 2019-10-12 DIAGNOSIS — I420 Dilated cardiomyopathy: Secondary | ICD-10-CM | POA: Insufficient documentation

## 2019-10-12 DIAGNOSIS — M810 Age-related osteoporosis without current pathological fracture: Secondary | ICD-10-CM | POA: Diagnosis present

## 2019-10-12 DIAGNOSIS — I4891 Unspecified atrial fibrillation: Secondary | ICD-10-CM | POA: Diagnosis not present

## 2019-10-12 DIAGNOSIS — Z20822 Contact with and (suspected) exposure to covid-19: Secondary | ICD-10-CM | POA: Diagnosis present

## 2019-10-12 DIAGNOSIS — Z85828 Personal history of other malignant neoplasm of skin: Secondary | ICD-10-CM

## 2019-10-12 DIAGNOSIS — B9620 Unspecified Escherichia coli [E. coli] as the cause of diseases classified elsewhere: Secondary | ICD-10-CM | POA: Diagnosis present

## 2019-10-12 DIAGNOSIS — I5041 Acute combined systolic (congestive) and diastolic (congestive) heart failure: Secondary | ICD-10-CM | POA: Diagnosis not present

## 2019-10-12 DIAGNOSIS — I350 Nonrheumatic aortic (valve) stenosis: Secondary | ICD-10-CM | POA: Diagnosis not present

## 2019-10-12 DIAGNOSIS — I351 Nonrheumatic aortic (valve) insufficiency: Secondary | ICD-10-CM

## 2019-10-12 DIAGNOSIS — E8770 Fluid overload, unspecified: Secondary | ICD-10-CM | POA: Diagnosis present

## 2019-10-12 DIAGNOSIS — Z789 Other specified health status: Secondary | ICD-10-CM

## 2019-10-12 DIAGNOSIS — Z89021 Acquired absence of right finger(s): Secondary | ICD-10-CM

## 2019-10-12 DIAGNOSIS — R8271 Bacteriuria: Secondary | ICD-10-CM | POA: Diagnosis not present

## 2019-10-12 DIAGNOSIS — I10 Essential (primary) hypertension: Secondary | ICD-10-CM | POA: Diagnosis not present

## 2019-10-12 DIAGNOSIS — Z880 Allergy status to penicillin: Secondary | ICD-10-CM

## 2019-10-12 DIAGNOSIS — I361 Nonrheumatic tricuspid (valve) insufficiency: Secondary | ICD-10-CM | POA: Diagnosis not present

## 2019-10-12 DIAGNOSIS — H6122 Impacted cerumen, left ear: Secondary | ICD-10-CM

## 2019-10-12 DIAGNOSIS — D649 Anemia, unspecified: Secondary | ICD-10-CM | POA: Diagnosis not present

## 2019-10-12 DIAGNOSIS — I340 Nonrheumatic mitral (valve) insufficiency: Secondary | ICD-10-CM

## 2019-10-12 DIAGNOSIS — I959 Hypotension, unspecified: Secondary | ICD-10-CM | POA: Diagnosis present

## 2019-10-12 DIAGNOSIS — Z7983 Long term (current) use of bisphosphonates: Secondary | ICD-10-CM | POA: Diagnosis not present

## 2019-10-12 DIAGNOSIS — Z66 Do not resuscitate: Secondary | ICD-10-CM | POA: Diagnosis present

## 2019-10-12 DIAGNOSIS — I5043 Acute on chronic combined systolic (congestive) and diastolic (congestive) heart failure: Secondary | ICD-10-CM | POA: Diagnosis present

## 2019-10-12 DIAGNOSIS — Z79899 Other long term (current) drug therapy: Secondary | ICD-10-CM

## 2019-10-12 DIAGNOSIS — I480 Paroxysmal atrial fibrillation: Secondary | ICD-10-CM | POA: Diagnosis present

## 2019-10-12 DIAGNOSIS — Z7409 Other reduced mobility: Secondary | ICD-10-CM

## 2019-10-12 DIAGNOSIS — I501 Left ventricular failure: Secondary | ICD-10-CM

## 2019-10-12 DIAGNOSIS — E871 Hypo-osmolality and hyponatremia: Secondary | ICD-10-CM | POA: Diagnosis present

## 2019-10-12 DIAGNOSIS — N390 Urinary tract infection, site not specified: Secondary | ICD-10-CM | POA: Diagnosis present

## 2019-10-12 DIAGNOSIS — E782 Mixed hyperlipidemia: Secondary | ICD-10-CM | POA: Diagnosis not present

## 2019-10-12 DIAGNOSIS — E785 Hyperlipidemia, unspecified: Secondary | ICD-10-CM | POA: Diagnosis present

## 2019-10-12 DIAGNOSIS — I5021 Acute systolic (congestive) heart failure: Secondary | ICD-10-CM | POA: Diagnosis not present

## 2019-10-12 HISTORY — DX: Malignant (primary) neoplasm, unspecified: C80.1

## 2019-10-12 HISTORY — PX: TRANSTHORACIC ECHOCARDIOGRAM: SHX275

## 2019-10-12 HISTORY — DX: Unspecified atrial fibrillation: I48.91

## 2019-10-12 LAB — URINALYSIS, ROUTINE W REFLEX MICROSCOPIC
Bilirubin Urine: NEGATIVE
Glucose, UA: NEGATIVE mg/dL
Hgb urine dipstick: NEGATIVE
Ketones, ur: NEGATIVE mg/dL
Nitrite: NEGATIVE
Protein, ur: 30 mg/dL — AB
Specific Gravity, Urine: 1.021 (ref 1.005–1.030)
WBC, UA: >50 WBC/hpf — ABNORMAL HIGH (ref 0–5)
pH: 5 (ref 5.0–8.0)

## 2019-10-12 LAB — COMPREHENSIVE METABOLIC PANEL
ALT: 36 U/L (ref 0–44)
AST: 37 U/L (ref 15–41)
Albumin: 2.9 g/dL — ABNORMAL LOW (ref 3.5–5.0)
Alkaline Phosphatase: 75 U/L (ref 38–126)
Anion gap: 11 (ref 5–15)
BUN: 19 mg/dL (ref 8–23)
CO2: 21 mmol/L — ABNORMAL LOW (ref 22–32)
Calcium: 8.7 mg/dL — ABNORMAL LOW (ref 8.9–10.3)
Chloride: 102 mmol/L (ref 98–111)
Creatinine, Ser: 0.81 mg/dL (ref 0.44–1.00)
GFR calc Af Amer: >60 mL/min (ref >60)
GFR calc non Af Amer: >60 mL/min (ref >60)
Glucose, Bld: 109 mg/dL — ABNORMAL HIGH (ref 70–99)
Potassium: 4.6 mmol/L (ref 3.5–5.1)
Sodium: 134 mmol/L — ABNORMAL LOW (ref 135–145)
Total Bilirubin: 0.6 mg/dL (ref 0.3–1.2)
Total Protein: 6.4 g/dL — ABNORMAL LOW (ref 6.5–8.1)

## 2019-10-12 LAB — CBC
HCT: 32.2 % — ABNORMAL LOW (ref 36.0–46.0)
Hemoglobin: 10.6 g/dL — ABNORMAL LOW (ref 12.0–15.0)
MCH: 30.4 pg (ref 26.0–34.0)
MCHC: 32.9 g/dL (ref 30.0–36.0)
MCV: 92.3 fL (ref 80.0–100.0)
Platelets: 333 10*3/uL (ref 150–400)
RBC: 3.49 MIL/uL — ABNORMAL LOW (ref 3.87–5.11)
RDW: 15.5 % (ref 11.5–15.5)
WBC: 6.1 10*3/uL (ref 4.0–10.5)
nRBC: 0 % (ref 0.0–0.2)

## 2019-10-12 LAB — ECHOCARDIOGRAM COMPLETE
AR max vel: 1.65 cm2
AV Area VTI: 1.69 cm2
AV Area mean vel: 1.38 cm2
AV Mean grad: 3.4 mmHg
AV Peak grad: 6.2 mmHg
Ao pk vel: 1.24 m/s
MV M vel: 4.98 m/s
MV Peak grad: 99.2 mmHg
P 1/2 time: 507 msec
Radius: 0.4 cm
S' Lateral: 3.3 cm

## 2019-10-12 LAB — MAGNESIUM: Magnesium: 2 mg/dL (ref 1.7–2.4)

## 2019-10-12 LAB — TSH: TSH: 1.279 u[IU]/mL (ref 0.350–4.500)

## 2019-10-12 LAB — TROPONIN I (HIGH SENSITIVITY)
Troponin I (High Sensitivity): 20 ng/L — ABNORMAL HIGH (ref <18)
Troponin I (High Sensitivity): 22 ng/L — ABNORMAL HIGH (ref <18)

## 2019-10-12 LAB — IRON AND TIBC
Iron: 30 ug/dL (ref 28–170)
Saturation Ratios: 9 % — ABNORMAL LOW (ref 10.4–31.8)
TIBC: 330 ug/dL (ref 250–450)
UIBC: 300 ug/dL

## 2019-10-12 LAB — BRAIN NATRIURETIC PEPTIDE: B Natriuretic Peptide: 838.8 pg/mL — ABNORMAL HIGH (ref 0.0–100.0)

## 2019-10-12 LAB — FERRITIN: Ferritin: 47 ng/mL (ref 11–307)

## 2019-10-12 LAB — SARS CORONAVIRUS 2 BY RT PCR (HOSPITAL ORDER, PERFORMED IN ~~LOC~~ HOSPITAL LAB): SARS Coronavirus 2: NEGATIVE

## 2019-10-12 MED ORDER — METOPROLOL TARTRATE 25 MG PO TABS
25.0000 mg | ORAL_TABLET | Freq: Two times a day (BID) | ORAL | Status: DC
  Administered 2019-10-12 – 2019-10-16 (×8): 25 mg via ORAL
  Filled 2019-10-12 (×8): qty 1

## 2019-10-12 MED ORDER — DILTIAZEM HCL-DEXTROSE 125-5 MG/125ML-% IV SOLN (PREMIX)
5.0000 mg/h | INTRAVENOUS | Status: DC
  Administered 2019-10-12 (×2): 5 mg/h via INTRAVENOUS
  Filled 2019-10-12 (×3): qty 125

## 2019-10-12 MED ORDER — FUROSEMIDE 10 MG/ML IJ SOLN
40.0000 mg | Freq: Two times a day (BID) | INTRAMUSCULAR | Status: DC
  Administered 2019-10-12 – 2019-10-13 (×2): 40 mg via INTRAVENOUS
  Filled 2019-10-12 (×2): qty 4

## 2019-10-12 MED ORDER — ONDANSETRON HCL 4 MG/2ML IJ SOLN: 4.0000 mg | Freq: Four times a day (QID) | INTRAMUSCULAR | Status: DC | PRN

## 2019-10-12 MED ORDER — FUROSEMIDE 10 MG/ML IJ SOLN
20.0000 mg | Freq: Once | INTRAMUSCULAR | Status: AC
  Administered 2019-10-12: 20 mg via INTRAVENOUS
  Filled 2019-10-12: qty 2

## 2019-10-12 MED ORDER — LORAZEPAM 0.5 MG PO TABS: 0.5000 mg | ORAL_TABLET | Freq: Two times a day (BID) | ORAL | Status: DC | PRN

## 2019-10-12 MED ORDER — GUAIFENESIN-DM 100-10 MG/5ML PO SYRP
5.0000 mL | ORAL_SOLUTION | ORAL | Status: DC | PRN
  Administered 2019-10-14: 5 mL via ORAL
  Filled 2019-10-12: qty 5

## 2019-10-12 MED ORDER — IPRATROPIUM-ALBUTEROL 0.5-2.5 (3) MG/3ML IN SOLN: 3.0000 mL | RESPIRATORY_TRACT | Status: DC | PRN

## 2019-10-12 MED ORDER — DILTIAZEM LOAD VIA INFUSION
10.0000 mg | Freq: Once | INTRAVENOUS | Status: AC
  Administered 2019-10-12: 10 mg via INTRAVENOUS
  Filled 2019-10-12: qty 10

## 2019-10-12 MED ORDER — SIMVASTATIN 20 MG PO TABS
20.0000 mg | ORAL_TABLET | Freq: Every day | ORAL | Status: DC
  Administered 2019-10-12: 20 mg via ORAL
  Filled 2019-10-12: qty 1

## 2019-10-12 MED ORDER — ASPIRIN EC 81 MG PO TBEC
81.0000 mg | DELAYED_RELEASE_TABLET | Freq: Every day | ORAL | Status: DC
  Administered 2019-10-13 – 2019-10-15 (×3): 81 mg via ORAL
  Filled 2019-10-12 (×3): qty 1

## 2019-10-12 MED ORDER — APIXABAN 2.5 MG PO TABS
2.5000 mg | ORAL_TABLET | Freq: Two times a day (BID) | ORAL | Status: DC
  Administered 2019-10-12 – 2019-10-17 (×10): 2.5 mg via ORAL
  Filled 2019-10-12 (×12): qty 1

## 2019-10-12 MED ORDER — DILTIAZEM HCL-DEXTROSE 125-5 MG/125ML-% IV SOLN (PREMIX)
5.0000 mg/h | INTRAVENOUS | Status: DC
  Administered 2019-10-12: 5 mg/h via INTRAVENOUS
  Filled 2019-10-12 (×2): qty 125

## 2019-10-12 MED ORDER — LOSARTAN POTASSIUM 50 MG PO TABS: 100.0000 mg | ORAL_TABLET | Freq: Every day | ORAL | Status: DC

## 2019-10-12 MED ORDER — DILTIAZEM LOAD VIA INFUSION
10.0000 mg | Freq: Once | INTRAVENOUS | Status: DC
  Filled 2019-10-12: qty 10

## 2019-10-12 MED ORDER — ACETAMINOPHEN 325 MG PO TABS: 650.0000 mg | ORAL_TABLET | ORAL | Status: DC | PRN

## 2019-10-12 NOTE — ED Triage Notes (Signed)
Patient brought in by [facility] from [facility] for afib with RVR, rate around 170. EMS states they were originally called because the patient was gasping for air. EMS reports patient was resting peacefully when they arrived but tachycardic. Patient alert and oriented at this time.

## 2019-10-12 NOTE — Consult Note (Addendum)
Cardiology Consultation:   Patient ID: Shirley Bates MRN: [id number]; DOB: 1925/08/25  Admit date: 10/12/2019 Date of Consult: 10/12/2019  Primary Care Provider: Susy Frizzle, MD [facility] Cardiologist: New to [facility] (Dr. Ellyn Hack) [place] Electrophysiologist:  None   Patient Profile:   Shirley Bates is a 86 y.o. female with a history of dementia, hypertension, hyperlipidemia, osteoporosis, and recurrent UTI who is being seen today for the evaluation of atrial fibrillation with RVR at the request of Dr. Doristine Bosworth.  History of Present Illness:   [name] is a 86 year old female with the above history. Patient has some dementia but grandson, who works for [employer], was at bedside to help with history. She has no known CAD, CHF, or atrial fibrillation. She has never had any problems with her heart and for 86 years old has been very healthy. She has never smoked and there is no known family history of heart disease.   Patient very independent and was living on her own until earlier this year (January or February) when she moved into [facility]. Per grandson, she was in her usual state of health until 2-3 days ago we she started having a congested cough. No fevers, chills, or nasal congestion. Then this morning, EMS was called because patient was reportedly gasping for air. When EMS arrived, patient was resting comfortably but she was tachycardic and was found to be in atrial fibrillation with rates reportedly as high as the 170's. Patient denies any chest pain or shortness of breath; however, it does sound like she has had a little orthopnea. No PND. She has had some mild lower extremity edema for a while. No palpitations or awareness of arrhythmia. No dizziness, lightheadedness, or syncope. No abnormal bleeding in urine or stools. No dysuria or increased urinary frequency suggestive of UTI.  In the ED, patient tachycardic and mildly hypertensive. EKG showed atrial  fibrillation, rate 131 bpm, with non-specific ST/T changes. Initial troponin 20. Chest x-ray showed cardiomegaly with mild pulmonary edema and probable small bilateral pleural effusion consistent with CHF. BNP pending. WBC 6.1, Hgb 10.6, Plts 333. Na 134, K 4.6, Glucose 109, BUN 19, Cr 0.81, Albumin 2.9. LFTs normal. TSH normal. COVID-19 testing pending. Patient placed on IV Cardizem and admitted.  At the time of this evaluation, patient resting comfortably. Echo being performed. Rates ranging from 100's to 130's.   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 Authorizing Provider  acetaminophen (TYLENOL) 500 MG tablet Take 500 mg by mouth every 6 (six) hours as needed for mild pain.   Yes [provider]  alendronate (FOSAMAX) 70 MG tablet Take 1 tablet (70 mg total) by mouth every 7 (seven) days. Take with a full glass of water on an empty stomach. 05/14/19  Yes Shirley Frizzle, MD  aluminum-magnesium hydroxide-simethicone (MAALOX) 200-200-20 MG/5ML SUSP Take 30 mLs by mouth every 6 (six) hours as needed (indigestion).   Yes [provider]  aspirin 81 MG tablet Take 81 mg by mouth daily.   Yes [provider]  guaifenesin (ROBITUSSIN) 100 MG/5ML syrup Take 200 mg by mouth every 6 (six) hours  as needed for cough.   Yes [provider]  loperamide (IMODIUM) 2 MG capsule Take 2 mg by mouth as needed for diarrhea or loose stools (total 8 doses in 24 HR).   Yes [provider]  LORazepam (ATIVAN) 0.5 MG tablet Take 1 tablet (0.5 mg total) by mouth 2 (two) times daily as needed for anxiety. 08/17/19  Yes Onida, Modena Nunnery, MD  losartan (COZAAR) 100 MG tablet TAKE 1 TABLET BY MOUTH  EVERY DAY. 06/21/19  Yes Shirley Frizzle, MD  magnesium hydroxide (MILK OF MAGNESIA) 400 MG/5ML suspension Take 30 mLs by mouth at bedtime as needed for mild constipation.   Yes [provider]  multivitamin-iron-minerals-folic acid (CENTRUM) chewable tablet Chew 1 tablet by mouth daily.   Yes [provider]  neomycin-bacitracin-polymyxin (NEOSPORIN) ointment Apply 1 application topically as needed for wound care.   Yes [provider]  Omega-3 Fatty Acids (FISH OIL) 1200 MG CAPS Take 1,200 mg by mouth every morning.    Yes [provider]  simvastatin (ZOCOR) 20 MG tablet TAKE 1 TABLET BY MOUTH AT BEDTIME 02/07/19  Yes Shirley Frizzle, MD  tetrahydrozoline 0.05 % ophthalmic solution Place 1 drop into both eyes daily.   Yes [provider]  nitrofurantoin, macrocrystal-monohydrate, (MACROBID) 100 MG capsule Take 1 capsule (100 mg total) by mouth 2 (two) times daily. Patient not taking: Reported on 10/12/2019 06/18/19   Shirley Frizzle, MD  NONFORMULARY OR COMPOUNDED ITEM Antifungal solution: Terbinafine 3%, Fluconazole 2%, Tea Tree Oil 5%, Urea 10%, Ibuprofen 2% in DMSO suspension #13mL Patient not taking: Reported on 10/12/2019 02/02/19   Marzetta Board, DPM    Inpatient Medications: Scheduled Meds:  apixaban  2.5 mg Oral BID   furosemide  20 mg Intravenous Once   Continuous Infusions:  diltiazem (CARDIZEM) infusion     PRN Meds: acetaminophen, guaiFENesin-dextromethorphan, ipratropium-albuterol, ondansetron (ZOFRAN) IV  Allergies:    Allergies  Allergen Reactions   Penicillins     Social History:   Social History   Socioeconomic History   Marital status: Divorced    Spouse name: Not on file   Number of children: Not on file   Years of education: Not on file   Highest education level: Not on file  Occupational History   Not on file  Tobacco Use   Smoking status: Never Smoker   Smokeless tobacco: Never Used    Substance and Sexual Activity   Alcohol use: No   Drug use: No   Sexual activity: Never  Other Topics Concern   Not on file  Social History Narrative   Not on file   Social Determinants of Health   Financial Resource Strain:    Difficulty of Paying Living Expenses:   Food Insecurity:    Worried About Charity fundraiser in the Last Year:    Arboriculturist in the Last Year:   Transportation Needs:    Film/video editor (Medical):    Lack of Transportation (Non-Medical):   Physical Activity:    Days of Exercise per Week:    Minutes of Exercise per Session:   Stress:    Feeling of Stress :   Social Connections:    Frequency of Communication with Friends and Family:    Frequency of Social Gatherings with Friends and Family:    Attends Religious Services:    Active Member of Clubs or Organizations:    Attends Archivist Meetings:    Marital Status:  Intimate Partner Violence:    Fear of Current or Ex-Partner:    Emotionally Abused:    Physically Abused:    Sexually Abused:     Family History:    Family History  Problem Relation Age of Onset   Heart disease Father    Heart disease Sister    Heart disease Brother    Hypertension Brother    Breast cancer Neg Hx      ROS:  Please see the history of present illness. ROS limited some by patient's dementia.  Physical Exam/Data:   Vitals:   10/12/19 1256 10/12/19 1300 10/12/19 1415  BP: (!) 146/96 (!) 145/96 (!) 114/101  Pulse: (!) 131 (!) 124   Resp: 16 23 21   Temp: 98 F (36.7 C)    TempSrc: Oral    SpO2: 95% 95% 90%   No intake or output data in the 24 hours ending 10/12/19 1527 Last 3 Weights 03/19/2019 01/26/2019 01/12/2019  Weight (lbs) 112 lb 115 lb 118 lb  Weight (kg) 50.803 kg 52.164 kg 53.524 kg     There is no height or weight on file to calculate BMI.  General: 86 y.o. female resting comfortably in no acute distress. HEENT: Normocephalic and atraumatic.  Sclera clear.  Neck: Supple. No carotid bruits. No JVD. Heart: Tachycardic with irregularly irregular rhythm. Distinct S1 and S2. No murmurs, gallops, or rubs. Radial and distal pedal pulses 2+ and equal bilaterally. Lungs: No increased work of breathing. Mildly decreased breath sound and mild bibasilar crackles. No wheezes or rhonchi. Abdomen: Soft, non-distended, and non-tender to palpation. Bowel sounds present. MSK: Normal strength and tone for age. Extremities: 1+ pitting edema of bilateral lower extremities.    Skin: Warm and dry. Neuro: Patient has some underlying dementia. No focal deficits. Psych: Normal affect. Responds appropriately.   EKG:  The EKG was personally reviewed and demonstrates:  Atrial fibrillation, rate 131 bpm, with non-specific ST/T changes. Prolonged QTc of 538 ms.   Telemetry:  Telemetry was personally reviewed and demonstrates:  Atrial fibrillation, rate in the 100's to 130's. Occasional PVCs.  Relevant CV Studies: Echo pending.  Laboratory Data:  High Sensitivity Troponin:   Recent Labs  Lab 10/12/19 1305  TROPONINIHS 20*     Chemistry Recent Labs  Lab 10/12/19 1305  NA 134*  K 4.6  CL 102  CO2 21*  GLUCOSE 109*  BUN 19  CREATININE 0.81  CALCIUM 8.7*  GFRNONAA >60  GFRAA >60  ANIONGAP 11    Recent Labs  Lab 10/12/19 1305  PROT 6.4*  ALBUMIN 2.9*  AST 37  ALT 36  ALKPHOS 75  BILITOT 0.6   Hematology Recent Labs  Lab 10/12/19 1305  WBC 6.1  RBC 3.49*  HGB 10.6*  HCT 32.2*  MCV 92.3  MCH 30.4  MCHC 32.9  RDW 15.5  PLT 333   BNPNo results for input(s): BNP, PROBNP in the last 168 hours.  DDimer No results for input(s): DDIMER in the last 168 hours.   Radiology/Studies:  DG Chest Port 1 View  Result Date: 10/12/2019 CLINICAL DATA:  Shortness of breath. Atrial fibrillation with rapid ventricular response. EXAM: PORTABLE CHEST 1 VIEW COMPARISON:  None. FINDINGS: 1321 hours. The heart is enlarged. There is aortic  atherosclerosis and mild pulmonary edema. Right-greater-than-left basilar airspace opacities likely represent atelectasis. There are probable small bilateral pleural effusions. No pneumothorax. No acute osseous findings are evident. There are degenerative changes in the spine associated with a convex right thoracic scoliosis. Old  fracture of the distal right clavicle noted. Telemetry leads overlie the chest. IMPRESSION: Cardiomegaly with mild pulmonary edema and probable small bilateral pleural effusions consistent with congestive heart failure. Electronically Signed   By: Richardean Sale M.D.   On: 10/12/2019 13:43    Assessment and Plan:   New Onset Atrial Fibrillation with RVR  - Patient presented with cough for a few days and some shortness of breath and found to be in atrial fibrillation with RVR.  - Currently on IV Cardizem 15mg /hr but rates still in the 110's to 130's. - Echo pending. - Potassium 4.6. Goal >4.0. - Magnesium 2.0. Goal >2.0.  - TSH normal. - Continue IV Cardizem. Will give another bolus of IV Cardizem 10mg .  - Will start Lopressor 25mg  twice daily.  - If unable to rate control overnight, we can add Amiodarone.  - CHA2DS2-VASc = 4-5 (likely CHF, HTN, age x2, female). Eliquis 2.5mg  twice already ordered.   New Onset CHF -> (suspect that this is acute combined systolic and diastolic based on preliminary read on echo) - Chest x-ray showed cardiomegaly with mild pulmonary edema and probable small bilateral pleural effusion consistent with CHF. - BNP elevated in the 838.  - Echo pending.  - Patient given 60mg  of IV Lasix in the ED. No documented urinary output in the ED. - Patient is Lasix naive and is frail, so will plan for another dose of IV Lasix 40mg  and then re-evaluate.  - Continue home Losartan 100mg  daily. - Will add Lopressor as above.  - If EF low, will ultimately need to start Cardizem.  - Monitor daily weights, strict I/O's, and renal function.   Demand  Ischemia - High-sensitivity troponin minimally elevated at 20 >> 22. Not consistent with ACS. - EKG with non-specific ST/T changes. - Patient denies any chest pain. - Suspect demand ischemia in setting of atrial fibrillation and CHF. Do not anticipate any additional ischemic evaluation given age but Echo pending.  Hypertension - BP elevated.  - Continue IV Cardizem.  - Continue home Losartan 100mg  daily.  - Will add Lopressor as above.  Hyperlipidemia - Continue home Simvastatin.   Otherwise, per primary team.    For questions or updates, please contact Shirley Bates Please consult [email or web address] for contact info under    Signed, Shirley Mclean, PA-C  10/12/2019 3:27 PM   ATTENDING ATTESTATION  I have seen, examined and evaluated the patient this evening along with Shirley Rives, PA.  After reviewing all the available data and chart, we discussed the patients laboratory, study & physical findings as well as symptoms in detail. I agree with her findings, examination as well as impression recommendations as per our discussion.    Shirley Bates presents with what looks like acute probably combined heart failure exacerbated by A. fib RVR.  Most likely her heart rate was triggered by going into A. fib.  Unfortunately her echocardiogram was done when her heart rate was in the 130s in A. fib which would make it very difficult to read and likely underestimates her EF.  We do need to however pay attention to her EF since she is currently on diltiazem which would be unfavorable for low EF.  Currently she is laying in bed barely sitting up with no dyspnea.  Heart rates are ranging from the low 100s to 120s if she gets excited.  She does have mild 1-2+ lower extremity edema and may be mild basal rales on exam but has cannon A waves jugular veins.  Agree with the plan for rate control initially.  She is already started on Eliquis.  Judicious IV diuresis -> has already received 40 mg of IV Lasix  and has been had more than 1 L out already.  For now continue to use IV diltiazem for rate control.  When titrating up doses on the drip, each interval should be done with a bolus, we will therefore write for 10 mg IV bolus to go with the most recent titration. Agree with adding oral beta-blocker as this would probably be a better option long-term if EF is down.  She is tolerating heart rates in the 130s any chest pain which at her age would be a negative stress test -> making ischemic etiology less likely.     Shirley Bates, M.D., M.S. Interventional Cardiologist   Pager # [phone] Phone # [phone] [address] [place], [place] [address]

## 2019-10-12 NOTE — ED Provider Notes (Signed)
[facility] Provider Note   CSN: [id number] Arrival date & time: 10/12/19  1247     History Chief Complaint  Patient presents with  . Atrial Fibrillation    Shirley Bates is a 86 y.o. female.  Patient with hx htn, presents from [facility] via EMS with general weakness, and increased wob. From report and patient unclear when symptoms began. Pt currently indicates feels fine. Pt limited historian, dementia, level 5 caveat. EMS found patient to be in afib with rvr, hr 170s. They have ns bolus and cardizem with improvement in rate. Pt denies sense of palpitations. No chest pain.   The history is provided by the patient and the EMS personnel. The history is limited by the condition of the patient.  Atrial Fibrillation Pertinent negatives include no chest pain, no abdominal pain, no headaches and no shortness of breath.       Past Medical History:  Diagnosis Date  . Basal cell carcinoma of leg    left 2014  . Hyperlipidemia   . Hypertension     Patient Active Problem List   Diagnosis Date Noted  . Osteoporosis 05/28/2015  . Left ear impacted cerumen 12/12/2012  . OA (osteoarthritis) of neck 12/12/2012  . Hyperlipidemia   . Hypertension     Past Surgical History:  Procedure Laterality Date  . 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     Family History  Problem Relation Age of Onset  . Heart disease Father   . Heart disease Sister   . Heart disease Brother   . Hypertension Brother   . Breast cancer Neg Hx     Social History   Tobacco Use  . Smoking status: Never Smoker  . Smokeless tobacco: Never Used  Substance Use Topics  . Alcohol use: No  . Drug use: No    Home Medications Prior to Admission medications   Medication Sig Start Date End Date Taking? Authorizing Provider  alendronate (FOSAMAX) 70 MG tablet Take 1 tablet (70 mg total) by mouth every 7 (seven) days. Take  with a full glass of water on an empty stomach. 05/14/19   Susy Frizzle, MD  aspirin 81 MG tablet Take 81 mg by mouth daily.    [provider]  LORazepam (ATIVAN) 0.5 MG tablet Take 1 tablet (0.5 mg total) by mouth 2 (two) times daily as needed for anxiety. 08/17/19   [place], Modena Nunnery, MD  losartan (COZAAR) 100 MG tablet TAKE 1 TABLET BY MOUTH EVERY DAY. 06/21/19   Susy Frizzle, MD  multivitamin-iron-minerals-folic acid (CENTRUM) chewable tablet Chew 1 tablet by mouth daily.    [provider]  nitrofurantoin, macrocrystal-monohydrate, (MACROBID) 100 MG capsule Take 1 capsule (100 mg total) by mouth 2 (two) times daily. 06/18/19   Susy Frizzle, MD  NONFORMULARY OR COMPOUNDED ITEM Antifungal solution: Terbinafine 3%, Fluconazole 2%, Tea Tree Oil 5%, Urea 10%, Ibuprofen 2% in DMSO suspension #1mL 02/02/19   [place], Stephani Police, DPM  Omega-3 Fatty Acids (FISH OIL) 1200 MG CAPS Take 1,200 mg by mouth every morning.     [provider]  simvastatin (ZOCOR) 20 MG tablet TAKE 1 TABLET BY MOUTH AT BEDTIME 02/07/19   Susy Frizzle, MD    Allergies    Penicillins  Review of Systems   Review of Systems  Unable to perform ROS: Dementia  Constitutional: Negative for  chills and fever.  HENT: Negative for sore throat.   Eyes: Negative for redness.  Respiratory: Positive for cough. Negative for shortness of breath.   Cardiovascular: Negative for chest pain, palpitations and leg swelling.  Gastrointestinal: Negative for abdominal pain, diarrhea and vomiting.  Genitourinary: Negative for flank pain.  Musculoskeletal: Negative for back pain and neck pain.  Skin: Negative for rash.  Neurological: Negative for headaches.  Hematological: Does not bruise/bleed easily.  Psychiatric/Behavioral: Negative for agitation.    Physical Exam Updated Vital Signs BP (!) 146/96   Pulse (!) 131   Temp 98 F (36.7 C) (Oral)   Resp 16   SpO2 95%   Physical Exam Vitals  and nursing note reviewed.  Constitutional:      Appearance: Normal appearance. She is well-developed.  HENT:     Head: Atraumatic.     Nose: Nose normal.     Mouth/Throat:     Mouth: Mucous membranes are moist.  Eyes:     General: No scleral icterus.    Conjunctiva/sclera: Conjunctivae normal.     Pupils: Pupils are equal, round, and reactive to light.  Neck:     Trachea: No tracheal deviation.     Comments: Thyroid not grossly enlarged or tender. Cardiovascular:     Rate and Rhythm: Tachycardia present. Rhythm irregular.     Pulses: Normal pulses.     Heart sounds: Normal heart sounds. No murmur heard.  No friction rub. No gallop.   Pulmonary:     Effort: Pulmonary effort is normal. No respiratory distress.     Breath sounds: Normal breath sounds.  Abdominal:     General: Bowel sounds are normal. There is no distension.     Palpations: Abdomen is soft. There is no mass.     Tenderness: There is no abdominal tenderness. There is no guarding or rebound.     Hernia: No hernia is present.  Genitourinary:    Comments: No cva tenderness.  Musculoskeletal:        General: No swelling or tenderness.     Cervical back: Normal range of motion and neck supple. No rigidity. No muscular tenderness.  Skin:    General: Skin is warm and dry.     Findings: No rash.  Neurological:     Mental Status: She is alert.     Comments: Alert, speech normal. Motor/sens grossly intact bil  Psychiatric:        Mood and Affect: Mood normal.     ED Results / Procedures / Treatments   Labs (all labs ordered are listed, but only abnormal results are displayed) Results for orders placed or performed during the hospital encounter of 10/12/19  CBC  Result Value Ref Range   WBC 6.1 4.0 - 10.5 K/uL   RBC 3.49 (L) 3.87 - 5.11 MIL/uL   Hemoglobin 10.6 (L) 12.0 - 15.0 g/dL   HCT 32.2 (L) 36 - 46 %   MCV 92.3 80.0 - 100.0 fL   MCH 30.4 26.0 - 34.0 pg   MCHC 32.9 30.0 - 36.0 g/dL   RDW 15.5 11.5 -  15.5 %   Platelets 333 150 - 400 K/uL   nRBC 0.0 0.0 - 0.2 %  Comprehensive metabolic panel  Result Value Ref Range   Sodium 134 (L) 135 - 145 mmol/L   Potassium 4.6 3.5 - 5.1 mmol/L   Chloride 102 98 - 111 mmol/L   CO2 21 (L) 22 - 32 mmol/L   Glucose, Bld 109 (H)  70 - 99 mg/dL   BUN 19 8 - 23 mg/dL   Creatinine, Ser 0.81 0.44 - 1.00 mg/dL   Calcium 8.7 (L) 8.9 - 10.3 mg/dL   Total Protein 6.4 (L) 6.5 - 8.1 g/dL   Albumin 2.9 (L) 3.5 - 5.0 g/dL   AST 37 15 - 41 U/L   ALT 36 0 - 44 U/L   Alkaline Phosphatase 75 38 - 126 U/L   Total Bilirubin 0.6 0.3 - 1.2 mg/dL   GFR calc non Af Amer >60 >60 mL/min   GFR calc Af Amer >60 >60 mL/min   Anion gap 11 5 - 15  TSH  Result Value Ref Range   TSH 1.279 0.350 - 4.500 uIU/mL  Troponin I (High Sensitivity)  Result Value Ref Range   Troponin I (High Sensitivity) 20 (H) <18 ng/L    EKG EKG Interpretation  Date/Time:  Friday October 12 2019 12:56:14 EDT Ventricular Rate:  131 PR Interval:    QRS Duration: 78 QT Interval:  364 QTC Calculation: 538 R Axis:   8 Text Interpretation: Atrial fibrillation with rapid ventricular response QT prolonged Non-specific ST-t changes Confirmed by Lajean Saver [phone]) on 10/12/2019 1:02:14 PM   Radiology DG Chest Port 1 View  Result Date: 10/12/2019 CLINICAL DATA:  Shortness of breath. Atrial fibrillation with rapid ventricular response. EXAM: PORTABLE CHEST 1 VIEW COMPARISON:  None. FINDINGS: 1321 hours. The heart is enlarged. There is aortic atherosclerosis and mild pulmonary edema. Right-greater-than-left basilar airspace opacities likely represent atelectasis. There are probable small bilateral pleural effusions. No pneumothorax. No acute osseous findings are evident. There are degenerative changes in the spine associated with a convex right thoracic scoliosis. Old fracture of the distal right clavicle noted. Telemetry leads overlie the chest. IMPRESSION: Cardiomegaly with mild pulmonary edema and  probable small bilateral pleural effusions consistent with congestive heart failure. Electronically Signed   By: Richardean Sale M.D.   On: 10/12/2019 13:43    Procedures Procedures (including critical care time)  Medications Ordered in ED Medications - No data to display  ED Course  I have reviewed the triage vital signs and the nursing notes.  Pertinent labs & imaging results that were available during my care of the patient were reviewed by me and considered in my medical decision making (see chart for details).    MDM Rules/Calculators/A&P                          Iv ns. Continuous pulse ox and monitor. Stat labs. Ecg. Pcxr.   Reviewed nursing notes and prior charts for additional history.   MDM Number of Diagnoses or Management Options   Amount and/or Complexity of Data Reviewed Clinical lab tests: ordered and reviewed Tests in the radiology section of CPT: ordered and reviewed Tests in the medicine section of CPT: ordered and reviewed Discussion of test results with the performing providers: yes Decide to obtain previous medical records or to obtain history from someone other than the patient: yes Obtain history from someone other than the patient: yes Review and summarize past medical records: yes Discuss the patient with other providers: yes Independent visualization of images, tracings, or specimens: yes  Risk of Complications, Morbidity, and/or Mortality Presenting problems: high Diagnostic procedures: high Management options: high   Hr currently 140, cardizem iv bolus and gtt.  This patients CHA2DS2-VASc Score and unadjusted Ischemic Stroke Rate (% per year) is equal to 4.8 % stroke rate/year from a score  of 4  Above score calculated as 1 point each if present [CHF, HTN, DM, Vascular=MI/PAD/Aortic Plaque, Age if 65-74, or Female] Above score calculated as 2 points each if present [Age > 75, or Stroke/TIA/TE]  Initial labs reviewed/interpreted by me - wbc  normal.  Recheck pt, hr improved on cardizem gtt.   Medicine service consulted for admission.   Additional labs reviewed/interpreted by me - trop 20, sl elev.   CXR reviewed/interpreted by me - vascular congestion,chf. bnp added to labs. Lasix iv.   HR improved w meds, afib.   Hospitalists consulted for admission.   CRITICAL CARE RE: afib with rapid ventricular response and chf/edema requiring iv cardizem bolus/gtt/rate control.  Performed by: Mirna Mires Total critical care time: 35 minutes Critical care time was exclusive of separately billable procedures and treating other patients. Critical care was necessary to treat or prevent imminent or life-threatening deterioration. Critical care was time spent personally by me on the following activities: development of treatment plan with patient and/or surrogate as well as nursing, discussions with consultants, evaluation of patient's response to treatment, examination of patient, obtaining history from patient or surrogate, ordering and performing treatments and interventions, ordering and review of laboratory studies, ordering and review of radiographic studies, pulse oximetry and re-evaluation of patient's condition.  As admission, will add covid swab.    Final Clinical Impression(s) / ED Diagnoses Final diagnoses:  None    Rx / DC Orders ED Discharge Orders    None       Lajean Saver, MD 10/12/19 1429

## 2019-10-12 NOTE — H&P (Signed)
History and Physical    Shirley Bates:[id number] DOB: April 09, 1925 DOA: 10/12/2019  PCP: Susy Frizzle, MD  Patient coming from: [place] have personally briefly reviewed patient's old medical records in [place]  Chief Complaint: Cough and shortness of breath  HPI: Shirley Bates is a 86 y.o. female with medical history significant of dementia, hypertension, hyperlipidemia, osteoporosis, recurrent UTI presents to emergency department with cough since 2 to 3 days and shortness of breath this morning.  Patient is poor historian due to dementia.  Patient's grandson at bedside is the historian.  He tells me that patient was complaining of cough and cold and chest congestion since 2 to 3 days and this morning staff noted that she was gasping for air therefore she brought to the ER for further evaluation and management.  Upon arrival to EM- patient was found to be in A. fib with RVR with heart rate in 170s.  She received normal saline bolus and Cardizem with improvement in rate.  Patient has bilateral leg swelling however she denies symptoms of orthopnea, PND, chest pain, palpitation, fever, chills, sore throat, runny nose, headache, blurry vision, dizziness, lightheadedness, nausea, vomiting, diarrhea, abdominal pain, urinary or bowel changes.  No history of smoking, alcohol, illicit drug use.  No history of A. fib or CHF in the past.  ED Course: Upon arrival to ED: Patient tachycardic-afebrile with no leukocytosis, other vital signs within normal limit.  On room air.  H&H: 10.6/32.2, troponin: 20, EKG shows A. fib with RVR with prolonged QT interval.  TSH: WNL, sodium: 134.  BNP, COVID-19 pending.  Chest x-ray shows cardiomegaly with mild pulmonary edema and small bilateral pleural effusions suggestive of congestive heart failure.  Patient received Lasix 20 mg IV once and started on diltiazem gtt. in ED.  [facility] hospitalist consulted for admission for new onset A. fib with RVR  and new onset CHF.  Review of Systems: As per HPI otherwise negative.    Past Medical History:  Diagnosis Date  . Basal cell carcinoma of leg    left 2014  . Hyperlipidemia   . Hypertension     Past Surgical History:  Procedure Laterality Date  . 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 She has never used smokeless tobacco. She reports that she does not drink alcohol and does not use drugs.  Allergies  Allergen Reactions  . Penicillins     Family History  Problem Relation Age of Onset  . Heart disease Father   . Heart disease Sister   . Heart disease Brother   . Hypertension Brother   . Breast cancer Neg Hx     Prior to Admission medications   Medication Sig Start Date End Date Taking? Authorizing Provider  alendronate (FOSAMAX) 70 MG tablet Take 1 tablet (70 mg total) by mouth every 7 (seven) days. Take with a full glass of water on an empty stomach. 05/14/19   Susy Frizzle, MD  aspirin 81 MG tablet Take 81 mg by mouth daily.    [provider]  LORazepam (ATIVAN) 0.5 MG tablet Take 1 tablet (0.5 mg total) by mouth 2 (two) times daily as needed for anxiety. 08/17/19   [place], Modena Nunnery, MD  losartan (COZAAR) 100 MG tablet TAKE 1 TABLET BY MOUTH EVERY DAY. 06/21/19   Susy Frizzle, MD  multivitamin-iron-minerals-folic acid (CENTRUM) chewable tablet Chew 1 tablet by mouth daily.  [provider]  nitrofurantoin, macrocrystal-monohydrate, (MACROBID) 100 MG capsule Take 1 capsule (100 mg total) by mouth 2 (two) times daily. 06/18/19   Susy Frizzle, MD  NONFORMULARY OR COMPOUNDED ITEM Antifungal solution: Terbinafine 3%, Fluconazole 2%, Tea Tree Oil 5%, Urea 10%, Ibuprofen 2% in DMSO suspension #32mL 02/02/19   [place], Stephani Police, DPM  Omega-3 Fatty Acids (FISH OIL) 1200 MG CAPS Take 1,200 mg by mouth every morning.     [provider]  simvastatin (ZOCOR) 20 MG tablet TAKE 1 TABLET BY MOUTH AT  BEDTIME 02/07/19   Susy Frizzle, MD    Physical Exam: Vitals:   10/12/19 1256 10/12/19 1300 10/12/19 1415  BP: (!) 146/96 (!) 145/96 (!) 114/101  Pulse: (!) 131 (!) 124   Resp: 16 23 21   Temp: 98 F (36.7 C)    TempSrc: Oral    SpO2: 95% 95% 90%    Constitutional: NAD, calm, comfortable, on room air, appears dehydrated, alert and following commands but not oriented to time place and person which is her baseline. Eyes: PERRL, lids and conjunctivae normal ENMT: Mucous membranes are dry. Posterior pharynx clear of any exudate or lesions.Normal dentition.  Neck: normal, supple, no masses, no thyromegaly Respiratory: clear to auscultation bilaterally, no wheezing, no crackles. Normal respiratory effort. No accessory muscle use.  Cardiovascular: Irregularly irregular rhythm.  No/ rubs / gallops.  Bilateral 2+ pitting edema positive 2+ pedal pulses. No carotid bruits.  Abdomen: no tenderness, no masses palpated. No hepatosplenomegaly. Bowel sounds positive.  Musculoskeletal: no clubbing / cyanosis. No joint deformity upper and lower extremities. Good ROM, no contractures. Normal muscle tone.  Skin: no rashes, lesions, ulcers. No induration Neurologic: Alert and following commands.  Not oriented to time place and person.  Moving all extremities equally.   Labs on Admission: I have personally reviewed following labs and imaging studies  CBC: Recent Labs  Lab 10/12/19 1305  WBC 6.1  HGB 10.6*  HCT 32.2*  MCV 92.3  PLT 458   Basic Metabolic Panel: Recent Labs  Lab 10/12/19 1305  NA 134*  K 4.6  CL 102  CO2 21*  GLUCOSE 109*  BUN 19  CREATININE 0.81  CALCIUM 8.7*   GFR: CrCl cannot be calculated (Unknown ideal weight.). Liver Function Tests: Recent Labs  Lab 10/12/19 1305  AST 37  ALT 36  ALKPHOS 75  BILITOT 0.6  PROT 6.4*  ALBUMIN 2.9*   No results for input(s): LIPASE, AMYLASE in the last 168 hours. No results for input(s): AMMONIA in the last 168  hours. Coagulation Profile: No results for input(s): INR, PROTIME in the last 168 hours. Cardiac Enzymes: No results for input(s): CKTOTAL, CKMB, CKMBINDEX, TROPONINI in the last 168 hours. BNP (last 3 results) No results for input(s): PROBNP in the last 8760 hours. HbA1C: No results for input(s): HGBA1C in the last 72 hours. CBG: No results for input(s): GLUCAP in the last 168 hours. Lipid Profile: No results for input(s): CHOL, HDL, LDLCALC, TRIG, CHOLHDL, LDLDIRECT in the last 72 hours. Thyroid Function Tests: Recent Labs    10/12/19 1305  TSH 1.279   Anemia Panel: No results for input(s): VITAMINB12, FOLATE, FERRITIN, TIBC, IRON, RETICCTPCT in the last 72 hours. Urine analysis:    Component Value Date/Time   COLORURINE YELLOW 06/29/2019 0954   APPEARANCEUR CLOUDY (A) 06/29/2019 0954   LABSPEC 1.020 06/29/2019 0954   PHURINE 7.5 06/29/2019 0954   GLUCOSEU NEGATIVE 06/29/2019 0954   HGBUR TRACE (A) 06/29/2019 0998  KETONESUR NEGATIVE 06/29/2019 0954   PROTEINUR NEGATIVE 06/29/2019 0954   NITRITE POSITIVE (A) 06/29/2019 0954   LEUKOCYTESUR 2+ (A) 06/29/2019 0954    Radiological Exams on Admission: DG Chest Port 1 View  Result Date: 10/12/2019 CLINICAL DATA:  Shortness of breath. Atrial fibrillation with rapid ventricular response. EXAM: PORTABLE CHEST 1 VIEW COMPARISON:  None. FINDINGS: 1321 hours. The heart is enlarged. There is aortic atherosclerosis and mild pulmonary edema. Right-greater-than-left basilar airspace opacities likely represent atelectasis. There are probable small bilateral pleural effusions. No pneumothorax. No acute osseous findings are evident. There are degenerative changes in the spine associated with a convex right thoracic scoliosis. Old fracture of the distal right clavicle noted. Telemetry leads overlie the chest. IMPRESSION: Cardiomegaly with mild pulmonary edema and probable small bilateral pleural effusions consistent with congestive heart  failure. Electronically Signed   By: Richardean Sale M.D.   On: 10/12/2019 13:43    EKG: Independently reviewed.  A. fib with RVR, prolonged QT interval.  No ST elevation or depression noted.  Assessment/Plan Principal Problem:   Atrial fibrillation with RVR (HCC) Active Problems:   Hyperlipidemia   Hypertension   Fluid overload   Normocytic anemia    New onset of A. fib with RVR: -Initial heart rate was noted to be in 170s by EMS.  She is afebrile, no leukocytosis, initial troponin: 20, TSH: WNL, BNP, COVID-19 pending.  Reviewed chest x-ray -Patient received Lasix 20 mg IV once and started on Cardizem drip in ED. -Admit patient to stepdown unit for close monitoring.  On telemetry.  Continue Cardizem gtt -We will get transthoracic echo. -CHA2DS2-VASc score is 4-we will start on Eliquis 2.5 mg p.o. twice daily (Discussed with patient's grandson & he agreed with it) -EDP consulted cardiology-await recommendations. -Monitor heart rate closely.  New onset CHF: -Patient presented with signs of fluid overload.  Reviewed chest x-ray.  TSH: WNL.  BNP: Pending. -We will get transthoracic echo.  Patient received Lasix 20 IV once in ED. -We will start on Lasix 40 IV twice daily -Monitor electrolytes closely.  Check magnesium level.  Monitor vitals and kidney function closely -Strict INO's and daily weight.  Hypertension: Stable -Continue losartan  Hyperlipidemia: Continue statin  Insomnia: Continue Ativan as needed  Normocytic anemia: H&H: 10.6/32.2 was 12.2/36.56 months ago. -No history of per rectal bleeding/NSAID use.  Monitor H&H closely.  Transfuse as needed. -We will check iron studies.  DVT prophylaxis: Eliquis/SCD Code Status: Full code-confirmed with patient's grandson at bedside  family Communication: Patient's grandson present at bedside.  Plan of care discussed with grandson in length and he verbalized understanding and agreed with it. Disposition Plan: SNF in 2 to 3  days Consults called: Cardiology by EDP Admission status: Inpatient   Mckinley Jewel MD [facility]  If 7PM-7AM, please contact night-coverage [email or web address] Password [facility]  10/12/2019, 3:04 PM

## 2019-10-12 NOTE — Progress Notes (Signed)
  Echocardiogram 2D Echocardiogram has been performed.  Shirley Bates 10/12/2019, 5:22 PM

## 2019-10-12 NOTE — Progress Notes (Signed)
Pt with Cardizem gtt for Afib. Noted on controlled rate with HR down to 60-70's ; BP=80/56. Cardizem gtt stopped. Dr Tonie Griffith informed. Currently rechecked BP=94/53. Will continue to monitor pt.

## 2019-10-13 DIAGNOSIS — I5021 Acute systolic (congestive) heart failure: Secondary | ICD-10-CM | POA: Diagnosis not present

## 2019-10-13 DIAGNOSIS — E782 Mixed hyperlipidemia: Secondary | ICD-10-CM

## 2019-10-13 DIAGNOSIS — I4891 Unspecified atrial fibrillation: Secondary | ICD-10-CM | POA: Diagnosis not present

## 2019-10-13 DIAGNOSIS — I10 Essential (primary) hypertension: Secondary | ICD-10-CM | POA: Diagnosis not present

## 2019-10-13 DIAGNOSIS — R8271 Bacteriuria: Secondary | ICD-10-CM

## 2019-10-13 LAB — LIPID PANEL
Cholesterol: 118 mg/dL (ref 0–200)
HDL: 43 mg/dL (ref >40)
LDL Cholesterol: 66 mg/dL (ref 0–99)
Total CHOL/HDL Ratio: 2.7 RATIO
Triglycerides: 46 mg/dL (ref <150)
VLDL: 9 mg/dL (ref 0–40)

## 2019-10-13 LAB — BASIC METABOLIC PANEL
Anion gap: 11 (ref 5–15)
BUN: 17 mg/dL (ref 8–23)
CO2: 26 mmol/L (ref 22–32)
Calcium: 8.4 mg/dL — ABNORMAL LOW (ref 8.9–10.3)
Chloride: 98 mmol/L (ref 98–111)
Creatinine, Ser: 0.97 mg/dL (ref 0.44–1.00)
GFR calc Af Amer: 59 mL/min — ABNORMAL LOW (ref >60)
GFR calc non Af Amer: 51 mL/min — ABNORMAL LOW (ref >60)
Glucose, Bld: 153 mg/dL — ABNORMAL HIGH (ref 70–99)
Potassium: 3.8 mmol/L (ref 3.5–5.1)
Sodium: 135 mmol/L (ref 135–145)

## 2019-10-13 LAB — CBC
HCT: 33.1 % — ABNORMAL LOW (ref 36.0–46.0)
Hemoglobin: 11 g/dL — ABNORMAL LOW (ref 12.0–15.0)
MCH: 30.1 pg (ref 26.0–34.0)
MCHC: 33.2 g/dL (ref 30.0–36.0)
MCV: 90.4 fL (ref 80.0–100.0)
Platelets: 307 10*3/uL (ref 150–400)
RBC: 3.66 MIL/uL — ABNORMAL LOW (ref 3.87–5.11)
RDW: 15.1 % (ref 11.5–15.5)
WBC: 6.6 10*3/uL (ref 4.0–10.5)
nRBC: 0 % (ref 0.0–0.2)

## 2019-10-13 MED ORDER — METOPROLOL TARTRATE 5 MG/5ML IV SOLN
5.0000 mg | INTRAVENOUS | Status: DC | PRN
  Administered 2019-10-14 – 2019-10-15 (×2): 5 mg via INTRAVENOUS
  Filled 2019-10-13 (×2): qty 5

## 2019-10-13 MED ORDER — LOSARTAN POTASSIUM 25 MG PO TABS
25.0000 mg | ORAL_TABLET | Freq: Every day | ORAL | Status: DC
  Administered 2019-10-13 – 2019-10-15 (×3): 25 mg via ORAL
  Filled 2019-10-13 (×3): qty 1

## 2019-10-13 MED ORDER — FERROUS SULFATE 325 (65 FE) MG PO TABS
325.0000 mg | ORAL_TABLET | Freq: Every day | ORAL | Status: DC
  Administered 2019-10-14 – 2019-10-17 (×4): 325 mg via ORAL
  Filled 2019-10-13 (×4): qty 1

## 2019-10-13 MED ORDER — FUROSEMIDE 10 MG/ML IJ SOLN
40.0000 mg | Freq: Two times a day (BID) | INTRAMUSCULAR | Status: DC
  Administered 2019-10-14: 40 mg via INTRAVENOUS
  Filled 2019-10-13 (×2): qty 4

## 2019-10-13 NOTE — Hospital Course (Addendum)
Shirley Bates is a 86 y.o. female with medical history significant of severe dementia, hypertension, hyperlipidemia, osteoporosis, recurrent UTI (with colonization) who presented to the ER emergency department with a cough that had been going on for approximately 2 to 3 days as well as change in mentation per her daughter.  The patient resides at an assisted living facility however requires a memory unit due to her severe dementia. Her daughter also stated that she has had recurrent UTIs in the past that have affected her mentation but that she may now also be colonized due to her number of infections. On work-up in the ER she was found to be in new onset A. fib with RVR with rate in the 170s.  She was started on a Cardizem infusion and admitted for further work-up and treatment.  Cardiology was also consulted on admission.  The morning following admission, CODE STATUS was discussed with her daughter.  She stated that her mom would not want to be on life support or put through resuscitation if her heart stopped especially knowing that that was the sign of a larger decline.  The patient's CODE STATUS was then changed to DNR on 10/13/2019 after discussion with her daughter bedside.  She continued to improve clinically with ongoing treatment for A. fib with RVR.  Her urine culture did ultimately grow out E. coli on 10/14/2019 but she remained afebrile with no leukocytosis and her mentation was already improving.  Therefore, this was considered colonization and if she showed further signs of developing infection, then antibiotics would be started.  Due to ongoing uncontrolled rate with underlying hypotension and patient becoming more lethargic, decision was made to pursue TEE cardioversion tentatively on 10/16/2019 per cardiology.  Social work has also met with patient and family.  Tentative plan is for discharging back to [facility] assisted living facility with physical therapy orders in place.

## 2019-10-13 NOTE — Assessment & Plan Note (Addendum)
-  New onset and underlying etiology possibly due to CHF exacerbation given elevated BNP and good response to lasix so far vs UTI which is recurrent for her (low suspicion, ruled out for now) - continue lasix - transitioned off cardizem drip to 120 mg oral daily; also on lopressor 25 mg BID; BP becoming soft; d/c losartan for now per cardiology rec's as well - EF 40-45% on echo with LV global hypokinesis. - continue eliquis  -Cardiology following, appreciate assistance - given hypotension with difficult to control rate, cardiology considering TEE CV tentatively for 7/27

## 2019-10-13 NOTE — Assessment & Plan Note (Addendum)
-  At her age of 59 with severe dementia, likely to not have any further mortality benefit from statin use (LDL 66 on lipid profile) -Discontinue Zocor

## 2019-10-13 NOTE — Assessment & Plan Note (Signed)
-   iron stores borderline low - start on oral iron

## 2019-10-13 NOTE — Assessment & Plan Note (Addendum)
-  Unclear if infectious versus colonized on admission.  No obvious signs of infection on lab work-up and in setting of dementia, difficult to know if truly symptomatic -Urine culture did ultimately grow out E. coli.  Her mentation was already improving with no antibiotics and she remained hemodynamically stable with no leukocytosis and remained afebrile.  At this time, we will hold off on antibiotics and consider this colonization unless she does develop further signs or symptoms suggesting true infection

## 2019-10-13 NOTE — Evaluation (Signed)
Physical Therapy Evaluation Patient Details Name: Shirley Bates MRN: [id number] DOB: 16-Sep-1925 Today's Date: 10/13/2019   History of Present Illness  86 yo female with onset of a-fib with RVR was admitted, noted pt in CHF w fluid overload.  Suspected of having UTI, LVEF was 40-45% yesterday.  PMHx:  dementia, UTI, e-coli, HTN, ALF resident  Clinical Impression  Pt was seen for eval after being in bed and a significant amount lethargic.  However, she was able to initiate mobility with sidesteps on side of bed.  Due to pt having a spike in BP with standing, she did not take a longer walk.  Sitting BP was 97/77 with pulse 115 and sat 93%, but standing was BP 120/105, pulse 97 and sat 97%.  Follow acutely and recommending CIR due to her abilities and need to be in a more medically controlled environment.      Follow Up Recommendations CIR    Equipment Recommendations  Rolling walker with 5" wheels    Recommendations for Other Services Rehab consult     Precautions / Restrictions Precautions Precautions: Fall Precaution Comments: monitor HR and BP Restrictions Weight Bearing Restrictions: No      Mobility  Bed Mobility Overal bed mobility: Needs Assistance Bed Mobility: Supine to Sit;Sit to Supine     Supine to sit: Mod assist Sit to supine: Min assist      Transfers Overall transfer level: Needs assistance Equipment used: Rolling walker (2 wheeled);1 person hand held assist Transfers: Sit to/from Stand Sit to Stand: Min assist         General transfer comment: dense cues for hand placement  Ambulation/Gait Ambulation/Gait assistance: Min assist Gait Distance (Feet): 5 Feet Assistive device: Rolling walker (2 wheeled);1 person hand held assist Gait Pattern/deviations: Step-to pattern;Wide base of support (sidesteps) Gait velocity: reduced Gait velocity interpretation: <1.31 ft/sec, indicative of household ambulator General Gait Details: sidesteps side of  bed  Stairs            Wheelchair Mobility    Modified Rankin (Stroke Patients Only)       Balance Overall balance assessment: Needs assistance Sitting-balance support: Feet supported Sitting balance-Leahy Scale: Fair     Standing balance support: Bilateral upper extremity supported;During functional activity Standing balance-Leahy Scale: Poor                               Pertinent Vitals/Pain Pain Assessment: No/denies pain    Home Living Family/patient expects to be discharged to:: Skilled nursing facility                 Additional Comments: was living in ALF    Prior Function Level of Independence: Needs assistance   Gait / Transfers Assistance Needed: SPC or rollator with mod I  ADL's / Homemaking Assistance Needed: has ALF staff to assist bathing and dressing        Hand Dominance   Dominant Hand: Right    Extremity/Trunk Assessment   Upper Extremity Assessment Upper Extremity Assessment: Generalized weakness    Lower Extremity Assessment Lower Extremity Assessment: Generalized weakness    Cervical / Trunk Assessment Cervical / Trunk Assessment: Kyphotic  Communication      Cognition Arousal/Alertness: Awake/alert;Lethargic Behavior During Therapy: Flat affect Overall Cognitive Status: History of cognitive impairments - at baseline  General Comments General comments (skin integrity, edema, etc.): pt was seen for mobility on RW, BP assessed in sitting and standing with BP spiking up to stand    Exercises     Assessment/Plan    PT Assessment Patient needs continued PT services  PT Problem List Decreased strength;Decreased range of motion;Decreased activity tolerance;Decreased balance;Decreased mobility;Decreased coordination;Decreased cognition;Decreased knowledge of use of DME;Decreased safety awareness;Cardiopulmonary status limiting activity       PT  Treatment Interventions DME instruction;Gait training;Therapeutic activities;Functional mobility training;Therapeutic exercise;Balance training;Neuromuscular re-education;Patient/family education    PT Goals (Current goals can be found in the Care Plan section)  Acute Rehab PT Goals Patient Stated Goal: none stated PT Goal Formulation: With patient/family Time For Goal Achievement: 10/27/19 Potential to Achieve Goals: Good    Frequency Min 3X/week   Barriers to discharge   home in ALF    Co-evaluation               AM-PAC PT "6 Clicks" Mobility  Outcome Measure Help needed turning from your back to your side while in a flat bed without using bedrails?: A Little Help needed moving from lying on your back to sitting on the side of a flat bed without using bedrails?: A Lot Help needed moving to and from a bed to a chair (including a wheelchair)?: A Little Help needed standing up from a chair using your arms (e.g., wheelchair or bedside chair)?: A Little Help needed to walk in hospital room?: A Little Help needed climbing 3-5 steps with a railing? : Total 6 Click Score: 15    End of Session Equipment Utilized During Treatment: Gait belt Activity Tolerance: Patient limited by fatigue;Treatment limited secondary to medical complications (Comment) Patient left: in bed;with call bell/phone within reach;with bed alarm set;with family/visitor present Nurse Communication: Mobility status PT Visit Diagnosis: Unsteadiness on feet (R26.81);Muscle weakness (generalized) (M62.81);Difficulty in walking, not elsewhere classified (R26.2)    Time: 8938-1017 PT Time Calculation (min) (ACUTE ONLY): 24 min   Charges:   PT Evaluation $PT Eval Moderate Complexity: 1 Mod PT Treatments $Therapeutic Activity: 8-22 mins       Ramond Dial 10/13/2019, 6:58 PM  Mee Hives, PT MS Acute Rehab Dept. Number: [place] [facility] [place]

## 2019-10-13 NOTE — Progress Notes (Signed)
PROGRESS NOTE    Shirley Bates   STM:[id number]  DOB: 19-Feb-1926  DOA: 10/12/2019     1  PCP: Shirley Frizzle, MD  CC: brought from nursing facility for cough, SOB  Hospital Course: Shirley Bates is a 86 y.o. female with medical history significant of severe dementia, hypertension, hyperlipidemia, osteoporosis, recurrent UTI (with colonization) who presented to the ER emergency department with a cough that had been going on for approximately 2 to 3 days as well as change in mentation per her daughter.  The patient resides at an assisted living facility however requires a memory unit due to her severe dementia. Her daughter also stated that she has had recurrent UTIs in the past that have affected her mentation but that she may now also be colonized due to her number of infections. On work-up in the ER she was found to be in new onset A. fib with RVR with rate in the 170s.  She was started on a Cardizem infusion and admitted for further work-up and treatment.  Cardiology was also consulted on admission.  The morning following admission, CODE STATUS was discussed with her daughter.  She stated that her mom would not want to be on life support or put through resuscitation if her heart stopped especially knowing that that was the sign of a larger decline.  The patient's CODE STATUS was then changed to DNR on 10/13/2019 after discussion with her daughter bedside.   Interval History:  Admitted overnight and found to be in new onset A. fib with RVR.  Spoke with her daughter bedside this morning after she arrived and updated her as well as discussed goals of care.  CODE STATUS changed to DNR after our discussion.  She is hopeful for patient returning back to her facility at discharge.  Old records reviewed in assessment of this patient  ROS: Review of systems not obtained due to patient factors.  Severe dementia  Assessment & Plan: Hyperlipidemia -At her age of 86 with severe dementia, likely  to not have any further mortality benefit from statin use (LDL 66 on lipid profile) -Discontinue Zocor  Hypertension -Blood pressure currently low normal especially on Cardizem drip.  Will monitor pressure while transitioning to orals  Atrial fibrillation with RVR ([place]) -New onset and underlying etiology possibly due to CHF exacerbation given elevated BNP and good response to lasix so far vs UTI which is recurrent for her.  However no fever nor any significant leukocytosis.  Difficult to ascertain mentation in setting of severe dementia nor if she is truly symptomatic - continue lasix - follow up UCx -Continue Cardizem drip and will transition to oral Lopressor. Uptitrate BB as tolerates - EF 40-45% on echo with LV global hypokinesis. -Continue medical management - continue eliquis  -Cardiology following, appreciate assistance  Fluid overload -Diuresing well -Continue Lasix  Normocytic anemia - iron stores borderline low - start on oral iron  Bacteriuria -Unclear if infectious versus colonized at this time.  No obvious signs of infection on lab work-up and in setting of dementia, difficult to know if truly symptomatic -Follow-up urine culture and will further decide if treatment warranted -Agree with cardiology on holding off on antibiotics at this time   Antimicrobials: None  DVT prophylaxis: Eliquis Code Status: DNR Family Communication: Daughter bedside Disposition Plan:  Status is: Inpatient  Remains inpatient appropriate because:Hemodynamically unstable, Altered mental status, Unsafe d/c plan, IV treatments appropriate due to intensity of illness or inability to take PO and  Inpatient level of care appropriate due to severity of illness   Dispo: The patient is from: ALF              Anticipated d/c is to: ALF              Anticipated d/c date is: 1 day              Patient currently is not medically stable to d/c.       Objective: Blood pressure 104/80, pulse  (!) 108, temperature (!) 97.5 F (36.4 C), temperature source Oral, resp. rate 22, weight 56 kg, SpO2 100 %.  Examination: General appearance: Chronically ill-appearing elderly woman laying in bed in no distress.  She is unable to answer questions appropriately.  She does follow some commands.  She startles very easily upon awakening her then calms down Head: Normocephalic, without obvious abnormality, atraumatic Eyes: EOMI Lungs: clear to auscultation bilaterally Heart: irregularly irregular rhythm and S1, S2 normal Abdomen: Soft, nontender, nondistended, bowel sounds present Extremities: No edema Skin: mobility and turgor normal Neurologic: Moves all 4 extremities.  Exam limited by severe dementia  Consultants:   Cardiology  Data Reviewed: I have personally reviewed following labs and imaging studies Results for orders placed or performed during the hospital encounter of 10/12/19 (from the past 24 hour(s))  Basic metabolic panel     Status: Abnormal   Collection Time: 10/13/19 11:56 AM  Result Value Ref Range   Sodium 135 135 - 145 mmol/L   Potassium 3.8 3.5 - 5.1 mmol/L   Chloride 98 98 - 111 mmol/L   CO2 26 22 - 32 mmol/L   Glucose, Bld 153 (H) 70 - 99 mg/dL   BUN 17 8 - 23 mg/dL   Creatinine, Ser 0.97 0.44 - 1.00 mg/dL   Calcium 8.4 (L) 8.9 - 10.3 mg/dL   GFR calc non Af Amer 51 (L) >60 mL/min   GFR calc Af Amer 59 (L) >60 mL/min   Anion gap 11 5 - 15  CBC     Status: Abnormal   Collection Time: 10/13/19 11:56 AM  Result Value Ref Range   WBC 6.6 4.0 - 10.5 K/uL   RBC 3.66 (L) 3.87 - 5.11 MIL/uL   Hemoglobin 11.0 (L) 12.0 - 15.0 g/dL   HCT 33.1 (L) 36 - 46 %   MCV 90.4 80.0 - 100.0 fL   MCH 30.1 26.0 - 34.0 pg   MCHC 33.2 30.0 - 36.0 g/dL   RDW 15.1 11.5 - 15.5 %   Platelets 307 150 - 400 K/uL   nRBC 0.0 0.0 - 0.2 %  Lipid panel     Status: None   Collection Time: 10/13/19 11:56 AM  Result Value Ref Range   Cholesterol 118 0 - 200 mg/dL   Triglycerides 46 <150  mg/dL   HDL 43 >40 mg/dL   Total CHOL/HDL Ratio 2.7 RATIO   VLDL 9 0 - 40 mg/dL   LDL Cholesterol 66 0 - 99 mg/dL    Recent Results (from the past 240 hour(s))  SARS Coronavirus 2 by RT PCR (hospital order, performed in [facility] hospital lab) Nasopharyngeal Nasopharyngeal Swab     Status: None   Collection Time: 10/12/19  3:50 PM   Specimen: Nasopharyngeal Swab  Result Value Ref Range Status   SARS Coronavirus 2 NEGATIVE NEGATIVE Final    Comment: (NOTE) SARS-CoV-2 target nucleic acids are NOT DETECTED.  The SARS-CoV-2 RNA is generally detectable in upper and lower  respiratory specimens during the acute phase of infection. The lowest concentration of SARS-CoV-2 viral copies this assay can detect is 250 copies / mL. A negative result does not preclude SARS-CoV-2 infection and should not be used as the sole basis for treatment or other patient management decisions.  A negative result may occur with improper specimen collection / handling, submission of specimen other than nasopharyngeal swab, presence of viral mutation(s) within the areas targeted by this assay, and inadequate number of viral copies (<250 copies / mL). A negative result must be combined with clinical observations, patient history, and epidemiological information.  Fact Sheet for Patients:   StrictlyIdeas.no  Fact Sheet for Healthcare Providers: BankingDealers.co.za  This test is not yet approved or  cleared by the [place] FDA and has been authorized for detection and/or diagnosis of SARS-CoV-2 by FDA under an Emergency Use Authorization (EUA).  This EUA will remain in effect (meaning this test can be used) for the duration of the COVID-19 declaration under Section 564(b)(1) of the Act, 21 U.S.C. section 360bbb-3(b)(1), unless the authorization is terminated or revoked sooner.  Performed at [facility], [place] [address], [place], [place] [address]       Radiology Studies: DG Chest Port 1 View  Result Date: 10/12/2019 CLINICAL DATA:  Shortness of breath. Atrial fibrillation with rapid ventricular response. EXAM: PORTABLE CHEST 1 VIEW COMPARISON:  None. FINDINGS: 1321 hours. The heart is enlarged. There is aortic atherosclerosis and mild pulmonary edema. Right-greater-than-left basilar airspace opacities likely represent atelectasis. There are probable small bilateral pleural effusions. No pneumothorax. No acute osseous findings are evident. There are degenerative changes in the spine associated with a convex right thoracic scoliosis. Old fracture of the distal right clavicle noted. Telemetry leads overlie the chest. IMPRESSION: Cardiomegaly with mild pulmonary edema and probable small bilateral pleural effusions consistent with congestive heart failure. Electronically Signed   By: Richardean Sale M.D.   On: 10/12/2019 13:43   ECHOCARDIOGRAM COMPLETE  Result Date: 10/12/2019    ECHOCARDIOGRAM REPORT   Patient Name:   ADRIEN DIETZMAN Date of Exam: 10/12/2019 Medical Rec #:  [id number]     Height:       61.3 in Accession #:    [id number]    Weight:       112.0 lb Date of Birth:  10-13-1925     BSA:          1.481 m Patient Age:    66 years      BP:           131/86 mmHg Patient Gender: F             HR:           136 bpm. Exam Location:  Inpatient Procedure: 2D Echo, Cardiac Doppler and Color Doppler Indications:    Atrial fibrillation  History:        Patient has prior history of Echocardiogram examinations. Risk                 Factors:Dyslipidemia and Hypertension.  Sonographer:    Clayton Lefort RDCS (AE) Referring Phys: [id number] [facility]hell Heinrich [facility] IMPRESSIONS  1. Left ventricular ejection fraction, by estimation, is 40 to 45%. The left ventricle has mildly decreased function. The left ventricle demonstrates global hypokinesis. There is moderate concentric left ventricular hypertrophy. Left ventricular diastolic function could not be evaluated.  2. Right ventricular  systolic function is normal. The right ventricular size is normal. There is moderately elevated pulmonary  artery systolic pressure.  3. Left atrial size was moderately dilated.  4. Right atrial size was moderately dilated.  5. The mitral valve is normal in structure. Mild mitral valve regurgitation. No evidence of mitral stenosis.  6. The aortic valve is grossly normal. Aortic valve regurgitation is mild to moderate. Mild aortic valve sclerosis is present, with no evidence of aortic valve stenosis. Comparison(s): Prior images unable to be directly viewed, comparison made by report only. Conclusion(s)/Recommendation(s): In atrial fibrillation with rapid ventricular response (130-140 bpm) throughout study. FINDINGS  Left Ventricle: Left ventricular ejection fraction, by estimation, is 40 to 45%. The left ventricle has mildly decreased function. The left ventricle demonstrates global hypokinesis. The left ventricular internal cavity size was normal in size. There is  moderate concentric left ventricular hypertrophy. Left ventricular diastolic function could not be evaluated due to atrial fibrillation. Left ventricular diastolic function could not be evaluated. Right Ventricle: The right ventricular size is normal. Right vetricular wall thickness was not assessed. Right ventricular systolic function is normal. There is moderately elevated pulmonary artery systolic pressure. The tricuspid regurgitant velocity is  3.00 m/s, and with an assumed right atrial pressure of 15 mmHg, the estimated right ventricular systolic pressure is 25.0 mmHg. Left Atrium: Left atrial size was moderately dilated. Right Atrium: Right atrial size was moderately dilated. Pericardium: Trivial pericardial effusion is present. Mitral Valve: The mitral valve is normal in structure. There is moderate thickening of the mitral valve leaflet(s). There is moderate calcification of the mitral valve leaflet(s). Mild to moderate mitral annular  calcification. Mild mitral valve regurgitation. No evidence of mitral valve stenosis. Tricuspid Valve: The tricuspid valve is normal in structure. Tricuspid valve regurgitation is mild. Aortic Valve: The aortic valve is grossly normal.. There is mild thickening and mild calcification of the aortic valve. Aortic valve regurgitation is mild to moderate. Aortic regurgitation PHT measures 507 msec. Mild aortic valve sclerosis is present, with no evidence of aortic valve stenosis. Mild to moderate aortic valve annular calcification. There is mild thickening of the aortic valve. There is mild calcification of the aortic valve. Aortic valve mean gradient measures 3.4 mmHg. Aortic valve peak  gradient measures 6.2 mmHg. Aortic valve area, by VTI measures 1.69 cm. Pulmonic Valve: The pulmonic valve was not well visualized. Pulmonic valve regurgitation is not visualized. Aorta: The aortic root and ascending aorta are structurally normal, with no evidence of dilitation. IAS/Shunts: The atrial septum is grossly normal.  LEFT VENTRICLE PLAX 2D LVIDd:         4.00 cm LVIDs:         3.30 cm LV PW:         1.40 cm LV IVS:        1.50 cm LVOT diam:     1.90 cm LV SV:         32 LV SV Index:   22 LVOT Area:     2.84 cm  RIGHT VENTRICLE            IVC RV Basal diam:  3.40 cm    IVC diam: 2.40 cm RV S prime:     8.81 cm/s TAPSE (M-mode): 1.1 cm LEFT ATRIUM           Index       RIGHT ATRIUM           Index LA diam:      4.60 cm 3.11 cm/m  RA Area:     21.30 cm LA Vol (A4C): 73.5 ml  49.63 ml/m RA Volume:   61.20 ml  41.32 ml/m  AORTIC VALVE AV Area (Vmax):    1.65 cm AV Area (Vmean):   1.38 cm AV Area (VTI):     1.69 cm AV Vmax:           124.00 cm/s AV Vmean:          91.000 cm/s AV VTI:            0.191 m AV Peak Grad:      6.2 mmHg AV Mean Grad:      3.4 mmHg LVOT Vmax:         72.06 cm/s LVOT Vmean:        44.380 cm/s LVOT VTI:          0.114 m LVOT/AV VTI ratio: 0.60 AI PHT:            507 msec  AORTA Ao Root diam: 3.60 cm  Ao Asc diam:  3.70 cm MR Peak grad:    99.2 mmHg   TRICUSPID VALVE MR Mean grad:    67.0 mmHg   TR Peak grad:   36.0 mmHg MR Vmax:         498.00 cm/s TR Vmax:        300.00 cm/s MR Vmean:        389.0 cm/s MR PISA:         1.01 cm    SHUNTS MR PISA Eff ROA: 8 mm       Systemic VTI:  0.11 m MR PISA Radius:  0.40 cm     Systemic Diam: 1.90 cm Buford Dresser MD Electronically signed by Buford Dresser MD Signature Date/Time: 10/12/2019/10:50:45 PM    Final    DG Chest Port 1 View  Final Result       Scheduled Meds: . apixaban  2.5 mg Oral BID  . aspirin EC  81 mg Oral Daily  . diltiazem  10 mg Intravenous Once  . [START ON 10/14/2019] ferrous sulfate  325 mg Oral Q breakfast  . furosemide  40 mg Intravenous Q12H  . losartan  25 mg Oral Daily  . metoprolol tartrate  25 mg Oral BID   PRN Meds: acetaminophen, guaiFENesin-dextromethorphan, ipratropium-albuterol, LORazepam, metoprolol tartrate, ondansetron (ZOFRAN) IV Continuous Infusions: . diltiazem (CARDIZEM) infusion 5 mg/hr (10/13/19 0600)      LOS: 1 day  Time spent: Greater than 50% of the 35 minute visit was spent in counseling/coordination of care for the patient as laid out in the A&P.   Dwyane Dee, MD [facility] 10/13/2019, 6:22 PM   Contact via secure chat.  To contact the attending provider between 7A-7P or the covering provider during after hours 7P-7A, please log into the web site [email or web address] and access using universal [place] password for that web site. If you do not have the password, please call the hospital operator.

## 2019-10-13 NOTE — Assessment & Plan Note (Signed)
-  Diuresing well -Continue Lasix

## 2019-10-13 NOTE — Assessment & Plan Note (Addendum)
-  Blood pressure currently low normal especially on Cardizem drip.  Will monitor pressure while transitioning to orals - home losartan initially decreased, now stopped due to hypotension

## 2019-10-13 NOTE — Progress Notes (Signed)
DAILY PROGRESS NOTE   Patient Name: Shirley Bates Date of Encounter: 10/13/2019 Cardiologist: No primary care provider on file.  Chief Complaint   Breathing better today  Patient Profile   Shirley Bates is a 86 y.o. female with a history of dementia, hypertension, hyperlipidemia, osteoporosis, and recurrent UTI who is being seen today for the evaluation of atrial fibrillation with RVR at the request of Dr. Doristine Bosworth.  Subjective   Diuresed -1.5L overnight. BNP 838. Troponin flat around 20. UA shows many bacteria and leukocytes, suggestive of UTI. Echo yesterday shows LVEF 40-45%, moderate biatrial enlargement, mild to moderate AI  Objective   Vitals:   10/12/19 2312 10/12/19 2353 10/13/19 0308 10/13/19 0438  BP: 95/70 100/81 (!) 137/91 126/82  Pulse: 70  86   Resp: 18 15 17 20   Temp:  97.8 F (36.6 C) 97.9 F (36.6 C) 98 F (36.7 C)  TempSrc:  Oral Oral Oral  SpO2: 97% 97% 100% 95%  Weight:    56 kg    Intake/Output Summary (Last 24 hours) at 10/13/2019 2637 Last data filed at 10/13/2019 0600 Gross per 24 hour  Intake 218.08 ml  Output 1800 ml  Net -1581.92 ml   Filed Weights   10/13/19 0438  Weight: 56 kg    Physical Exam   General appearance: alert, appears stated age, no distress and pale Neck: JVD - 3 cm above sternal notch, no carotid bruit and thyroid not enlarged, symmetric, no tenderness/mass/nodules Lungs: diminished breath sounds bilaterally Heart: irregularly irregular rhythm Abdomen: soft, non-tender; bowel sounds normal; no masses,  no organomegaly Extremities: extremities normal, atraumatic, no cyanosis or edema Pulses: 2+ and symmetric Skin: pale, warm, dry Neurologic: Mental status: Awakens to voice, confused, mitt on left hand Psych: Intemittent confusion  Inpatient Medications    Scheduled Meds: . apixaban  2.5 mg Oral BID  . aspirin EC  81 mg Oral Daily  . diltiazem  10 mg Intravenous Once  . furosemide  40 mg Intravenous Q12H  .  losartan  25 mg Oral Daily  . metoprolol tartrate  25 mg Oral BID  . simvastatin  20 mg Oral QHS    Continuous Infusions: . diltiazem (CARDIZEM) infusion 5 mg/hr (10/13/19 0600)    PRN Meds: acetaminophen, guaiFENesin-dextromethorphan, ipratropium-albuterol, LORazepam, ondansetron (ZOFRAN) IV   Labs   Results for orders placed or performed during the hospital encounter of 10/12/19 (from the past 48 hour(s))  CBC     Status: Abnormal   Collection Time: 10/12/19  1:05 PM  Result Value Ref Range   WBC 6.1 4.0 - 10.5 K/uL   RBC 3.49 (L) 3.87 - 5.11 MIL/uL   Hemoglobin 10.6 (L) 12.0 - 15.0 g/dL   HCT 32.2 (L) 36 - 46 %   MCV 92.3 80.0 - 100.0 fL   MCH 30.4 26.0 - 34.0 pg   MCHC 32.9 30.0 - 36.0 g/dL   RDW 15.5 11.5 - 15.5 %   Platelets 333 150 - 400 K/uL   nRBC 0.0 0.0 - 0.2 %    Comment: Performed at [facility], [place] [address], [place], [place] [address]  Comprehensive metabolic panel     Status: Abnormal   Collection Time: 10/12/19  1:05 PM  Result Value Ref Range   Sodium 134 (L) 135 - 145 mmol/L   Potassium 4.6 3.5 - 5.1 mmol/L   Chloride 102 98 - 111 mmol/L   CO2 21 (L) 22 - 32 mmol/L   Glucose, Bld  109 (H) 70 - 99 mg/dL    Comment: Glucose reference range applies only to samples taken after fasting for at least 8 hours.   BUN 19 8 - 23 mg/dL   Creatinine, Ser 0.81 0.44 - 1.00 mg/dL   Calcium 8.7 (L) 8.9 - 10.3 mg/dL   Total Protein 6.4 (L) 6.5 - 8.1 g/dL   Albumin 2.9 (L) 3.5 - 5.0 g/dL   AST 37 15 - 41 U/L   ALT 36 0 - 44 U/L   Alkaline Phosphatase 75 38 - 126 U/L   Total Bilirubin 0.6 0.3 - 1.2 mg/dL   GFR calc non Af Amer >60 >60 mL/min   GFR calc Af Amer >60 >60 mL/min   Anion gap 11 5 - 15    Comment: Performed at [facility] [address] [place], [place] [address]  Troponin I (High Sensitivity)     Status: Abnormal   Collection Time: 10/12/19  1:05 PM  Result Value Ref Range   Troponin I (High Sensitivity) 20 (H) <18 ng/L    Comment:  (NOTE) Elevated high sensitivity troponin I (hsTnI) values and significant  changes across serial measurements may suggest ACS but many other  chronic and acute conditions are known to elevate hsTnI results.  Refer to the "Links" section for chest pain algorithms and additional  guidance. Performed at [facility], [place] [address], [place], [place] [address]   TSH     Status: None   Collection Time: 10/12/19  1:05 PM  Result Value Ref Range   TSH 1.279 0.350 - 4.500 uIU/mL    Comment: Performed by a 3rd Generation assay with a functional sensitivity of <=0.01 uIU/mL. Performed at [facility], [place] [address], [place], [place] [address]   Urinalysis, Routine w reflex microscopic     Status: Abnormal   Collection Time: 10/12/19  3:28 PM  Result Value Ref Range   Color, Urine AMBER (A) YELLOW    Comment: BIOCHEMICALS MAY BE AFFECTED BY COLOR   APPearance CLOUDY (A) CLEAR   Specific Gravity, Urine 1.021 1.005 - 1.030   pH 5.0 5.0 - 8.0   Glucose, UA NEGATIVE NEGATIVE mg/dL   Hgb urine dipstick NEGATIVE NEGATIVE   Bilirubin Urine NEGATIVE NEGATIVE   Ketones, ur NEGATIVE NEGATIVE mg/dL   Protein, ur 30 (A) NEGATIVE mg/dL   Nitrite NEGATIVE NEGATIVE   Leukocytes,Ua LARGE (A) NEGATIVE   RBC / HPF 6-10 0 - 5 RBC/hpf   WBC, UA >50 (H) 0 - 5 WBC/hpf   Bacteria, UA MANY (A) NONE SEEN   Squamous Epithelial / LPF 6-10 0 - 5   Mucus PRESENT     Comment: Performed at [facility], [address], [place], [place] [address]  Brain natriuretic peptide     Status: Abnormal   Collection Time: 10/12/19  3:50 PM  Result Value Ref Range   B Natriuretic Peptide 838.8 (H) 0.0 - 100.0 pg/mL    Comment: Performed at [facility] [address], [place], [place] [address]  SARS Coronavirus 2 by RT PCR (hospital order, performed in [facility] hospital lab) Nasopharyngeal Nasopharyngeal Swab     Status: None   Collection Time: 10/12/19  3:50 PM   Specimen: Nasopharyngeal Swab    Result Value Ref Range   SARS Coronavirus 2 NEGATIVE NEGATIVE    Comment: (NOTE) SARS-CoV-2 target nucleic acids are NOT DETECTED.  The SARS-CoV-2 RNA is generally detectable in upper  and lower respiratory specimens during the acute phase of infection. The lowest concentration of SARS-CoV-2 viral copies this assay can detect is 250 copies / mL. A negative result does not preclude SARS-CoV-2 infection and should not be used as the sole basis for treatment or other patient management decisions.  A negative result may occur with improper specimen collection / handling, submission of specimen other than nasopharyngeal swab, presence of viral mutation(s) within the areas targeted by this assay, and inadequate number of viral copies (<250 copies / mL). A negative result must be combined with clinical observations, patient history, and epidemiological information.  Fact Sheet for Patients:   StrictlyIdeas.no  Fact Sheet for Healthcare Providers: BankingDealers.co.za  This test is not yet approved or  cleared by the [place] FDA and has been authorized for detection and/or diagnosis of SARS-CoV-2 by FDA under an Emergency Use Authorization (EUA).  This EUA will remain in effect (meaning this test can be used) for the duration of the COVID-19 declaration under Section 564(b)(1) of the Act, 21 U.S.C. section 360bbb-3(b)(1), unless the authorization is terminated or revoked sooner.  Performed at [facility], [place] [address] [place], [place] [address]   Troponin I (High Sensitivity)     Status: Abnormal   Collection Time: 10/12/19  3:50 PM  Result Value Ref Range   Troponin I (High Sensitivity) 22 (H) <18 ng/L    Comment: (NOTE) Elevated high sensitivity troponin I (hsTnI) values and significant  changes across serial measurements may suggest ACS but many other  chronic and acute conditions are known to elevate hsTnI results.   Refer to the "Links" section for chest pain algorithms and additional  guidance. Performed at [facility], [place] [address] [place], [place] [address]   Magnesium     Status: None   Collection Time: 10/12/19  3:50 PM  Result Value Ref Range   Magnesium 2.0 1.7 - 2.4 mg/dL    Comment: Performed at [facility], [place] [address], [place], [place] [address]  Iron and TIBC     Status: Abnormal   Collection Time: 10/12/19  3:50 PM  Result Value Ref Range   Iron 30 28 - 170 ug/dL   TIBC 330 250 - 450 ug/dL   Saturation Ratios 9 (L) 10.4 - 31.8 %   UIBC 300 ug/dL    Comment: Performed at [facility], [place] [address], [place], [place] [address]  Ferritin     Status: None   Collection Time: 10/12/19  3:50 PM  Result Value Ref Range   Ferritin 47 11 - 307 ng/mL    Comment: Performed at [facility], [place] [address] [place], [place] [address]    ECG   N/A  Telemetry    Afib 80-100, PVC's- Personally Reviewed  Radiology    DG Chest Port 1 View  Result Date: 10/12/2019 CLINICAL DATA:  Shortness of breath. Atrial fibrillation with rapid ventricular response. EXAM: PORTABLE CHEST 1 VIEW COMPARISON:  None. FINDINGS: 1321 hours. The heart is enlarged. There is aortic atherosclerosis and mild pulmonary edema. Right-greater-than-left basilar airspace opacities likely represent atelectasis. There are probable small bilateral pleural effusions. No pneumothorax. No acute osseous findings are evident. There are degenerative changes in the spine associated with a convex right thoracic scoliosis. Old fracture of the distal right clavicle noted. Telemetry leads overlie the chest. IMPRESSION: Cardiomegaly with mild pulmonary edema and probable small bilateral pleural effusions consistent with congestive heart failure. Electronically  Signed   By: Richardean Sale M.D.   On: 10/12/2019 13:43   ECHOCARDIOGRAM COMPLETE  Result Date: 10/12/2019    ECHOCARDIOGRAM REPORT   Patient Name:    BETTYJO LUNDBLAD Date of Exam: 10/12/2019 Medical Rec #:  [id number]     Height:       61.3 in Accession #:    [id number]    Weight:       112.0 lb Date of Birth:  1926-03-22     BSA:          1.481 m Patient Age:    28 years      BP:           131/86 mmHg Patient Gender: F             HR:           136 bpm. Exam Location:  Inpatient Procedure: 2D Echo, Cardiac Doppler and Color Doppler Indications:    Atrial fibrillation  History:        Patient has prior history of Echocardiogram examinations. Risk                 Factors:Dyslipidemia and Hypertension.  Sonographer:    Clayton Lefort RDCS (AE) Referring Phys: [id number] Michell Heinrich [facility] IMPRESSIONS  1. Left ventricular ejection fraction, by estimation, is 40 to 45%. The left ventricle has mildly decreased function. The left ventricle demonstrates global hypokinesis. There is moderate concentric left ventricular hypertrophy. Left ventricular diastolic function could not be evaluated.  2. Right ventricular systolic function is normal. The right ventricular size is normal. There is moderately elevated pulmonary artery systolic pressure.  3. Left atrial size was moderately dilated.  4. Right atrial size was moderately dilated.  5. The mitral valve is normal in structure. Mild mitral valve regurgitation. No evidence of mitral stenosis.  6. The aortic valve is grossly normal. Aortic valve regurgitation is mild to moderate. Mild aortic valve sclerosis is present, with no evidence of aortic valve stenosis. Comparison(s): Prior images unable to be directly viewed, comparison made by report only. Conclusion(s)/Recommendation(s): In atrial fibrillation with rapid ventricular response (130-140 bpm) throughout study. FINDINGS  Left Ventricle: Left ventricular ejection fraction, by estimation, is 40 to 45%. The left ventricle has mildly decreased function. The left ventricle demonstrates global hypokinesis. The left ventricular internal cavity size was normal in size. There is  moderate  concentric left ventricular hypertrophy. Left ventricular diastolic function could not be evaluated due to atrial fibrillation. Left ventricular diastolic function could not be evaluated. Right Ventricle: The right ventricular size is normal. Right vetricular wall thickness was not assessed. Right ventricular systolic function is normal. There is moderately elevated pulmonary artery systolic pressure. The tricuspid regurgitant velocity is  3.00 m/s, and with an assumed right atrial pressure of 15 mmHg, the estimated right ventricular systolic pressure is 51.7 mmHg. Left Atrium: Left atrial size was moderately dilated. Right Atrium: Right atrial size was moderately dilated. Pericardium: Trivial pericardial effusion is present. Mitral Valve: The mitral valve is normal in structure. There is moderate thickening of the mitral valve leaflet(s). There is moderate calcification of the mitral valve leaflet(s). Mild to moderate mitral annular calcification. Mild mitral valve regurgitation. No evidence of mitral valve stenosis. Tricuspid Valve: The tricuspid valve is normal in structure. Tricuspid valve regurgitation is mild. Aortic Valve: The aortic valve is grossly normal.. There is mild thickening and mild calcification of the aortic valve. Aortic valve regurgitation is mild to moderate. Aortic regurgitation PHT measures  507 msec. Mild aortic valve sclerosis is present, with no evidence of aortic valve stenosis. Mild to moderate aortic valve annular calcification. There is mild thickening of the aortic valve. There is mild calcification of the aortic valve. Aortic valve mean gradient measures 3.4 mmHg. Aortic valve peak  gradient measures 6.2 mmHg. Aortic valve area, by VTI measures 1.69 cm. Pulmonic Valve: The pulmonic valve was not well visualized. Pulmonic valve regurgitation is not visualized. Aorta: The aortic root and ascending aorta are structurally normal, with no evidence of dilitation. IAS/Shunts: The atrial  septum is grossly normal.  LEFT VENTRICLE PLAX 2D LVIDd:         4.00 cm LVIDs:         3.30 cm LV PW:         1.40 cm LV IVS:        1.50 cm LVOT diam:     1.90 cm LV SV:         32 LV SV Index:   22 LVOT Area:     2.84 cm  RIGHT VENTRICLE            IVC RV Basal diam:  3.40 cm    IVC diam: 2.40 cm RV S prime:     8.81 cm/s TAPSE (M-mode): 1.1 cm LEFT ATRIUM           Index       RIGHT ATRIUM           Index LA diam:      4.60 cm 3.11 cm/m  RA Area:     21.30 cm LA Vol (A4C): 73.5 ml 49.63 ml/m RA Volume:   61.20 ml  41.32 ml/m  AORTIC VALVE AV Area (Vmax):    1.65 cm AV Area (Vmean):   1.38 cm AV Area (VTI):     1.69 cm AV Vmax:           124.00 cm/s AV Vmean:          91.000 cm/s AV VTI:            0.191 m AV Peak Grad:      6.2 mmHg AV Mean Grad:      3.4 mmHg LVOT Vmax:         72.06 cm/s LVOT Vmean:        44.380 cm/s LVOT VTI:          0.114 m LVOT/AV VTI ratio: 0.60 AI PHT:            507 msec  AORTA Ao Root diam: 3.60 cm Ao Asc diam:  3.70 cm MR Peak grad:    99.2 mmHg   TRICUSPID VALVE MR Mean grad:    67.0 mmHg   TR Peak grad:   36.0 mmHg MR Vmax:         498.00 cm/s TR Vmax:        300.00 cm/s MR Vmean:        389.0 cm/s MR PISA:         1.01 cm    SHUNTS MR PISA Eff ROA: 8 mm       Systemic VTI:  0.11 m MR PISA Radius:  0.40 cm     Systemic Diam: 1.90 cm Buford Dresser MD Electronically signed by Buford Dresser MD Signature Date/Time: 10/12/2019/10:50:45 PM    Final     Cardiac Studies   See echo above  Assessment   1. Principal Problem: 2.   Atrial fibrillation with RVR (  [place]) 3. Active Problems: 4.   Hyperlipidemia 5.   Hypertension 6.   Fluid overload 7.   Normocytic anemia 8.   Plan   1. Good diuresis on IV lasix, continue. Continue Eliquis for anticoagulation. Afib is rate-controlled on IV diltiazem - given cardiomyopathy, also on po metoprolol. Would try to wean of diltiazem today to keep HR <110. UA is dirty, history of E. Coli with resistance,  ?colonization vs active infection. Looks like it has been sensitive to Rochephin in the past. Will send urine culture - hold on treatment for now.  Time Spent Directly with Patient:  I have spent a total of 25 minutes with the patient reviewing hospital notes, telemetry, EKGs, labs and examining the patient as well as establishing an assessment and plan that was discussed personally with the patient.  > 50% of time was spent in direct patient care.  Length of Stay:  LOS: 1 day   Pixie Casino, MD, [facility], [place] Director of the Advanced Lipid Disorders &  Cardiovascular Risk Reduction Clinic Diplomate of the American Board of Clinical Lipidology Attending Cardiologist  Direct Dial: [phone]  Fax: [phone]  Website:  [email or web address] 10/13/2019, 9:23 AM

## 2019-10-14 DIAGNOSIS — R8271 Bacteriuria: Secondary | ICD-10-CM | POA: Diagnosis not present

## 2019-10-14 DIAGNOSIS — Z789 Other specified health status: Secondary | ICD-10-CM

## 2019-10-14 DIAGNOSIS — Z7409 Other reduced mobility: Secondary | ICD-10-CM

## 2019-10-14 DIAGNOSIS — I4891 Unspecified atrial fibrillation: Secondary | ICD-10-CM | POA: Diagnosis not present

## 2019-10-14 LAB — CBC WITH DIFFERENTIAL/PLATELET
Abs Immature Granulocytes: 0.03 10*3/uL (ref 0.00–0.07)
Basophils Absolute: 0 10*3/uL (ref 0.0–0.1)
Basophils Relative: 1 %
Eosinophils Absolute: 0.2 10*3/uL (ref 0.0–0.5)
Eosinophils Relative: 3 %
HCT: 31.1 % — ABNORMAL LOW (ref 36.0–46.0)
Hemoglobin: 10.2 g/dL — ABNORMAL LOW (ref 12.0–15.0)
Immature Granulocytes: 0 %
Lymphocytes Relative: 15 %
Lymphs Abs: 1 10*3/uL (ref 0.7–4.0)
MCH: 29.3 pg (ref 26.0–34.0)
MCHC: 32.8 g/dL (ref 30.0–36.0)
MCV: 89.4 fL (ref 80.0–100.0)
Monocytes Absolute: 0.7 10*3/uL (ref 0.1–1.0)
Monocytes Relative: 10 %
Neutro Abs: 4.9 10*3/uL (ref 1.7–7.7)
Neutrophils Relative %: 71 %
Platelets: 315 10*3/uL (ref 150–400)
RBC: 3.48 MIL/uL — ABNORMAL LOW (ref 3.87–5.11)
RDW: 14.9 % (ref 11.5–15.5)
WBC: 6.9 10*3/uL (ref 4.0–10.5)
nRBC: 0 % (ref 0.0–0.2)

## 2019-10-14 LAB — BASIC METABOLIC PANEL
Anion gap: 9 (ref 5–15)
BUN: 17 mg/dL (ref 8–23)
CO2: 29 mmol/L (ref 22–32)
Calcium: 8.2 mg/dL — ABNORMAL LOW (ref 8.9–10.3)
Chloride: 97 mmol/L — ABNORMAL LOW (ref 98–111)
Creatinine, Ser: 0.98 mg/dL (ref 0.44–1.00)
GFR calc Af Amer: 58 mL/min — ABNORMAL LOW (ref >60)
GFR calc non Af Amer: 50 mL/min — ABNORMAL LOW (ref >60)
Glucose, Bld: 107 mg/dL — ABNORMAL HIGH (ref 70–99)
Potassium: 3.7 mmol/L (ref 3.5–5.1)
Sodium: 135 mmol/L (ref 135–145)

## 2019-10-14 LAB — MAGNESIUM: Magnesium: 1.9 mg/dL (ref 1.7–2.4)

## 2019-10-14 MED ORDER — LORATADINE 10 MG PO TABS
10.0000 mg | ORAL_TABLET | Freq: Every day | ORAL | Status: DC
  Administered 2019-10-14 – 2019-10-17 (×4): 10 mg via ORAL
  Filled 2019-10-14 (×4): qty 1

## 2019-10-14 MED ORDER — FUROSEMIDE 40 MG PO TABS
40.0000 mg | ORAL_TABLET | Freq: Every day | ORAL | Status: DC
  Filled 2019-10-14: qty 1

## 2019-10-14 MED ORDER — DILTIAZEM HCL ER COATED BEADS 120 MG PO TB24
120.0000 mg | ORAL_TABLET | Freq: Every day | ORAL | Status: DC
  Filled 2019-10-14: qty 1

## 2019-10-14 MED ORDER — DILTIAZEM HCL ER COATED BEADS 120 MG PO CP24
120.0000 mg | ORAL_CAPSULE | Freq: Every day | ORAL | Status: DC
  Administered 2019-10-14 – 2019-10-16 (×3): 120 mg via ORAL
  Filled 2019-10-14 (×3): qty 1

## 2019-10-14 NOTE — Progress Notes (Signed)
PROGRESS NOTE    Shirley Bates   LNL:[id number]  DOB: 1925-04-25  DOA: 10/12/2019     2  PCP: Susy Frizzle, MD  CC: brought from nursing facility for cough, SOB  Hospital Course: Shirley Bates is a 86 y.o. female with medical history significant of severe dementia, hypertension, hyperlipidemia, osteoporosis, recurrent UTI (with colonization) who presented to the ER emergency department with a cough that had been going on for approximately 2 to 3 days as well as change in mentation per her daughter.  The patient resides at an assisted living facility however requires a memory unit due to her severe dementia. Her daughter also stated that she has had recurrent UTIs in the past that have affected her mentation but that she may now also be colonized due to her number of infections. On work-up in the ER she was found to be in new onset A. fib with RVR with rate in the 170s.  She was started on a Cardizem infusion and admitted for further work-up and treatment.  Cardiology was also consulted on admission.  The morning following admission, CODE STATUS was discussed with her daughter.  She stated that her mom would not want to be on life support or put through resuscitation if her heart stopped especially knowing that that was the sign of a larger decline.  The patient's CODE STATUS was then changed to DNR on 10/13/2019 after discussion with her daughter bedside.  She continued to improve clinically with ongoing treatment for A. fib with RVR.  Her urine culture did ultimately grow out E. coli on 10/14/2019 but she remained afebrile with no leukocytosis and her mentation was already improving.  Therefore, this was considered colonization and if she showed further signs of developing infection, then antibiotics would be started.   Interval History:  Admitted and found to be in new onset A. fib with RVR.  Spoke with her daughter bedside this morning again who is present.  Overall patient has been  improving.  Her mentation is also much better than it was when she came in.  We discussed holding off on treating urine culture for now given suspicion for colonization at this time.  We discussed PT recommending inpatient rehab and she is considering options especially given that she requires a memory unit for her underlying dementia.  Old records reviewed in assessment of this patient  ROS: Review of systems not obtained due to patient factors.  Severe dementia  Assessment & Plan: Hyperlipidemia -At her age of 11 with severe dementia, likely to not have any further mortality benefit from statin use (LDL 66 on lipid profile) -Discontinue Zocor  Hypertension -Blood pressure currently low normal especially on Cardizem drip.  Will monitor pressure while transitioning to orals  Atrial fibrillation with RVR ([place]) -New onset and underlying etiology possibly due to CHF exacerbation given elevated BNP and good response to lasix so far vs UTI which is recurrent for her.  However no fever nor any significant leukocytosis.  Difficult to ascertain mentation in setting of severe dementia nor if she is truly symptomatic - continue lasix - follow up UCx -Continue Cardizem drip and will transition to oral Lopressor. Uptitrate BB as tolerates - EF 40-45% on echo with LV global hypokinesis. -Continue medical management - continue eliquis  -Cardiology following, appreciate assistance  Fluid overload -Diuresing well -Continue Lasix  Normocytic anemia - iron stores borderline low - start on oral iron  Bacteriuria -Unclear if infectious versus colonized on admission.  No obvious signs of infection on lab work-up and in setting of dementia, difficult to know if truly symptomatic -Urine culture did ultimately grow out E. coli.  Her mentation was already improving with no antibiotics and she remained hemodynamically stable with no leukocytosis and remained afebrile.  At this time, we will hold off on  antibiotics and consider this colonization unless she does develop further signs or symptoms suggesting true infection  Impaired mobility and activities of daily living - patient lives in ALF in memory unit; PT has recommended CIR - daughter is considering CIR but also wishes to discuss options with her ALF - continue working with PT    Antimicrobials: None  DVT prophylaxis: Eliquis Code Status: DNR Family Communication: Daughter bedside Disposition Plan:  Status is: Inpatient  Remains inpatient appropriate because:Hemodynamically unstable, Altered mental status, Unsafe d/c plan, IV treatments appropriate due to intensity of illness or inability to take PO and Inpatient level of care appropriate due to severity of illness   Dispo: The patient is from: ALF              Anticipated d/c is to: ALF              Anticipated d/c date is: 1 day              Patient currently is not medically stable to d/c.  Objective: Blood pressure 101/71, pulse 90, temperature 97.6 F (36.4 C), temperature source Oral, resp. rate 17, weight 57.6 kg, SpO2 95 %.  Examination: General appearance: Elderly demented woman laying in bed in no distress and is able to answer some questions.  Daughter is bedside. Head: Normocephalic, without obvious abnormality, atraumatic Eyes: EOMI Lungs: clear to auscultation bilaterally Heart: irregularly irregular rhythm and S1, S2 normal Abdomen: Soft, nontender, nondistended, bowel sounds present Extremities: No edema Skin: mobility and turgor normal Neurologic: Moves all 4 extremities.  Exam limited by severe dementia  Consultants:   Cardiology  Data Reviewed: I have personally reviewed following labs and imaging studies Results for orders placed or performed during the hospital encounter of 10/12/19 (from the past 24 hour(s))  Urine Culture     Status: Abnormal (Preliminary result)   Collection Time: 10/13/19  3:33 PM   Specimen: Urine, Clean Catch  Result  Value Ref Range   Specimen Description URINE, CLEAN CATCH    Special Requests      NONE Performed at [facility], [address], [place], [place] [address]    Culture >=100,000 COLONIES/mL ESCHERICHIA COLI (A)    Report Status PENDING   Basic metabolic panel     Status: Abnormal   Collection Time: 10/14/19  5:27 AM  Result Value Ref Range   Sodium 135 135 - 145 mmol/L   Potassium 3.7 3.5 - 5.1 mmol/L   Chloride 97 (L) 98 - 111 mmol/L   CO2 29 22 - 32 mmol/L   Glucose, Bld 107 (H) 70 - 99 mg/dL   BUN 17 8 - 23 mg/dL   Creatinine, Ser 0.98 0.44 - 1.00 mg/dL   Calcium 8.2 (L) 8.9 - 10.3 mg/dL   GFR calc non Af Amer 50 (L) >60 mL/min   GFR calc Af Amer 58 (L) >60 mL/min   Anion gap 9 5 - 15  CBC with Differential/Platelet     Status: Abnormal   Collection Time: 10/14/19  5:27 AM  Result Value Ref Range   WBC 6.9 4.0 - 10.5 K/uL   RBC 3.48 (L)  3.87 - 5.11 MIL/uL   Hemoglobin 10.2 (L) 12.0 - 15.0 g/dL   HCT 31.1 (L) 36 - 46 %   MCV 89.4 80.0 - 100.0 fL   MCH 29.3 26.0 - 34.0 pg   MCHC 32.8 30.0 - 36.0 g/dL   RDW 14.9 11.5 - 15.5 %   Platelets 315 150 - 400 K/uL   nRBC 0.0 0.0 - 0.2 %   Neutrophils Relative % 71 %   Neutro Abs 4.9 1.7 - 7.7 K/uL   Lymphocytes Relative 15 %   Lymphs Abs 1.0 0.7 - 4.0 K/uL   Monocytes Relative 10 %   Monocytes Absolute 0.7 0 - 1 K/uL   Eosinophils Relative 3 %   Eosinophils Absolute 0.2 0 - 0 K/uL   Basophils Relative 1 %   Basophils Absolute 0.0 0 - 0 K/uL   Immature Granulocytes 0 %   Abs Immature Granulocytes 0.03 0.00 - 0.07 K/uL  Magnesium     Status: None   Collection Time: 10/14/19  5:27 AM  Result Value Ref Range   Magnesium 1.9 1.7 - 2.4 mg/dL    Recent Results (from the past 240 hour(s))  SARS Coronavirus 2 by RT PCR (hospital order, performed in [place] hospital lab) Nasopharyngeal Nasopharyngeal Swab     Status: None   Collection Time: 10/12/19  3:50 PM   Specimen: Nasopharyngeal Swab  Result Value Ref Range  Status   SARS Coronavirus 2 NEGATIVE NEGATIVE Final    Comment: (NOTE) SARS-CoV-2 target nucleic acids are NOT DETECTED.  The SARS-CoV-2 RNA is generally detectable in upper and lower respiratory specimens during the acute phase of infection. The lowest concentration of SARS-CoV-2 viral copies this assay can detect is 250 copies / mL. A negative result does not preclude SARS-CoV-2 infection and should not be used as the sole basis for treatment or other patient management decisions.  A negative result may occur with improper specimen collection / handling, submission of specimen other than nasopharyngeal swab, presence of viral mutation(s) within the areas targeted by this assay, and inadequate number of viral copies (<250 copies / mL). A negative result must be combined with clinical observations, patient history, and epidemiological information.  Fact Sheet for Patients:   StrictlyIdeas.no  Fact Sheet for Healthcare Providers: BankingDealers.co.za  This test is not yet approved or  cleared by the [place] FDA and has been authorized for detection and/or diagnosis of SARS-CoV-2 by FDA under an Emergency Use Authorization (EUA).  This EUA will remain in effect (meaning this test can be used) for the duration of the COVID-19 declaration under Section 564(b)(1) of the Act, 21 U.S.C. section 360bbb-3(b)(1), unless the authorization is terminated or revoked sooner.  Performed at [facility], [place] [address], [place], [place] [address]   Urine Culture     Status: Abnormal (Preliminary result)   Collection Time: 10/13/19  3:33 PM   Specimen: Urine, Clean Catch  Result Value Ref Range Status   Specimen Description URINE, CLEAN CATCH  Final   Special Requests   Final    NONE Performed at [facility], [place] [address] [place], [place] [address]    Culture >=100,000 COLONIES/mL ESCHERICHIA COLI (A)  Final   Report Status  PENDING  Incomplete     Radiology Studies: ECHOCARDIOGRAM COMPLETE  Result Date: 10/12/2019    ECHOCARDIOGRAM REPORT   Patient Name:   Shirley Bates Date of Exam: 10/12/2019 Medical Rec #:  [id number]  Height:       61.3 in Accession #:    [id number]    Weight:       112.0 lb Date of Birth:  [birth year]     BSA:          1.481 m Patient Age:    6 years      BP:           131/86 mmHg Patient Gender: F             HR:           136 bpm. Exam Location:  Inpatient Procedure: 2D Echo, Cardiac Doppler and Color Doppler Indications:    Atrial fibrillation  History:        Patient has prior history of Echocardiogram examinations. Risk                 Factors:Dyslipidemia and Hypertension.  Sonographer:    Clayton Lefort RDCS (AE) Referring Phys: [id number] [facility] IMPRESSIONS  1. Left ventricular ejection fraction, by estimation, is 40 to 45%. The left ventricle has mildly decreased function. The left ventricle demonstrates global hypokinesis. There is moderate concentric left ventricular hypertrophy. Left ventricular diastolic function could not be evaluated.  2. Right ventricular systolic function is normal. The right ventricular size is normal. There is moderately elevated pulmonary artery systolic pressure.  3. Left atrial size was moderately dilated.  4. Right atrial size was moderately dilated.  5. The mitral valve is normal in structure. Mild mitral valve regurgitation. No evidence of mitral stenosis.  6. The aortic valve is grossly normal. Aortic valve regurgitation is mild to moderate. Mild aortic valve sclerosis is present, with no evidence of aortic valve stenosis. Comparison(s): Prior images unable to be directly viewed, comparison made by report only. Conclusion(s)/Recommendation(s): In atrial fibrillation with rapid ventricular response (130-140 bpm) throughout study. FINDINGS  Left Ventricle: Left ventricular ejection fraction, by estimation, is 40 to 45%. The left ventricle has mildly decreased  function. The left ventricle demonstrates global hypokinesis. The left ventricular internal cavity size was normal in size. There is  moderate concentric left ventricular hypertrophy. Left ventricular diastolic function could not be evaluated due to atrial fibrillation. Left ventricular diastolic function could not be evaluated. Right Ventricle: The right ventricular size is normal. Right vetricular wall thickness was not assessed. Right ventricular systolic function is normal. There is moderately elevated pulmonary artery systolic pressure. The tricuspid regurgitant velocity is  3.00 m/s, and with an assumed right atrial pressure of 15 mmHg, the estimated right ventricular systolic pressure is 67.6 mmHg. Left Atrium: Left atrial size was moderately dilated. Right Atrium: Right atrial size was moderately dilated. Pericardium: Trivial pericardial effusion is present. Mitral Valve: The mitral valve is normal in structure. There is moderate thickening of the mitral valve leaflet(s). There is moderate calcification of the mitral valve leaflet(s). Mild to moderate mitral annular calcification. Mild mitral valve regurgitation. No evidence of mitral valve stenosis. Tricuspid Valve: The tricuspid valve is normal in structure. Tricuspid valve regurgitation is mild. Aortic Valve: The aortic valve is grossly normal.. There is mild thickening and mild calcification of the aortic valve. Aortic valve regurgitation is mild to moderate. Aortic regurgitation PHT measures 507 msec. Mild aortic valve sclerosis is present, with no evidence of aortic valve stenosis. Mild to moderate aortic valve annular calcification. There is mild thickening of the aortic valve. There is mild calcification of the aortic valve. Aortic valve mean gradient measures 3.4 mmHg. Aortic valve peak  gradient measures 6.2 mmHg. Aortic valve area, by VTI measures 1.69 cm. Pulmonic Valve: The pulmonic valve was not well visualized. Pulmonic valve regurgitation is  not visualized. Aorta: The aortic root and ascending aorta are structurally normal, with no evidence of dilitation. IAS/Shunts: The atrial septum is grossly normal.  LEFT VENTRICLE PLAX 2D LVIDd:         4.00 cm LVIDs:         3.30 cm LV PW:         1.40 cm LV IVS:        1.50 cm LVOT diam:     1.90 cm LV SV:         32 LV SV Index:   22 LVOT Area:     2.84 cm  RIGHT VENTRICLE            IVC RV Basal diam:  3.40 cm    IVC diam: 2.40 cm RV S prime:     8.81 cm/s TAPSE (M-mode): 1.1 cm LEFT ATRIUM           Index       RIGHT ATRIUM           Index LA diam:      4.60 cm 3.11 cm/m  RA Area:     21.30 cm LA Vol (A4C): 73.5 ml 49.63 ml/m RA Volume:   61.20 ml  41.32 ml/m  AORTIC VALVE AV Area (Vmax):    1.65 cm AV Area (Vmean):   1.38 cm AV Area (VTI):     1.69 cm AV Vmax:           124.00 cm/s AV Vmean:          91.000 cm/s AV VTI:            0.191 m AV Peak Grad:      6.2 mmHg AV Mean Grad:      3.4 mmHg LVOT Vmax:         72.06 cm/s LVOT Vmean:        44.380 cm/s LVOT VTI:          0.114 m LVOT/AV VTI ratio: 0.60 AI PHT:            507 msec  AORTA Ao Root diam: 3.60 cm Ao Asc diam:  3.70 cm MR Peak grad:    99.2 mmHg   TRICUSPID VALVE MR Mean grad:    67.0 mmHg   TR Peak grad:   36.0 mmHg MR Vmax:         498.00 cm/s TR Vmax:        300.00 cm/s MR Vmean:        389.0 cm/s MR PISA:         1.01 cm    SHUNTS MR PISA Eff ROA: 8 mm       Systemic VTI:  0.11 m MR PISA Radius:  0.40 cm     Systemic Diam: 1.90 cm Buford Dresser MD Electronically signed by Buford Dresser MD Signature Date/Time: 10/12/2019/10:50:45 PM    Final    DG Chest Port 1 View  Final Result       Scheduled Meds: . apixaban  2.5 mg Oral BID  . aspirin EC  81 mg Oral Daily  . diltiazem  120 mg Oral Daily  . ferrous sulfate  325 mg Oral Q breakfast  . [START ON 10/15/2019] furosemide  40 mg Oral Daily  . loratadine  10 mg Oral Daily  .  losartan  25 mg Oral Daily  . metoprolol tartrate  25 mg Oral BID   PRN Meds:  acetaminophen, guaiFENesin-dextromethorphan, ipratropium-albuterol, LORazepam, metoprolol tartrate, ondansetron (ZOFRAN) IV Continuous Infusions:     LOS: 2 days  Time spent: Greater than 50% of the 35 minute visit was spent in counseling/coordination of care for the patient as laid out in the A&P.   Dwyane Dee, MD [facility] 10/14/2019, 1:40 PM   Contact via secure chat.  To contact the attending provider between 7A-7P or the covering provider during after hours 7P-7A, please log into the web site [email or web address] and access using universal [place] password for that web site. If you do not have the password, please call the hospital operator.

## 2019-10-14 NOTE — Evaluation (Signed)
Occupational Therapy Evaluation Patient Details Name: Shirley Bates MRN: [id number] DOB: [birth year] Today's Date: 10/14/2019    History of Present Illness 86 yo female with onset of a-fib with RVR was admitted, noted pt in CHF w fluid overload.  Suspected of having UTI, LVEF was 40-45% yesterday.  PMHx:  dementia, UTI, e-coli, HTN, ALF resident   Clinical Impression   PTA Pt lives at ALF and was mod I for mobility with Rollator or SPC (most recently has been using Rollator more). She gets assist from ALF staff for daily bathing/dressing. Today she was eager to participate in therapy. On 2L O2 throughout session (she did desaturate to mid 80's on RA). HR went from 109-low 120's with one very brief spike to 145. BP WFL throughout session. Pt overall min guard for all aspects of mobility with RW and standing grooming/toilet transfer/peri care. Dramatic improvement from previous session with PT. At this time pending ALF approval I think she can return with more frequent checks for safety but suspect with more opportunity for activity she is getting close to baseline. OT will continue to follow acutely to focus on activity tolerance, balance, ADL, and functional transfers.    Follow Up Recommendations  Supervision/Assistance - 24 hour (Return to ALF)    Equipment Recommendations  None recommended by OT    Recommendations for Other Services       Precautions / Restrictions Precautions Precautions: Fall Precaution Comments: monitor HR and BP Restrictions Weight Bearing Restrictions: No      Mobility Bed Mobility Overal bed mobility: Needs Assistance Bed Mobility: Supine to Sit     Supine to sit: Min assist     General bed mobility comments: min A for trunk elevation, increased time, Pt able to scoot hips out  Transfers Overall transfer level: Needs assistance Equipment used: Rolling walker (2 wheeled) Transfers: Sit to/from [facility] Sit to Stand: Min  guard Stand pivot transfers: Min guard       General transfer comment: min guad for safety, no physical assist needed multiple transfers from bed, toilet,    Balance Overall balance assessment: Needs assistance Sitting-balance support: Feet supported Sitting balance-Leahy Scale: Good     Standing balance support: No upper extremity supported;Bilateral upper extremity supported;During functional activity Standing balance-Leahy Scale: Fair Standing balance comment: able to static stand at sink and wash hands                           ADL either performed or assessed with clinical judgement   ADL Overall ADL's : Needs assistance/impaired Eating/Feeding: Set up;Sitting   Grooming: Wash/dry hands;Min guard;Standing Grooming Details (indicate cue type and reason): at sink Upper Body Bathing: Minimal assistance;Sitting   Lower Body Bathing: Moderate assistance;Sitting/lateral leans   Upper Body Dressing : Minimal assistance;Sitting   Lower Body Dressing: Minimal assistance;Sitting/lateral leans   Toilet Transfer: Min guard;Ambulation;RW Toilet Transfer Details (indicate cue type and reason): vc for safety in tight space Toileting- Clothing Manipulation and Hygiene: Set up;Sit to/from stand       Functional mobility during ADLs: Min guard;Rolling walker General ADL Comments: suspect baseline or close to it     Vision Patient Visual Report: No change from baseline Vision Assessment?: No apparent visual deficits     Perception     Praxis      Pertinent Vitals/Pain Pain Assessment: No/denies pain     Hand Dominance Right   Extremity/Trunk Assessment Upper Extremity Assessment Upper Extremity  Assessment: Generalized weakness   Lower Extremity Assessment Lower Extremity Assessment: Generalized weakness   Cervical / Trunk Assessment Cervical / Trunk Assessment: Kyphotic   Communication Communication Communication: HOH   Cognition Arousal/Alertness:  Awake/alert Behavior During Therapy: WFL for tasks assessed/performed Overall Cognitive Status: History of cognitive impairments - at baseline                                 General Comments: pleasant, conversive, funny, repeats herself, requires multiple cues   General Comments  VSS throughout session, Pt on 2LO2 throughout session, does drop to mid 80's without supplemental O2    Exercises     Shoulder Instructions      Home Living Family/patient expects to be discharged to:: Assisted living Living Arrangements: Other (Comment) (ALF)                           Home Equipment: Shower seat - built in;Grab bars - toilet;Grab bars - tub/shower;Hand held shower head;Walker - 4 wheels          Prior Functioning/Environment Level of Independence: Needs assistance  Gait / Transfers Assistance Needed: SPC or rollator with mod I ADL's / Homemaking Assistance Needed: has ALF staff to assist bathing and dressing            OT Problem List: Decreased activity tolerance;Impaired balance (sitting and/or standing);Decreased safety awareness;Cardiopulmonary status limiting activity      OT Treatment/Interventions: Self-care/ADL training;Therapeutic exercise;Therapeutic activities;Patient/family education;Balance training    OT Goals(Current goals can be found in the care plan section) Acute Rehab OT Goals Patient Stated Goal: get back home and keep moving OT Goal Formulation: With patient Time For Goal Achievement: 10/28/19 Potential to Achieve Goals: Good ADL Goals Pt Will Perform Grooming: with supervision;standing Pt Will Transfer to Toilet: with supervision;ambulating Pt Will Perform Toileting - Clothing Manipulation and hygiene: with modified independence;sit to/from stand Pt/caregiver will Perform Home Exercise Program: Both right and left upper extremity;Increased strength;With Supervision  OT Frequency: Min 2X/week   Barriers to D/C:             Co-evaluation              AM-PAC OT "6 Clicks" Daily Activity     Outcome Measure Help from another person eating meals?: A Little Help from another person taking care of personal grooming?: A Little Help from another person toileting, which includes using toliet, bedpan, or urinal?: A Little Help from another person bathing (including washing, rinsing, drying)?: A Little Help from another person to put on and taking off regular upper body clothing?: A Little Help from another person to put on and taking off regular lower body clothing?: A Little 6 Click Score: 18   End of Session Equipment Utilized During Treatment: Gait belt;Rolling walker;Oxygen (2L) Nurse Communication: Mobility status  Activity Tolerance: Patient tolerated treatment well Patient left: in bed;with call bell/phone within reach;with family/visitor present (sitting EOB)  OT Visit Diagnosis: Unsteadiness on feet (R26.81);Other abnormalities of gait and mobility (R26.89);Muscle weakness (generalized) (M62.81);Other symptoms and signs involving cognitive function                Time: 4332-9518 OT Time Calculation (min): 31 min Charges:  OT General Charges $OT Visit: 1 Visit OT Evaluation $OT Eval Moderate Complexity: 1 Mod OT Treatments $Therapeutic Activity: 8-22 mins  Jesse Sans OTR/L Acute Rehabilitation Services Pager: [phone] Office: [phone]  [place] 10/14/2019, 5:02 PM

## 2019-10-14 NOTE — Progress Notes (Signed)
Inpatient Rehab Admissions Coordinator Note:   Per PT recommendation, pt was screened for CIR candidacy by Gayland Curry, MS, CCC-SLP.  At this time we are recommending an Inpatient Rehab Consult.  AC will place consult order per protocol.  Please contact me with questions.     Gayland Curry, [place], [place] Admissions Coordinator [phone] 10/14/19 11:47 AM

## 2019-10-14 NOTE — Assessment & Plan Note (Addendum)
-   patient lives in ALF in memory unit; PT has recommended CIR - daughter is considering CIR but also wishes to discuss options with her ALF - continue working with PT  - after discussions, plan will be for d/c to [facility] ALF with PT in place once she is medically stable

## 2019-10-14 NOTE — Progress Notes (Signed)
DAILY PROGRESS NOTE   Patient Name: Shirley Bates Date of Encounter: 10/14/2019 Cardiologist: Glenetta Hew, MD  Chief Complaint   No complaints  Patient Profile   Shirley Bates is a 86 y.o. female with a history of dementia, hypertension, hyperlipidemia, osteoporosis, and recurrent UTI who is being seen today for the evaluation of atrial fibrillation with RVR at the request of Dr. Doristine Bosworth.  Subjective   Diuresed another 600 cc, creatinine stable. Less confused today -off diltiazem, but rates not well-controlled today.  Objective   Vitals:   10/13/19 2018 10/13/19 2349 10/14/19 0407 10/14/19 0730  BP: 102/73 125/79 106/83   Pulse: 92 93 (!) 119   Resp: 20 20 17 20   Temp: 98.3 F (36.8 C) 97.9 F (36.6 C) 97.6 F (36.4 C)   TempSrc: Oral Axillary Axillary   SpO2: 96% 97% 95%   Weight:   57.6 kg     Intake/Output Summary (Last 24 hours) at 10/14/2019 2355 Last data filed at 10/14/2019 0405 Gross per 24 hour  Intake --  Output 600 ml  Net -600 ml   Filed Weights   10/13/19 0438 10/14/19 0407  Weight: 56 kg 57.6 kg    Physical Exam   General appearance: alert, appears stated age, no distress and pale Neck: JVD - 1 cm above sternal notch, no carotid bruit and thyroid not enlarged, symmetric, no tenderness/mass/nodules Lungs: diminished breath sounds bilaterally Heart: irregularly irregular rhythm Abdomen: soft, non-tender; bowel sounds normal; no masses,  no organomegaly Extremities: extremities normal, atraumatic, no cyanosis or edema Pulses: 2+ and symmetric Skin: pale, warm, dry Neurologic: Mental status: Alert, oriented, thought content appropriate Psych: More oriented today  Inpatient Medications    Scheduled Meds: . apixaban  2.5 mg Oral BID  . aspirin EC  81 mg Oral Daily  . diltiazem  10 mg Intravenous Once  . ferrous sulfate  325 mg Oral Q breakfast  . furosemide  40 mg Intravenous Q12H  . losartan  25 mg Oral Daily  . metoprolol tartrate  25  mg Oral BID    Continuous Infusions: . diltiazem (CARDIZEM) infusion Stopped (10/13/19 1825)    PRN Meds: acetaminophen, guaiFENesin-dextromethorphan, ipratropium-albuterol, LORazepam, metoprolol tartrate, ondansetron (ZOFRAN) IV   Labs   Results for orders placed or performed during the hospital encounter of 10/12/19 (from the past 48 hour(s))  CBC     Status: Abnormal   Collection Time: 10/12/19  1:05 PM  Result Value Ref Range   WBC 6.1 4.0 - 10.5 K/uL   RBC 3.49 (L) 3.87 - 5.11 MIL/uL   Hemoglobin 10.6 (L) 12.0 - 15.0 g/dL   HCT 32.2 (L) 36 - 46 %   MCV 92.3 80.0 - 100.0 fL   MCH 30.4 26.0 - 34.0 pg   MCHC 32.9 30.0 - 36.0 g/dL   RDW 15.5 11.5 - 15.5 %   Platelets 333 150 - 400 K/uL   nRBC 0.0 0.0 - 0.2 %    Comment: Performed at [facility], [place] [address] [place],  [address]  Comprehensive metabolic panel     Status: Abnormal   Collection Time: 10/12/19  1:05 PM  Result Value Ref Range   Sodium 134 (L) 135 - 145 mmol/L   Potassium 4.6 3.5 - 5.1 mmol/L   Chloride 102 98 - 111 mmol/L   CO2 21 (L) 22 - 32 mmol/L   Glucose, Bld 109 (H) 70 - 99 mg/dL    Comment: Glucose reference range applies  only to samples taken after fasting for at least 8 hours.   BUN 19 8 - 23 mg/dL   Creatinine, Ser 0.81 0.44 - 1.00 mg/dL   Calcium 8.7 (L) 8.9 - 10.3 mg/dL   Total Protein 6.4 (L) 6.5 - 8.1 g/dL   Albumin 2.9 (L) 3.5 - 5.0 g/dL   AST 37 15 - 41 U/L   ALT 36 0 - 44 U/L   Alkaline Phosphatase 75 38 - 126 U/L   Total Bilirubin 0.6 0.3 - 1.2 mg/dL   GFR calc non Af Amer >60 >60 mL/min   GFR calc Af Amer >60 >60 mL/min   Anion gap 11 5 - 15    Comment: Performed at [facility] [address], [place], [place] [address]  Troponin I (High Sensitivity)     Status: Abnormal   Collection Time: 10/12/19  1:05 PM  Result Value Ref Range   Troponin I (High Sensitivity) 20 (H) <18 ng/L    Comment: (NOTE) Elevated high sensitivity troponin I (hsTnI) values and  significant  changes across serial measurements may suggest ACS but many other  chronic and acute conditions are known to elevate hsTnI results.  Refer to the "Links" section for chest pain algorithms and additional  guidance. Performed at [facility], [place] [address] [place], [place] [address]   TSH     Status: None   Collection Time: 10/12/19  1:05 PM  Result Value Ref Range   TSH 1.279 0.350 - 4.500 uIU/mL    Comment: Performed by a 3rd Generation assay with a functional sensitivity of <=0.01 uIU/mL. Performed at [facility], [place] [address], [place], [place] [address]   Urinalysis, Routine w reflex microscopic     Status: Abnormal   Collection Time: 10/12/19  3:28 PM  Result Value Ref Range   Color, Urine AMBER (A) YELLOW    Comment: BIOCHEMICALS MAY BE AFFECTED BY COLOR   APPearance CLOUDY (A) CLEAR   Specific Gravity, Urine 1.021 1.005 - 1.030   pH 5.0 5.0 - 8.0   Glucose, UA NEGATIVE NEGATIVE mg/dL   Hgb urine dipstick NEGATIVE NEGATIVE   Bilirubin Urine NEGATIVE NEGATIVE   Ketones, ur NEGATIVE NEGATIVE mg/dL   Protein, ur 30 (A) NEGATIVE mg/dL   Nitrite NEGATIVE NEGATIVE   Leukocytes,Ua LARGE (A) NEGATIVE   RBC / HPF 6-10 0 - 5 RBC/hpf   WBC, UA >50 (H) 0 - 5 WBC/hpf   Bacteria, UA MANY (A) NONE SEEN   Squamous Epithelial / LPF 6-10 0 - 5   Mucus PRESENT     Comment: Performed at [facility], [address], [place], [place] [address]  Brain natriuretic peptide     Status: Abnormal   Collection Time: 10/12/19  3:50 PM  Result Value Ref Range   B Natriuretic Peptide 838.8 (H) 0.0 - 100.0 pg/mL    Comment: Performed at [facility] [address], [place], [place] [address]  SARS Coronavirus 2 by RT PCR (hospital order, performed in [facility] hospital lab) Nasopharyngeal Nasopharyngeal Swab     Status: None   Collection Time: 10/12/19  3:50 PM   Specimen: Nasopharyngeal Swab  Result Value Ref Range   SARS Coronavirus 2 NEGATIVE NEGATIVE      Comment: (NOTE) SARS-CoV-2 target nucleic acids are NOT DETECTED.  The SARS-CoV-2 RNA is generally detectable in upper and lower respiratory specimens during the acute phase of infection. The lowest concentration of  SARS-CoV-2 viral copies this assay can detect is 250 copies / mL. A negative result does not preclude SARS-CoV-2 infection and should not be used as the sole basis for treatment or other patient management decisions.  A negative result may occur with improper specimen collection / handling, submission of specimen other than nasopharyngeal swab, presence of viral mutation(s) within the areas targeted by this assay, and inadequate number of viral copies (<250 copies / mL). A negative result must be combined with clinical observations, patient history, and epidemiological information.  Fact Sheet for Patients:   StrictlyIdeas.no  Fact Sheet for Healthcare Providers: BankingDealers.co.za  This test is not yet approved or  cleared by the [place] FDA and has been authorized for detection and/or diagnosis of SARS-CoV-2 by FDA under an Emergency Use Authorization (EUA).  This EUA will remain in effect (meaning this test can be used) for the duration of the COVID-19 declaration under Section 564(b)(1) of the Act, 21 U.S.C. section 360bbb-3(b)(1), unless the authorization is terminated or revoked sooner.  Performed at [facility], [place] [address] [place], [place] [address]   Troponin I (High Sensitivity)     Status: Abnormal   Collection Time: 10/12/19  3:50 PM  Result Value Ref Range   Troponin I (High Sensitivity) 22 (H) <18 ng/L    Comment: (NOTE) Elevated high sensitivity troponin I (hsTnI) values and significant  changes across serial measurements may suggest ACS but many other  chronic and acute conditions are known to elevate hsTnI results.  Refer to the "Links" section for chest pain algorithms and  additional  guidance. Performed at [facility], [place] [address], [place], [place] [address]   Magnesium     Status: None   Collection Time: 10/12/19  3:50 PM  Result Value Ref Range   Magnesium 2.0 1.7 - 2.4 mg/dL    Comment: Performed at [facility], [place] [address], [place], [place] [address]  Iron and TIBC     Status: Abnormal   Collection Time: 10/12/19  3:50 PM  Result Value Ref Range   Iron 30 28 - 170 ug/dL   TIBC 330 250 - 450 ug/dL   Saturation Ratios 9 (L) 10.4 - 31.8 %   UIBC 300 ug/dL    Comment: Performed at [facility], [place] [address], [place], [place] [address]  Ferritin     Status: None   Collection Time: 10/12/19  3:50 PM  Result Value Ref Range   Ferritin 47 11 - 307 ng/mL    Comment: Performed at [facility], [place] [address], [place], [place] [address]  Basic metabolic panel     Status: Abnormal   Collection Time: 10/13/19 11:56 AM  Result Value Ref Range   Sodium 135 135 - 145 mmol/L   Potassium 3.8 3.5 - 5.1 mmol/L   Chloride 98 98 - 111 mmol/L   CO2 26 22 - 32 mmol/L   Glucose, Bld 153 (H) 70 - 99 mg/dL    Comment: Glucose reference range applies only to samples taken after fasting for at least 8 hours.   BUN 17 8 - 23 mg/dL   Creatinine, Ser 0.97 0.44 - 1.00 mg/dL   Calcium 8.4 (L) 8.9 - 10.3 mg/dL   GFR calc non Af Amer 51 (L) >60 mL/min   GFR calc Af Amer 59 (L) >60 mL/min   Anion gap 11 5 - 15    Comment: Performed at [facility], [address], [place], [place] [address]  CBC     Status: Abnormal   Collection Time: 10/13/19 11:56 AM  Result Value Ref Range   WBC 6.6 4.0 - 10.5 K/uL   RBC 3.66 (L) 3.87 - 5.11 MIL/uL   Hemoglobin 11.0 (L) 12.0 - 15.0 g/dL   HCT 33.1 (L) 36 - 46 %   MCV 90.4 80.0 - 100.0 fL   MCH 30.1 26.0 - 34.0 pg   MCHC 33.2 30.0 - 36.0 g/dL   RDW 15.1 11.5 - 15.5 %   Platelets 307 150 - 400 K/uL   nRBC 0.0 0.0 - 0.2 %    Comment: Performed at [facility], [place] [address],  [place], [place] [address]  Lipid panel     Status: None   Collection Time: 10/13/19 11:56 AM  Result Value Ref Range   Cholesterol 118 0 - 200 mg/dL   Triglycerides 46 <150 mg/dL   HDL 43 >40 mg/dL   Total CHOL/HDL Ratio 2.7 RATIO   VLDL 9 0 - 40 mg/dL   LDL Cholesterol 66 0 - 99 mg/dL    Comment:        Total Cholesterol/HDL:CHD Risk Coronary Heart Disease Risk Table                     Men   Women  1/2 Average Risk   3.4   3.3  Average Risk       5.0   4.4  2 X Average Risk   9.6   7.1  3 X Average Risk  23.4   11.0        Use the calculated Patient Ratio above and the CHD Risk Table to determine the patient's CHD Risk.        ATP III CLASSIFICATION (LDL):  <100     mg/dL   Optimal  100-129  mg/dL   Near or Above                    Optimal  130-159  mg/dL   Borderline  160-189  mg/dL   High  >190     mg/dL   Very High Performed at [facility] [address], [place], [place] [address]   Basic metabolic panel     Status: Abnormal   Collection Time: 10/14/19  5:27 AM  Result Value Ref Range   Sodium 135 135 - 145 mmol/L   Potassium 3.7 3.5 - 5.1 mmol/L   Chloride 97 (L) 98 - 111 mmol/L   CO2 29 22 - 32 mmol/L   Glucose, Bld 107 (H) 70 - 99 mg/dL    Comment: Glucose reference range applies only to samples taken after fasting for at least 8 hours.   BUN 17 8 - 23 mg/dL   Creatinine, Ser 0.98 0.44 - 1.00 mg/dL   Calcium 8.2 (L) 8.9 - 10.3 mg/dL   GFR calc non Af Amer 50 (L) >60 mL/min   GFR calc Af Amer 58 (L) >60 mL/min   Anion gap 9 5 - 15    Comment: Performed at [facility] [address], [place], [place] [address]  CBC with Differential/Platelet     Status: Abnormal   Collection Time: 10/14/19  5:27 AM  Result Value Ref Range   WBC 6.9 4.0 - 10.5 K/uL   RBC 3.48 (L) 3.87 - 5.11 MIL/uL   Hemoglobin 10.2 (L) 12.0 - 15.0 g/dL  HCT 31.1 (L) 36 - 46 %   MCV 89.4 80.0 - 100.0 fL   MCH 29.3 26.0 - 34.0 pg   MCHC 32.8 30.0 - 36.0 g/dL   RDW 14.9 11.5 -  15.5 %   Platelets 315 150 - 400 K/uL   nRBC 0.0 0.0 - 0.2 %   Neutrophils Relative % 71 %   Neutro Abs 4.9 1.7 - 7.7 K/uL   Lymphocytes Relative 15 %   Lymphs Abs 1.0 0.7 - 4.0 K/uL   Monocytes Relative 10 %   Monocytes Absolute 0.7 0 - 1 K/uL   Eosinophils Relative 3 %   Eosinophils Absolute 0.2 0 - 0 K/uL   Basophils Relative 1 %   Basophils Absolute 0.0 0 - 0 K/uL   Immature Granulocytes 0 %   Abs Immature Granulocytes 0.03 0.00 - 0.07 K/uL    Comment: Performed at [facility] [address], [place], [place] [address]  Magnesium     Status: None   Collection Time: 10/14/19  5:27 AM  Result Value Ref Range   Magnesium 1.9 1.7 - 2.4 mg/dL    Comment: Performed at [facility] [address], [place], [place] [address]    ECG   N/A  Telemetry    Afib 80-100, PVC's- Personally Reviewed  Radiology    DG Chest Port 1 View  Result Date: 10/12/2019 CLINICAL DATA:  Shortness of breath. Atrial fibrillation with rapid ventricular response. EXAM: PORTABLE CHEST 1 VIEW COMPARISON:  None. FINDINGS: 1321 hours. The heart is enlarged. There is aortic atherosclerosis and mild pulmonary edema. Right-greater-than-left basilar airspace opacities likely represent atelectasis. There are probable small bilateral pleural effusions. No pneumothorax. No acute osseous findings are evident. There are degenerative changes in the spine associated with a convex right thoracic scoliosis. Old fracture of the distal right clavicle noted. Telemetry leads overlie the chest. IMPRESSION: Cardiomegaly with mild pulmonary edema and probable small bilateral pleural effusions consistent with congestive heart failure. Electronically Signed   By: Richardean Sale M.D.   On: 10/12/2019 13:43   ECHOCARDIOGRAM COMPLETE  Result Date: 10/12/2019    ECHOCARDIOGRAM REPORT   Patient Name:   Shirley Bates Date of Exam: 10/12/2019 Medical Rec #:  [id number]     Height:       61.3 in Accession #:    [id number]     Weight:       112.0 lb Date of Birth:  01/14/1926     BSA:          1.481 m Patient Age:    53 years      BP:           131/86 mmHg Patient Gender: F             HR:           136 bpm. Exam Location:  Inpatient Procedure: 2D Echo, Cardiac Doppler and Color Doppler Indications:    Atrial fibrillation  History:        Patient has prior history of Echocardiogram examinations. Risk                 Factors:Dyslipidemia and Hypertension.  Sonographer:    Clayton Lefort RDCS (AE) Referring Phys: [id number] [facility] IMPRESSIONS  1. Left ventricular ejection fraction, by estimation, is 40 to 45%. The left ventricle has mildly decreased function. The left ventricle demonstrates global hypokinesis. There is moderate concentric left ventricular hypertrophy. Left ventricular diastolic function  could not be evaluated.  2. Right ventricular systolic function is normal. The right ventricular size is normal. There is moderately elevated pulmonary artery systolic pressure.  3. Left atrial size was moderately dilated.  4. Right atrial size was moderately dilated.  5. The mitral valve is normal in structure. Mild mitral valve regurgitation. No evidence of mitral stenosis.  6. The aortic valve is grossly normal. Aortic valve regurgitation is mild to moderate. Mild aortic valve sclerosis is present, with no evidence of aortic valve stenosis. Comparison(s): Prior images unable to be directly viewed, comparison made by report only. Conclusion(s)/Recommendation(s): In atrial fibrillation with rapid ventricular response (130-140 bpm) throughout study. FINDINGS  Left Ventricle: Left ventricular ejection fraction, by estimation, is 40 to 45%. The left ventricle has mildly decreased function. The left ventricle demonstrates global hypokinesis. The left ventricular internal cavity size was normal in size. There is  moderate concentric left ventricular hypertrophy. Left ventricular diastolic function could not be evaluated due to atrial  fibrillation. Left ventricular diastolic function could not be evaluated. Right Ventricle: The right ventricular size is normal. Right vetricular wall thickness was not assessed. Right ventricular systolic function is normal. There is moderately elevated pulmonary artery systolic pressure. The tricuspid regurgitant velocity is  3.00 m/s, and with an assumed right atrial pressure of 15 mmHg, the estimated right ventricular systolic pressure is 16.3 mmHg. Left Atrium: Left atrial size was moderately dilated. Right Atrium: Right atrial size was moderately dilated. Pericardium: Trivial pericardial effusion is present. Mitral Valve: The mitral valve is normal in structure. There is moderate thickening of the mitral valve leaflet(s). There is moderate calcification of the mitral valve leaflet(s). Mild to moderate mitral annular calcification. Mild mitral valve regurgitation. No evidence of mitral valve stenosis. Tricuspid Valve: The tricuspid valve is normal in structure. Tricuspid valve regurgitation is mild. Aortic Valve: The aortic valve is grossly normal.. There is mild thickening and mild calcification of the aortic valve. Aortic valve regurgitation is mild to moderate. Aortic regurgitation PHT measures 507 msec. Mild aortic valve sclerosis is present, with no evidence of aortic valve stenosis. Mild to moderate aortic valve annular calcification. There is mild thickening of the aortic valve. There is mild calcification of the aortic valve. Aortic valve mean gradient measures 3.4 mmHg. Aortic valve peak  gradient measures 6.2 mmHg. Aortic valve area, by VTI measures 1.69 cm. Pulmonic Valve: The pulmonic valve was not well visualized. Pulmonic valve regurgitation is not visualized. Aorta: The aortic root and ascending aorta are structurally normal, with no evidence of dilitation. IAS/Shunts: The atrial septum is grossly normal.  LEFT VENTRICLE PLAX 2D LVIDd:         4.00 cm LVIDs:         3.30 cm LV PW:         1.40  cm LV IVS:        1.50 cm LVOT diam:     1.90 cm LV SV:         32 LV SV Index:   22 LVOT Area:     2.84 cm  RIGHT VENTRICLE            IVC RV Basal diam:  3.40 cm    IVC diam: 2.40 cm RV S prime:     8.81 cm/s TAPSE (M-mode): 1.1 cm LEFT ATRIUM           Index       RIGHT ATRIUM           Index LA  diam:      4.60 cm 3.11 cm/m  RA Area:     21.30 cm LA Vol (A4C): 73.5 ml 49.63 ml/m RA Volume:   61.20 ml  41.32 ml/m  AORTIC VALVE AV Area (Vmax):    1.65 cm AV Area (Vmean):   1.38 cm AV Area (VTI):     1.69 cm AV Vmax:           124.00 cm/s AV Vmean:          91.000 cm/s AV VTI:            0.191 m AV Peak Grad:      6.2 mmHg AV Mean Grad:      3.4 mmHg LVOT Vmax:         72.06 cm/s LVOT Vmean:        44.380 cm/s LVOT VTI:          0.114 m LVOT/AV VTI ratio: 0.60 AI PHT:            507 msec  AORTA Ao Root diam: 3.60 cm Ao Asc diam:  3.70 cm MR Peak grad:    99.2 mmHg   TRICUSPID VALVE MR Mean grad:    67.0 mmHg   TR Peak grad:   36.0 mmHg MR Vmax:         498.00 cm/s TR Vmax:        300.00 cm/s MR Vmean:        389.0 cm/s MR PISA:         1.01 cm    SHUNTS MR PISA Eff ROA: 8 mm       Systemic VTI:  0.11 m MR PISA Radius:  0.40 cm     Systemic Diam: 1.90 cm Buford Dresser MD Electronically signed by Buford Dresser MD Signature Date/Time: 10/12/2019/10:50:45 PM    Final     Cardiac Studies   See echo above  Assessment   Principal Problem:   Atrial fibrillation with RVR (HCC) Active Problems:   Hyperlipidemia   Hypertension   Fluid overload   Normocytic anemia   Bacteriuria   Plan   1. Appears near euvolemic -will switch to oral lasix 40 mg daily tomorrow (received IV this am). Urine culture pending - ?colonized with e. Coli. HR not as optimal off diltiazem. Although CCB is less preferred with CHF, rate control was better - bp soft, but should tolerate - start cardizem LA 120 mg daily. Continue metoprolol. More alert today - updated daughter at the bedside.  Time Spent  Directly with Patient:  I have spent a total of 25 minutes with the patient reviewing hospital notes, telemetry, EKGs, labs and examining the patient as well as establishing an assessment and plan that was discussed personally with the patient.  > 50% of time was spent in direct patient care.  Length of Stay:  LOS: 2 days   Pixie Casino, MD, [facility], [place] Director of the Advanced Lipid Disorders &  Cardiovascular Risk Reduction Clinic Diplomate of the American Board of Clinical Lipidology Attending Cardiologist  Direct Dial: [phone]  Fax: [phone]  Website:  [email or web address] 10/14/2019, 8:34 AM

## 2019-10-14 NOTE — Progress Notes (Signed)
Inpatient Rehab Admissions:  Inpatient Rehab Consult received.  I met with patient and daughter, Shirley Bates at the bedside for rehabilitation assessment and to discuss goals and expectations of an inpatient rehab admission.  Daughter appears interested in CIR and acknowledged understanding of CIR goals and expectations.  Pt appears to be an appropriate candidate for potential admission to CIR.  However, daughter indicated that she is not aware of the assistance level pt's facility (ALF) will accept in order for her to be able to return after d/c from hospital.  Daughter mentioned that she plans on contacting facility to gather that information and then will call [facility].  Discussed with daughter the possibility of pt receiving therapy at another type of facility if ALF's assistance level is not an achieveable goal at CIR.  She acknowledged understanding of this as well.  Will continue to monitor pt's progress with acute therapies and medical workup.  Will also await call from daughter regarding ALF assistance requirements.  Signed: Gayland Curry, [place], [place] Admissions Coordinator [phone]

## 2019-10-15 DIAGNOSIS — E8770 Fluid overload, unspecified: Secondary | ICD-10-CM | POA: Diagnosis not present

## 2019-10-15 DIAGNOSIS — I4891 Unspecified atrial fibrillation: Secondary | ICD-10-CM | POA: Diagnosis not present

## 2019-10-15 DIAGNOSIS — I10 Essential (primary) hypertension: Secondary | ICD-10-CM | POA: Diagnosis not present

## 2019-10-15 LAB — CBC WITH DIFFERENTIAL/PLATELET
Abs Immature Granulocytes: 0.02 10*3/uL (ref 0.00–0.07)
Basophils Absolute: 0.1 10*3/uL (ref 0.0–0.1)
Basophils Relative: 1 %
Eosinophils Absolute: 0.3 10*3/uL (ref 0.0–0.5)
Eosinophils Relative: 4 %
HCT: 32.1 % — ABNORMAL LOW (ref 36.0–46.0)
Hemoglobin: 10.7 g/dL — ABNORMAL LOW (ref 12.0–15.0)
Immature Granulocytes: 0 %
Lymphocytes Relative: 17 %
Lymphs Abs: 1.1 10*3/uL (ref 0.7–4.0)
MCH: 30 pg (ref 26.0–34.0)
MCHC: 33.3 g/dL (ref 30.0–36.0)
MCV: 89.9 fL (ref 80.0–100.0)
Monocytes Absolute: 0.7 10*3/uL (ref 0.1–1.0)
Monocytes Relative: 11 %
Neutro Abs: 4.2 10*3/uL (ref 1.7–7.7)
Neutrophils Relative %: 67 %
Platelets: 329 10*3/uL (ref 150–400)
RBC: 3.57 MIL/uL — ABNORMAL LOW (ref 3.87–5.11)
RDW: 14.8 % (ref 11.5–15.5)
WBC: 6.3 10*3/uL (ref 4.0–10.5)
nRBC: 0 % (ref 0.0–0.2)

## 2019-10-15 LAB — URINE CULTURE: Culture: >=100000 — AB

## 2019-10-15 LAB — BASIC METABOLIC PANEL
Anion gap: 8 (ref 5–15)
BUN: 19 mg/dL (ref 8–23)
CO2: 29 mmol/L (ref 22–32)
Calcium: 8.2 mg/dL — ABNORMAL LOW (ref 8.9–10.3)
Chloride: 95 mmol/L — ABNORMAL LOW (ref 98–111)
Creatinine, Ser: 0.82 mg/dL (ref 0.44–1.00)
GFR calc Af Amer: >60 mL/min (ref >60)
GFR calc non Af Amer: >60 mL/min (ref >60)
Glucose, Bld: 101 mg/dL — ABNORMAL HIGH (ref 70–99)
Potassium: 3.8 mmol/L (ref 3.5–5.1)
Sodium: 132 mmol/L — ABNORMAL LOW (ref 135–145)

## 2019-10-15 LAB — MAGNESIUM: Magnesium: 1.9 mg/dL (ref 1.7–2.4)

## 2019-10-15 LAB — PROTIME-INR
INR: 1.2 (ref 0.8–1.2)
Prothrombin Time: 14.8 seconds (ref 11.4–15.2)

## 2019-10-15 LAB — MRSA PCR SCREENING: MRSA by PCR: POSITIVE — AB

## 2019-10-15 MED ORDER — MUPIROCIN 2 % EX OINT
1.0000 "application " | TOPICAL_OINTMENT | Freq: Two times a day (BID) | CUTANEOUS | Status: DC
  Administered 2019-10-15 – 2019-10-17 (×3): 1 via NASAL
  Filled 2019-10-15 (×2): qty 22

## 2019-10-15 MED ORDER — FUROSEMIDE 20 MG PO TABS
20.0000 mg | ORAL_TABLET | Freq: Every day | ORAL | Status: DC
  Administered 2019-10-16 – 2019-10-17 (×2): 20 mg via ORAL
  Filled 2019-10-15 (×2): qty 1

## 2019-10-15 MED ORDER — SODIUM CHLORIDE 0.9 % IV SOLN: INTRAVENOUS | Status: DC

## 2019-10-15 MED ORDER — CHLORHEXIDINE GLUCONATE CLOTH 2 % EX PADS
6.0000 | MEDICATED_PAD | Freq: Every day | CUTANEOUS | Status: DC
  Administered 2019-10-16 – 2019-10-17 (×2): 6 via TOPICAL

## 2019-10-15 NOTE — H&P (View-Only) (Signed)
Progress Note  Patient Name: Shirley Bates Date of Encounter: 10/15/2019  [facility] Cardiologist: Glenetta Hew, MD   Subjective   Pt HR in the 140-150s this morning at shift change, received 5 mg IV lopressor. I was paged that pressure is now 94/73, asymptomatic.  Inpatient Medications    Scheduled Meds:  apixaban  2.5 mg Oral BID   aspirin EC  81 mg Oral Daily   diltiazem  120 mg Oral Daily   ferrous sulfate  325 mg Oral Q breakfast   furosemide  40 mg Oral Daily   loratadine  10 mg Oral Daily   losartan  25 mg Oral Daily   metoprolol tartrate  25 mg Oral BID   Continuous Infusions:   PRN Meds: acetaminophen, guaiFENesin-dextromethorphan, ipratropium-albuterol, LORazepam, metoprolol tartrate, ondansetron (ZOFRAN) IV   Vital Signs    Vitals:   10/14/19 1934 10/14/19 2207 10/14/19 2334 10/15/19 0334  BP: 122/72 122/85 (!) 128/95 (!) 118/89  Pulse: 98 (!) 108 96 92  Resp: 21  17 15   Temp: 98.1 F (36.7 C)  98 F (36.7 C) 98.2 F (36.8 C)  TempSrc: Oral  Oral Oral  SpO2: 95%  96% 96%  Weight:    57.3 kg    Intake/Output Summary (Last 24 hours) at 10/15/2019 0700 Last data filed at 10/15/2019 0156 Gross per 24 hour  Intake --  Output 1250 ml  Net -1250 ml   Last 3 Weights 10/15/2019 10/14/2019 10/13/2019  Weight (lbs) 126 lb 4.8 oz 126 lb 15.8 oz 123 lb 7.3 oz  Weight (kg) 57.29 kg 57.6 kg 56 kg      Telemetry    Afib with ventricular rates in the 100s at rest, 150s with activity or excitement - Personally Reviewed  ECG    No new tracings - Personally Reviewed  Physical Exam   GEN: elderly female in NAD  Neck: minimal JVD Cardiac: RRR, no murmurs, rubs, or gallops.  Respiratory: diminished in bases. GI: Soft, nontender, non-distended  MS: No edema; No deformity. Neuro:  Nonfocal  Psych: Normal affect   Labs    High Sensitivity Troponin:   Recent Labs  Lab 10/12/19 1305 10/12/19 1550  TROPONINIHS 20* 22*      Chemistry Recent Labs   Lab 10/12/19 1305 10/12/19 1305 10/13/19 1156 10/14/19 0527 10/15/19 0352  NA 134*   < > 135 135 132*  K 4.6   < > 3.8 3.7 3.8  CL 102   < > 98 97* 95*  CO2 21*   < > 26 29 29   GLUCOSE 109*   < > 153* 107* 101*  BUN 19   < > 17 17 19   CREATININE 0.81   < > 0.97 0.98 0.82  CALCIUM 8.7*   < > 8.4* 8.2* 8.2*  PROT 6.4*  --   --   --   --   ALBUMIN 2.9*  --   --   --   --   AST 37  --   --   --   --   ALT 36  --   --   --   --   ALKPHOS 75  --   --   --   --   BILITOT 0.6  --   --   --   --   GFRNONAA >60   < > 51* 50* >60  GFRAA >60   < > 59* 58* >60  ANIONGAP 11   < > 11  9 8   < > = values in this interval not displayed.     Hematology Recent Labs  Lab 10/13/19 1156 10/14/19 0527 10/15/19 0352  WBC 6.6 6.9 6.3  RBC 3.66* 3.48* 3.57*  HGB 11.0* 10.2* 10.7*  HCT 33.1* 31.1* 32.1*  MCV 90.4 89.4 89.9  MCH 30.1 29.3 30.0  MCHC 33.2 32.8 33.3  RDW 15.1 14.9 14.8  PLT 307 315 329    BNP Recent Labs  Lab 10/12/19 1550  BNP 838.8*     DDimer No results for input(s): DDIMER in the last 168 hours.   Radiology    No results found.  Cardiac Studies   Echo 10/12/19:  1. Left ventricular ejection fraction, by estimation, is 40 to 45%. The  left ventricle has mildly decreased function. The left ventricle  demonstrates global hypokinesis. There is moderate concentric left  ventricular hypertrophy. Left ventricular  diastolic function could not be evaluated.   2. Right ventricular systolic function is normal. The right ventricular  size is normal. There is moderately elevated pulmonary artery systolic  pressure.   3. Left atrial size was moderately dilated.   4. Right atrial size was moderately dilated.   5. The mitral valve is normal in structure. Mild mitral valve  regurgitation. No evidence of mitral stenosis.   6. The aortic valve is grossly normal. Aortic valve regurgitation is mild  to moderate. Mild aortic valve sclerosis is present, with no evidence of   aortic valve stenosis.   Patient Profile     86 y.o. female  with a history of dementia, hypertension, hyperlipidemia, osteoporosis, and recurrent UTI who is being followed for the evaluation of atrial fibrillation with RVR.  Assessment & Plan    Atrial fibrillation with RVR - new onset - was moderately rate controlled on cardizem, echo with EF 40-45% - cardizem was stopped, but rates were not well-controlled - PO cardizem restarted but not given yet, continued on lopressor 25 mg BID This patients CHA2DS2-VASc Score and unadjusted Ischemic Stroke Rate (% per year) is equal to 7.2 % stroke rate/year from a score of 5 (CHF, female, 2age, HTN) - continue 2.5 mg eliquis BID - now that she is euvolemic, may need to consider TEE-guided cardioversion given her poor rate control and marginal pressure - long discussion with patient's daughter regarding options - she has not fallen since Nov 2020, previous falls were associated with recurrent UTIs - tentatively scheduled for 9AM tomorrow   Systolic heart failure - new onset - echo this admission with EF 40-45%, suspect this is tachycardia mediated - BNP elevated to 838 - diuresed well on IV lasix - 1.25 L urine output yesterday, overall net negative 3.4 L - weight is 126 lbs - stable - has transitioned to PO lasix 40 mg daily --> given her volume status and pressure - will given 20 mg lasix today - renal function stable, K 3.8 - on losartan 25 mg daily   Hypertension - on 120 mg cardizem CD, 25 mg losartan, 25 mg lopressor BID - was on losartan 100 mg at home - losartan was given this morning - would opt to hold for pressure room    Hyperlipidemia - continue zocor 10/13/2019: Cholesterol 118; HDL 43; LDL Cholesterol 66; Triglycerides 46; VLDL 9   Dementia UTI - per primary       For questions or updates, please contact [facility] Please consult [email or web address] for contact info under        Signed, Levada Dy  Augustin Coupe, PA   10/15/2019, 7:00 AM    Agree with note by Fabian Sharp PA-C  [name] was admitted on Friday with heart failure, A. fib with RVR.  EF was in the 45% range by 2D echo.  She was diuresed and no longer has peripheral edema.  Her lungs are clear.  She is moderately hypotensive making rate control agents difficult.  She was started on low-dose Eliquis and scheduled for TEE guided cardioversion tomorrow morning.  Her exam is benign.   Lorretta Harp, M.D., [place], [facility], Laverta [place] [address] [place], [place]  [address]  [phone] 10/15/2019 11:20 AM

## 2019-10-15 NOTE — Progress Notes (Addendum)
Progress Note  Patient Name: Shirley Bates Date of Encounter: 10/15/2019  [facility] Cardiologist: Glenetta Hew, MD   Subjective   Pt HR in the 140-150s this morning at shift change, received 5 mg IV lopressor. I was paged that pressure is now 94/73, asymptomatic.  Inpatient Medications    Scheduled Meds:  apixaban  2.5 mg Oral BID   aspirin EC  81 mg Oral Daily   diltiazem  120 mg Oral Daily   ferrous sulfate  325 mg Oral Q breakfast   furosemide  40 mg Oral Daily   loratadine  10 mg Oral Daily   losartan  25 mg Oral Daily   metoprolol tartrate  25 mg Oral BID   Continuous Infusions:   PRN Meds: acetaminophen, guaiFENesin-dextromethorphan, ipratropium-albuterol, LORazepam, metoprolol tartrate, ondansetron (ZOFRAN) IV   Vital Signs    Vitals:   10/14/19 1934 10/14/19 2207 10/14/19 2334 10/15/19 0334  BP: 122/72 122/85 (!) 128/95 (!) 118/89  Pulse: 98 (!) 108 96 92  Resp: 21  17 15   Temp: 98.1 F (36.7 C)  98 F (36.7 C) 98.2 F (36.8 C)  TempSrc: Oral  Oral Oral  SpO2: 95%  96% 96%  Weight:    57.3 kg    Intake/Output Summary (Last 24 hours) at 10/15/2019 0700 Last data filed at 10/15/2019 0156 Gross per 24 hour  Intake --  Output 1250 ml  Net -1250 ml   Last 3 Weights 10/15/2019 10/14/2019 10/13/2019  Weight (lbs) 126 lb 4.8 oz 126 lb 15.8 oz 123 lb 7.3 oz  Weight (kg) 57.29 kg 57.6 kg 56 kg      Telemetry    Afib with ventricular rates in the 100s at rest, 150s with activity or excitement - Personally Reviewed  ECG    No new tracings - Personally Reviewed  Physical Exam   GEN: elderly female in NAD  Neck: minimal JVD Cardiac: RRR, no murmurs, rubs, or gallops.  Respiratory: diminished in bases. GI: Soft, nontender, non-distended  MS: No edema; No deformity. Neuro:  Nonfocal  Psych: Normal affect   Labs    High Sensitivity Troponin:   Recent Labs  Lab 10/12/19 1305 10/12/19 1550  TROPONINIHS 20* 22*      Chemistry Recent Labs   Lab 10/12/19 1305 10/12/19 1305 10/13/19 1156 10/14/19 0527 10/15/19 0352  NA 134*   < > 135 135 132*  K 4.6   < > 3.8 3.7 3.8  CL 102   < > 98 97* 95*  CO2 21*   < > 26 29 29   GLUCOSE 109*   < > 153* 107* 101*  BUN 19   < > 17 17 19   CREATININE 0.81   < > 0.97 0.98 0.82  CALCIUM 8.7*   < > 8.4* 8.2* 8.2*  PROT 6.4*  --   --   --   --   ALBUMIN 2.9*  --   --   --   --   AST 37  --   --   --   --   ALT 36  --   --   --   --   ALKPHOS 75  --   --   --   --   BILITOT 0.6  --   --   --   --   GFRNONAA >60   < > 51* 50* >60  GFRAA >60   < > 59* 58* >60  ANIONGAP 11   < > 11  9 8   < > = values in this interval not displayed.     Hematology Recent Labs  Lab 10/13/19 1156 10/14/19 0527 10/15/19 0352  WBC 6.6 6.9 6.3  RBC 3.66* 3.48* 3.57*  HGB 11.0* 10.2* 10.7*  HCT 33.1* 31.1* 32.1*  MCV 90.4 89.4 89.9  MCH 30.1 29.3 30.0  MCHC 33.2 32.8 33.3  RDW 15.1 14.9 14.8  PLT 307 315 329    BNP Recent Labs  Lab 10/12/19 1550  BNP 838.8*     DDimer No results for input(s): DDIMER in the last 168 hours.   Radiology    No results found.  Cardiac Studies   Echo 10/12/19:  1. Left ventricular ejection fraction, by estimation, is 40 to 45%. The  left ventricle has mildly decreased function. The left ventricle  demonstrates global hypokinesis. There is moderate concentric left  ventricular hypertrophy. Left ventricular  diastolic function could not be evaluated.   2. Right ventricular systolic function is normal. The right ventricular  size is normal. There is moderately elevated pulmonary artery systolic  pressure.   3. Left atrial size was moderately dilated.   4. Right atrial size was moderately dilated.   5. The mitral valve is normal in structure. Mild mitral valve  regurgitation. No evidence of mitral stenosis.   6. The aortic valve is grossly normal. Aortic valve regurgitation is mild  to moderate. Mild aortic valve sclerosis is present, with no evidence of   aortic valve stenosis.   Patient Profile     86 y.o. female  with a history of dementia, hypertension, hyperlipidemia, osteoporosis, and recurrent UTI who is being followed for the evaluation of atrial fibrillation with RVR.  Assessment & Plan    Atrial fibrillation with RVR - new onset - was moderately rate controlled on cardizem, echo with EF 40-45% - cardizem was stopped, but rates were not well-controlled - PO cardizem restarted but not given yet, continued on lopressor 25 mg BID This patients CHA2DS2-VASc Score and unadjusted Ischemic Stroke Rate (% per year) is equal to 7.2 % stroke rate/year from a score of 5 (CHF, female, 2age, HTN) - continue 2.5 mg eliquis BID - now that she is euvolemic, may need to consider TEE-guided cardioversion given her poor rate control and marginal pressure - Bates discussion with patient's daughter regarding options - she has not fallen since Nov 2020, previous falls were associated with recurrent UTIs - tentatively scheduled for 9AM tomorrow   Systolic heart failure - new onset - echo this admission with EF 40-45%, suspect this is tachycardia mediated - BNP elevated to 838 - diuresed well on IV lasix - 1.25 L urine output yesterday, overall net negative 3.4 L - weight is 126 lbs - stable - has transitioned to PO lasix 40 mg daily --> given her volume status and pressure - will given 20 mg lasix today - renal function stable, K 3.8 - on losartan 25 mg daily   Hypertension - on 120 mg cardizem CD, 25 mg losartan, 25 mg lopressor BID - was on losartan 100 mg at home - losartan was given this morning - would opt to hold for pressure room    Hyperlipidemia - continue zocor 10/13/2019: Cholesterol 118; HDL 43; LDL Cholesterol 66; Triglycerides 46; VLDL 9   Dementia UTI - per primary       For questions or updates, please contact [facility] Please consult [email or web address] for contact info under        Signed, Shirley Bates  Shirley Coupe, PA   10/15/2019, 7:00 AM    Agree with note by Shirley Sharp PA-C  Shirley Bates was admitted on Friday with heart failure, A. fib with RVR.  EF was in the 45% range by 2D echo.  She was diuresed and no longer has peripheral edema.  Her lungs are clear.  She is moderately hypotensive making rate control agents difficult.  She was started on low-dose Eliquis and scheduled for TEE guided cardioversion tomorrow morning.  Her exam is benign.   Lorretta Harp, M.D., [place], [facility], [place] [address] [place], [place]  [address]  [phone] 10/15/2019 11:20 AM

## 2019-10-15 NOTE — Progress Notes (Signed)
   10/15/19 0804  Assess: MEWS Score  Temp 98 F (36.7 C)  BP 94/73  Pulse Rate (!) 126  ECG Heart Rate (!) 126  Resp 16  Level of Consciousness Alert  SpO2 97 %  O2 Device Nasal Cannula  Patient Activity (if Appropriate) In bed  O2 Flow Rate (L/min) 2 L/min  Assess: MEWS Score  MEWS Temp 0  MEWS Systolic 1  MEWS Pulse 2  MEWS RR 0  MEWS LOC 0  MEWS Score 3  MEWS Score Color Yellow  Assess: if the MEWS score is Yellow or Red  Were vital signs taken at a resting state? Yes  Focused Assessment No change from prior assessment  Early Detection of Sepsis Score *See Row Information* Low  MEWS guidelines implemented *See Row Information* No, previously yellow, continue vital signs every 4 hours  Treat  MEWS Interventions Administered prn meds/treatments  Pain Scale 0-10  Pain Score 0  Take Vital Signs  Increase Vital Sign Frequency  Yellow: Q 2hr X 2 then Q 4hr X 2, if remains yellow, continue Q 4hrs  Escalate  MEWS: Escalate Yellow: discuss with charge nurse/RN and consider discussing with provider and RRT  Notify: Charge Nurse/RN  Name of Charge Nurse/RN Notified Christy RN  Date Charge Nurse/RN Notified 10/15/19  Time Charge Nurse/RN Notified [id number]  Notify: Provider  Provider Name/Title Fabian Sharp PA  Date Provider Notified 10/15/19  Time Provider Notified [phone]  Notification Type Page  Notification Reason Other (Comment) (increase in HR)  Response No new orders  Date of Provider Response 10/15/19  Time of Provider Response 0818  Document  Patient Outcome Stabilized after interventions  Progress note created (see row info) Yes

## 2019-10-15 NOTE — Discharge Instructions (Addendum)
Information on my medicine - ELIQUIS (apixaban)  Why was Eliquis prescribed for you? Eliquis was prescribed for you to reduce the risk of a blood clot forming that can cause a stroke if you have a medical condition called atrial fibrillation (a type of irregular heartbeat).  What do You need to know about Eliquis ? Take your Eliquis TWICE DAILY - one tablet in the morning and one tablet in the evening with or without food. If you have difficulty swallowing the tablet whole please discuss with your pharmacist how to take the medication safely.  Take Eliquis exactly as prescribed by your doctor and DO NOT stop taking Eliquis without talking to the doctor who prescribed the medication.  Stopping may increase your risk of developing a stroke.  Refill your prescription before you run out.  After discharge, you should have regular check-up appointments with your healthcare provider that is prescribing your Eliquis.  In the future your dose may need to be changed if your kidney function or weight changes by a significant amount or as you get older.  What do you do if you miss a dose? If you miss a dose, take it as soon as you remember on the same day and resume taking twice daily.  Do not take more than one dose of ELIQUIS at the same time to make up a missed dose.  Important Safety Information A possible side effect of Eliquis is bleeding. You should call your healthcare provider right away if you experience any of the following: ? Bleeding from an injury or your nose that does not stop. ? Unusual colored urine (red or dark brown) or unusual colored stools (red or black). ? Unusual bruising for unknown reasons. ? A serious fall or if you hit your head (even if there is no bleeding).  Some medicines may interact with Eliquis and might increase your risk of bleeding or clotting while on Eliquis. To help avoid this, consult your healthcare provider or pharmacist prior to using any new  prescription or non-prescription medications, including herbals, vitamins, non-steroidal anti-inflammatory drugs (NSAIDs) and supplements.  This website has more information on Eliquis (apixaban): http://www.eliquis.com/eliquis/home      AVOID ANY OVER THE COUNTER PRODUCT WITH PSEUDOEPHEDRINE (SUDAFED)- ask your pharmacist if any questions.     Low-Sodium Eating Plan Sodium, which is an element that makes up salt, helps you maintain a healthy balance of fluids in your body. Too much sodium can increase your blood pressure and cause fluid and waste to be held in your body. Your health care provider or dietitian may recommend following this plan if you have high blood pressure (hypertension), kidney disease, liver disease, or heart failure. Eating less sodium can help lower your blood pressure, reduce swelling, and protect your heart, liver, and kidneys. What are tips for following this plan? General guidelines  Most people on this plan should limit their sodium intake to 1,500-2,000 mg (milligrams) of sodium each day. Reading food labels   The Nutrition Facts label lists the amount of sodium in one serving of the food. If you eat more than one serving, you must multiply the listed amount of sodium by the number of servings.  Choose foods with less than 140 mg of sodium per serving.  Avoid foods with 300 mg of sodium or more per serving. Shopping  Look for lower-sodium products, often labeled as "low-sodium" or "no salt added."  Always check the sodium content even if foods are labeled as "unsalted" or "no salt  added".  Buy fresh foods. ? Avoid canned foods and premade or frozen meals. ? Avoid canned, cured, or processed meats  Buy breads that have less than 80 mg of sodium per slice. Cooking  Eat more home-cooked food and less restaurant, buffet, and fast food.  Avoid adding salt when cooking. Use salt-free seasonings or herbs instead of table salt or sea salt. Check with  your health care provider or pharmacist before using salt substitutes.  Cook with plant-based oils, such as canola, sunflower, or olive oil. Meal planning  When eating at a restaurant, ask that your food be prepared with less salt or no salt, if possible.  Avoid foods that contain MSG (monosodium glutamate). MSG is sometimes added to [place] food, bouillon, and some canned foods. What foods are recommended? The items listed may not be a complete list. Talk with your dietitian about what dietary choices are best for you. Grains Low-sodium cereals, including oats, puffed wheat and rice, and shredded wheat. Low-sodium crackers. Unsalted rice. Unsalted pasta. Low-sodium bread. Whole-grain breads and whole-grain pasta. Vegetables Fresh or frozen vegetables. "No salt added" canned vegetables. "No salt added" tomato sauce and paste. Low-sodium or reduced-sodium tomato and vegetable juice. Fruits Fresh, frozen, or canned fruit. Fruit juice. Meats and other protein foods Fresh or frozen (no salt added) meat, poultry, seafood, and fish. Low-sodium canned tuna and salmon. Unsalted nuts. Dried peas, beans, and lentils without added salt. Unsalted canned beans. Eggs. Unsalted nut butters. Dairy Milk. Soy milk. Cheese that is naturally low in sodium, such as ricotta cheese, fresh mozzarella, or Swiss cheese Low-sodium or reduced-sodium cheese. Cream cheese. Yogurt. Fats and oils Unsalted butter. Unsalted margarine with no trans fat. Vegetable oils such as canola or olive oils. Seasonings and other foods Fresh and dried herbs and spices. Salt-free seasonings. Low-sodium mustard and ketchup. Sodium-free salad dressing. Sodium-free light mayonnaise. Fresh or refrigerated horseradish. Lemon juice. Vinegar. Homemade, reduced-sodium, or low-sodium soups. Unsalted popcorn and pretzels. Low-salt or salt-free chips. What foods are not recommended? The items listed may not be a complete list. Talk with your  dietitian about what dietary choices are best for you. Grains Instant hot cereals. Bread stuffing, pancake, and biscuit mixes. Croutons. Seasoned rice or pasta mixes. Noodle soup cups. Boxed or frozen macaroni and cheese. Regular salted crackers. Self-rising flour. Vegetables Sauerkraut, pickled vegetables, and relishes. Olives. [place] fries. Onion rings. Regular canned vegetables (not low-sodium or reduced-sodium). Regular canned tomato sauce and paste (not low-sodium or reduced-sodium). Regular tomato and vegetable juice (not low-sodium or reduced-sodium). Frozen vegetables in sauces. Meats and other protein foods Meat or fish that is salted, canned, smoked, spiced, or pickled. Bacon, ham, sausage, hotdogs, corned beef, chipped beef, packaged lunch meats, salt pork, jerky, pickled herring, anchovies, regular canned tuna, sardines, salted nuts. Dairy Processed cheese and cheese spreads. Cheese curds. Blue cheese. Feta cheese. String cheese. Regular cottage cheese. Buttermilk. Canned milk. Fats and oils Salted butter. Regular margarine. Ghee. Bacon fat. Seasonings and other foods Onion salt, garlic salt, seasoned salt, table salt, and sea salt. Canned and packaged gravies. Worcestershire sauce. Tartar sauce. Barbecue sauce. Teriyaki sauce. Soy sauce, including reduced-sodium. Steak sauce. Fish sauce. Oyster sauce. Cocktail sauce. Horseradish that you find on the shelf. Regular ketchup and mustard. Meat flavorings and tenderizers. Bouillon cubes. Hot sauce and Tabasco sauce. Premade or packaged marinades. Premade or packaged taco seasonings. Relishes. Regular salad dressings. Salsa. Potato and tortilla chips. Corn chips and puffs. Salted popcorn and pretzels. Canned or dried soups. Pizza. Frozen  entrees and pot pies. Summary  Eating less sodium can help lower your blood pressure, reduce swelling, and protect your heart, liver, and kidneys.  Most people on this plan should limit their sodium intake to  1,500-2,000 mg (milligrams) of sodium each day.  Canned, boxed, and frozen foods are high in sodium. Restaurant foods, fast foods, and pizza are also very high in sodium. You also get sodium by adding salt to food.  Try to cook at home, eat more fresh fruits and vegetables, and eat less fast food, canned, processed, or prepared foods. This information is not intended to replace advice given to you by your health care provider. Make sure you discuss any questions you have with your health care provider. Document Revised: 02/18/2017 Document Reviewed: 03/01/2016 Elsevier Patient Education  2020 [employer].

## 2019-10-15 NOTE — Progress Notes (Signed)
Inpatient Rehab Admissions Coordinator:  Spoke with pt's daughter, Mickel Baas. She wants her mom to d/c to ALF, instead of admitting to CIR, after discharge from acute care.  She spoke with TOC who was able to confirm that ALF, [facility], would accept pt back.  Will sign off on this pt.   Gayland Curry, [place], [place] Admissions Coordinator [phone]

## 2019-10-15 NOTE — Progress Notes (Signed)
Upon coming into the room to give report it was discovered that pt had an elevated hr running in the 140-150s. Pt given 5mg  of metoprolol iv push for elevate hr over 120 per order. RN for dayshift made aware.

## 2019-10-15 NOTE — Progress Notes (Signed)
PROGRESS NOTE    Shirley Bates   AYT:[id number]  DOB: [birth year]  DOA: 10/12/2019     3  PCP: Susy Frizzle, MD  CC: brought from nursing facility for cough, SOB  Hospital Course: Shirley Bates is a 86 y.o. female with medical history significant of severe dementia, hypertension, hyperlipidemia, osteoporosis, recurrent UTI (with colonization) who presented to the ER emergency department with a cough that had been going on for approximately 2 to 3 days as well as change in mentation per her daughter.  The patient resides at an assisted living facility however requires a memory unit due to her severe dementia. Her daughter also stated that she has had recurrent UTIs in the past that have affected her mentation but that she may now also be colonized due to her number of infections. On work-up in the ER she was found to be in new onset A. fib with RVR with rate in the 170s.  She was started on a Cardizem infusion and admitted for further work-up and treatment.  Cardiology was also consulted on admission.  The morning following admission, CODE STATUS was discussed with her daughter.  She stated that her mom would not want to be on life support or put through resuscitation if her heart stopped especially knowing that that was the sign of a larger decline.  The patient's CODE STATUS was then changed to DNR on 10/13/2019 after discussion with her daughter bedside.  She continued to improve clinically with ongoing treatment for A. fib with RVR.  Her urine culture did ultimately grow out E. coli on 10/14/2019 but she remained afebrile with no leukocytosis and her mentation was already improving.  Therefore, this was considered colonization and if she showed further signs of developing infection, then antibiotics would be started.  Due to ongoing uncontrolled rate with underlying hypotension and patient becoming more lethargic, decision was made to pursue TEE cardioversion tentatively on 10/16/2019 per  cardiology.  Social work has also met with patient and family.  Tentative plan is for discharging back to [facility] assisted living facility with physical therapy orders in place.   Interval History:  Overnight still having episodes of RVR; her BP was low this am 90s/60s and she is more lethargic this morning. Still no fever nor signs of developing infection; lethargy likely from her underlying intermittent RVR.  Family present bedside and states plan is to do TEE CV tomorrow. At time of discharge she would like to take patient back to [facility] and continue with PT there.   Old records reviewed in assessment of this patient  ROS: Review of systems not obtained due to patient factors.  Severe dementia  Assessment & Plan: Hyperlipidemia -At her age of 71 with severe dementia, likely to not have any further mortality benefit from statin use (LDL 66 on lipid profile) -Discontinue Zocor  Hypertension -Blood pressure currently low normal especially on Cardizem drip.  Will monitor pressure while transitioning to orals - home losartan initially decreased, now stopped due to hypotension  Atrial fibrillation with RVR ([place]) -New onset and underlying etiology possibly due to CHF exacerbation given elevated BNP and good response to lasix so far vs UTI which is recurrent for her (low suspicion, ruled out for now) - continue lasix - transitioned off cardizem drip to 120 mg oral daily; also on lopressor 25 mg BID; BP becoming soft; d/c losartan for now per cardiology rec's as well - EF 40-45% on echo with LV global hypokinesis. -  continue eliquis  -Cardiology following, appreciate assistance - given hypotension with difficult to control rate, cardiology considering TEE CV tentatively for 7/27  Fluid overload -Diuresing well -Continue Lasix  Normocytic anemia - iron stores borderline low - start on oral iron  Bacteriuria -Unclear if infectious versus colonized on admission.  No  obvious signs of infection on lab work-up and in setting of dementia, difficult to know if truly symptomatic -Urine culture did ultimately grow out E. coli.  Her mentation was already improving with no antibiotics and she remained hemodynamically stable with no leukocytosis and remained afebrile.  At this time, we will hold off on antibiotics and consider this colonization unless she does develop further signs or symptoms suggesting true infection  Impaired mobility and activities of daily living - patient lives in ALF in memory unit; PT has recommended CIR - daughter is considering CIR but also wishes to discuss options with her ALF - continue working with PT  - after discussions, plan will be for d/c to [facility] ALF with PT in place once she is medically stable   Antimicrobials: None  DVT prophylaxis: Eliquis Code Status: DNR Family Communication: Daughter bedside Disposition Plan:  Status is: Inpatient  Remains inpatient appropriate because:Hemodynamically unstable, Altered mental status, Unsafe d/c plan, IV treatments appropriate due to intensity of illness or inability to take PO and Inpatient level of care appropriate due to severity of illness   Dispo: The patient is from: ALF              Anticipated d/c is to: ALF              Anticipated d/c date is: 1 day              Patient currently is not medically stable to d/c.  Objective: Blood pressure (!) 116/87, pulse (!) 126, temperature (!) 97.5 F (36.4 C), temperature source Oral, resp. rate 16, weight 57.3 kg, SpO2 98 %.  Examination: General appearance: Elderly demented woman laying in bed in no distress and is able to answer some questions.  Daughter is bedside. Head: Normocephalic, without obvious abnormality, atraumatic Eyes: EOMI Lungs: clear to auscultation bilaterally Heart: irregularly irregular rhythm and S1, S2 normal Abdomen: Soft, nontender, nondistended, bowel sounds present Extremities: No  edema Skin: mobility and turgor normal Neurologic: Moves all 4 extremities.  Exam limited by severe dementia  Consultants:   Cardiology  Data Reviewed: I have personally reviewed following labs and imaging studies Results for orders placed or performed during the hospital encounter of 10/12/19 (from the past 24 hour(s))  Basic metabolic panel     Status: Abnormal   Collection Time: 10/15/19  3:52 AM  Result Value Ref Range   Sodium 132 (L) 135 - 145 mmol/L   Potassium 3.8 3.5 - 5.1 mmol/L   Chloride 95 (L) 98 - 111 mmol/L   CO2 29 22 - 32 mmol/L   Glucose, Bld 101 (H) 70 - 99 mg/dL   BUN 19 8 - 23 mg/dL   Creatinine, Ser 0.82 0.44 - 1.00 mg/dL   Calcium 8.2 (L) 8.9 - 10.3 mg/dL   GFR calc non Af Amer >60 >60 mL/min   GFR calc Af Amer >60 >60 mL/min   Anion gap 8 5 - 15  CBC with Differential/Platelet     Status: Abnormal   Collection Time: 10/15/19  3:52 AM  Result Value Ref Range   WBC 6.3 4.0 - 10.5 K/uL   RBC 3.57 (L) 3.87 -  5.11 MIL/uL   Hemoglobin 10.7 (L) 12.0 - 15.0 g/dL   HCT 32.1 (L) 36 - 46 %   MCV 89.9 80.0 - 100.0 fL   MCH 30.0 26.0 - 34.0 pg   MCHC 33.3 30.0 - 36.0 g/dL   RDW 14.8 11.5 - 15.5 %   Platelets 329 150 - 400 K/uL   nRBC 0.0 0.0 - 0.2 %   Neutrophils Relative % 67 %   Neutro Abs 4.2 1.7 - 7.7 K/uL   Lymphocytes Relative 17 %   Lymphs Abs 1.1 0.7 - 4.0 K/uL   Monocytes Relative 11 %   Monocytes Absolute 0.7 0 - 1 K/uL   Eosinophils Relative 4 %   Eosinophils Absolute 0.3 0 - 0 K/uL   Basophils Relative 1 %   Basophils Absolute 0.1 0 - 0 K/uL   Immature Granulocytes 0 %   Abs Immature Granulocytes 0.02 0.00 - 0.07 K/uL  Magnesium     Status: None   Collection Time: 10/15/19  3:52 AM  Result Value Ref Range   Magnesium 1.9 1.7 - 2.4 mg/dL    Recent Results (from the past 240 hour(s))  SARS Coronavirus 2 by RT PCR (hospital order, performed in [place] hospital lab) Nasopharyngeal Nasopharyngeal Swab     Status: None   Collection Time:  10/12/19  3:50 PM   Specimen: Nasopharyngeal Swab  Result Value Ref Range Status   SARS Coronavirus 2 NEGATIVE NEGATIVE Final    Comment: (NOTE) SARS-CoV-2 target nucleic acids are NOT DETECTED.  The SARS-CoV-2 RNA is generally detectable in upper and lower respiratory specimens during the acute phase of infection. The lowest concentration of SARS-CoV-2 viral copies this assay can detect is 250 copies / mL. A negative result does not preclude SARS-CoV-2 infection and should not be used as the sole basis for treatment or other patient management decisions.  A negative result may occur with improper specimen collection / handling, submission of specimen other than nasopharyngeal swab, presence of viral mutation(s) within the areas targeted by this assay, and inadequate number of viral copies (<250 copies / mL). A negative result must be combined with clinical observations, patient history, and epidemiological information.  Fact Sheet for Patients:   StrictlyIdeas.no  Fact Sheet for Healthcare Providers: BankingDealers.co.za  This test is not yet approved or  cleared by the [place] FDA and has been authorized for detection and/or diagnosis of SARS-CoV-2 by FDA under an Emergency Use Authorization (EUA).  This EUA will remain in effect (meaning this test can be used) for the duration of the COVID-19 declaration under Section 564(b)(1) of the Act, 21 U.S.C. section 360bbb-3(b)(1), unless the authorization is terminated or revoked sooner.  Performed at [facility], [place] [address] [place], [place] [address]   Urine Culture     Status: Abnormal   Collection Time: 10/13/19  3:33 PM   Specimen: Urine, Clean Catch  Result Value Ref Range Status   Specimen Description URINE, CLEAN CATCH  Final   Special Requests   Final    NONE Performed at [facility], [place] [address], [place], [place] [address]    Culture >=100,000  COLONIES/mL ESCHERICHIA COLI (A)  Final   Report Status 10/15/2019 FINAL  Final   Organism ID, Bacteria ESCHERICHIA COLI (A)  Final      Susceptibility   Escherichia coli - MIC*    AMPICILLIN >=32 RESISTANT Resistant     CEFAZOLIN <=4 SENSITIVE Sensitive  CEFTRIAXONE <=0.25 SENSITIVE Sensitive     CIPROFLOXACIN >=4 RESISTANT Resistant     GENTAMICIN <=1 SENSITIVE Sensitive     IMIPENEM <=0.25 SENSITIVE Sensitive     NITROFURANTOIN <=16 SENSITIVE Sensitive     TRIMETH/SULFA >=320 RESISTANT Resistant     AMPICILLIN/SULBACTAM >=32 RESISTANT Resistant     PIP/TAZO <=4 SENSITIVE Sensitive     * >=100,000 COLONIES/mL ESCHERICHIA COLI     Radiology Studies: No results found. DG Chest Port 1 View  Final Result       Scheduled Meds: . apixaban  2.5 mg Oral BID  . diltiazem  120 mg Oral Daily  . ferrous sulfate  325 mg Oral Q breakfast  . [START ON 10/16/2019] furosemide  20 mg Oral Daily  . loratadine  10 mg Oral Daily  . metoprolol tartrate  25 mg Oral BID   PRN Meds: acetaminophen, guaiFENesin-dextromethorphan, ipratropium-albuterol, LORazepam, metoprolol tartrate, ondansetron (ZOFRAN) IV Continuous Infusions:     LOS: 3 days  Time spent: Greater than 50% of the 35 minute visit was spent in counseling/coordination of care for the patient as laid out in the A&P.   Dwyane Dee, MD [facility] 10/15/2019, 3:57 PM   Contact via secure chat.  To contact the attending provider between 7A-7P or the covering provider during after hours 7P-7A, please log into the web site [email or web address] and access using universal [place] password for that web site. If you do not have the password, please call the hospital operator.

## 2019-10-15 NOTE — Plan of Care (Signed)
  Problem: Clinical Measurements: Goal: Respiratory complications will improve Outcome: Progressing   

## 2019-10-15 NOTE — Progress Notes (Signed)
    [facility] has been requested to perform a transesophageal echocardiogram on Shirley Bates for atrial fibrillation.  After careful review of history and examination, the risks and benefits of transesophageal echocardiogram have been explained including risks of esophageal damage, perforation (1:10,000 risk), bleeding, pharyngeal hematoma as well as other potential complications associated with conscious sedation including aspiration, arrhythmia, respiratory failure and death. Alternatives to treatment were discussed, questions were answered. Patient is willing to proceed.   She is scheduled for tomorrow at 0900. NPO at [place].  Tami Lin Ali [place], [place]  10/15/2019 2:21 PM

## 2019-10-15 NOTE — TOC Initial Note (Signed)
Transition of Care [facility]) - Initial/Assessment Note    Patient Details  Name: Shirley Bates MRN: [id number] Date of Birth: [birth year]  Transition of Care [facility]) CM/SW Contact:    Trula Ore, [place] Phone Number: 10/15/2019, 2:22 PM  Clinical Narrative:                  CSW spoke with patients daughter Mickel Baas and let her know that [facility] will be able to accept patient back and provide physical therapy for patient there when medically ready. Patients daughter agreed to patient going back to ALF with home health orders for therapy.   Plan is for patient to go back [facility].  CSW will continue to follow.  CSW will continue to follow. Expected Discharge Plan: Assisted Living Barriers to Discharge: Continued Medical Work up   Patient Goals and CMS Choice Patient states their goals for this hospitalization and ongoing recovery are:: to go ALF   Choice offered to / list presented to : Adult Children (Daughter Mickel Baas)  Expected Discharge Plan and Services Expected Discharge Plan: Assisted Living       Living arrangements for the past 2 months: [place]                                      Prior Living Arrangements/Services Living arrangements for the past 2 months: [place] Lives with:: Self, Facility Resident Patient language and need for interpreter reviewed:: Yes Do you feel safe going back to the place where you live?: Yes      Need for Family Participation in Patient Care: Yes (Comment) Care giver support system in place?: Yes (comment)   Criminal Activity/Legal Involvement Pertinent to Current Situation/Hospitalization: No - Comment as needed  Activities of Daily Living      Permission Sought/Granted Permission sought to share information with : Case Manager, Family Supports, Customer service manager                Emotional Assessment       Orientation: : Oriented to Self, Oriented  to Place Alcohol / Substance Use: Not Applicable Psych Involvement: No (comment)  Admission diagnosis:  Normocytic anemia [D64.9] Pulmonary edema cardiac cause ([place]) [I50.1] Left ear impacted cerumen [H61.22] Atrial fibrillation with RVR ([place]) [I48.91] Patient Active Problem List   Diagnosis Date Noted  . Impaired mobility and activities of daily living 10/14/2019  . Atrial fibrillation with RVR ([place]) 10/12/2019  . Fluid overload 10/12/2019  . Normocytic anemia 10/12/2019  . Osteoporosis 05/28/2015  . Left ear impacted cerumen 12/12/2012  . OA (osteoarthritis) of neck 12/12/2012  . Hyperlipidemia   . Hypertension    PCP:  Susy Frizzle, MD Pharmacy:   [facility] ORDER) [place], [place] [address] Phone: [phone] Fax: [phone]  [facility] [place], [place] [address] Phone: [phone] Fax: [phone]  [place], [place] [address] [place] [place] [address] Phone: [phone] Fax: [phone]     Social Determinants of Health (SDOH) Interventions    Readmission Risk Interventions No flowsheet data found.

## 2019-10-16 ENCOUNTER — Inpatient Hospital Stay (HOSPITAL_COMMUNITY): Payer: Medicare Other | Admitting: Anesthesiology

## 2019-10-16 ENCOUNTER — Encounter (HOSPITAL_COMMUNITY)
Admission: EM | Disposition: A | Discharge status: Skilled Nursing Facility | Payer: Self-pay | Source: Skilled Nursing Facility | Attending: Internal Medicine

## 2019-10-16 ENCOUNTER — Encounter (HOSPITAL_COMMUNITY): Payer: Self-pay | Admitting: Internal Medicine

## 2019-10-16 ENCOUNTER — Inpatient Hospital Stay (HOSPITAL_COMMUNITY): Payer: Medicare Other

## 2019-10-16 DIAGNOSIS — I361 Nonrheumatic tricuspid (valve) insufficiency: Secondary | ICD-10-CM

## 2019-10-16 DIAGNOSIS — I351 Nonrheumatic aortic (valve) insufficiency: Secondary | ICD-10-CM

## 2019-10-16 DIAGNOSIS — I340 Nonrheumatic mitral (valve) insufficiency: Secondary | ICD-10-CM

## 2019-10-16 DIAGNOSIS — I5041 Acute combined systolic (congestive) and diastolic (congestive) heart failure: Secondary | ICD-10-CM | POA: Diagnosis not present

## 2019-10-16 DIAGNOSIS — I4891 Unspecified atrial fibrillation: Secondary | ICD-10-CM | POA: Diagnosis not present

## 2019-10-16 HISTORY — PX: CARDIOVERSION: SHX1299

## 2019-10-16 HISTORY — PX: TEE WITHOUT CARDIOVERSION: SHX5443

## 2019-10-16 LAB — BASIC METABOLIC PANEL
Anion gap: 10 (ref 5–15)
BUN: 15 mg/dL (ref 8–23)
CO2: 25 mmol/L (ref 22–32)
Calcium: 8.1 mg/dL — ABNORMAL LOW (ref 8.9–10.3)
Chloride: 96 mmol/L — ABNORMAL LOW (ref 98–111)
Creatinine, Ser: 0.69 mg/dL (ref 0.44–1.00)
GFR calc Af Amer: >60 mL/min (ref >60)
GFR calc non Af Amer: >60 mL/min (ref >60)
Glucose, Bld: 111 mg/dL — ABNORMAL HIGH (ref 70–99)
Potassium: 4.2 mmol/L (ref 3.5–5.1)
Sodium: 131 mmol/L — ABNORMAL LOW (ref 135–145)

## 2019-10-16 LAB — CBC WITH DIFFERENTIAL/PLATELET
Abs Immature Granulocytes: 0.03 10*3/uL (ref 0.00–0.07)
Basophils Absolute: 0.1 10*3/uL (ref 0.0–0.1)
Basophils Relative: 1 %
Eosinophils Absolute: 0.1 10*3/uL (ref 0.0–0.5)
Eosinophils Relative: 2 %
HCT: 35.6 % — ABNORMAL LOW (ref 36.0–46.0)
Hemoglobin: 11.7 g/dL — ABNORMAL LOW (ref 12.0–15.0)
Immature Granulocytes: 0 %
Lymphocytes Relative: 12 %
Lymphs Abs: 0.8 10*3/uL (ref 0.7–4.0)
MCH: 29.8 pg (ref 26.0–34.0)
MCHC: 32.9 g/dL (ref 30.0–36.0)
MCV: 90.8 fL (ref 80.0–100.0)
Monocytes Absolute: 0.6 10*3/uL (ref 0.1–1.0)
Monocytes Relative: 9 %
Neutro Abs: 5.1 10*3/uL (ref 1.7–7.7)
Neutrophils Relative %: 76 %
Platelets: 314 10*3/uL (ref 150–400)
RBC: 3.92 MIL/uL (ref 3.87–5.11)
RDW: 15.1 % (ref 11.5–15.5)
WBC: 6.7 10*3/uL (ref 4.0–10.5)
nRBC: 0 % (ref 0.0–0.2)

## 2019-10-16 LAB — MAGNESIUM: Magnesium: 1.8 mg/dL (ref 1.7–2.4)

## 2019-10-16 LAB — ECHO TEE
MV M vel: 5.12 m/s
MV Peak grad: 104.9 mmHg
Radius: 0.5 cm

## 2019-10-16 SURGERY — ECHOCARDIOGRAM, TRANSESOPHAGEAL
Anesthesia: General

## 2019-10-16 MED ORDER — EPHEDRINE SULFATE-NACL 50-0.9 MG/10ML-% IV SOSY
PREFILLED_SYRINGE | INTRAVENOUS | Status: DC | PRN
  Administered 2019-10-16 (×2): 5 mg via INTRAVENOUS

## 2019-10-16 MED ORDER — PROPOFOL 500 MG/50ML IV EMUL
INTRAVENOUS | Status: DC | PRN
  Administered 2019-10-16: 50 ug/kg/min via INTRAVENOUS

## 2019-10-16 MED ORDER — AMIODARONE HCL IN DEXTROSE 360-4.14 MG/200ML-% IV SOLN
60.0000 mg/h | INTRAVENOUS | Status: AC
  Administered 2019-10-16: 60 mg/h via INTRAVENOUS

## 2019-10-16 MED ORDER — PHENYLEPHRINE 40 MCG/ML (10ML) SYRINGE FOR IV PUSH (FOR BLOOD PRESSURE SUPPORT)
PREFILLED_SYRINGE | INTRAVENOUS | Status: DC | PRN
  Administered 2019-10-16: 120 ug via INTRAVENOUS

## 2019-10-16 MED ORDER — METOPROLOL SUCCINATE ER 50 MG PO TB24
75.0000 mg | ORAL_TABLET | Freq: Every day | ORAL | Status: DC
  Administered 2019-10-16: 75 mg via ORAL
  Filled 2019-10-16: qty 1

## 2019-10-16 MED ORDER — AMIODARONE HCL IN DEXTROSE 360-4.14 MG/200ML-% IV SOLN
30.0000 mg/h | INTRAVENOUS | Status: DC
  Administered 2019-10-16 – 2019-10-17 (×2): 30 mg/h via INTRAVENOUS
  Filled 2019-10-16 (×3): qty 200

## 2019-10-16 MED ORDER — AMIODARONE IV BOLUS ONLY 150 MG/100ML
INTRAVENOUS | Status: DC | PRN
Start: 2019-10-16 — End: 2019-10-16
  Administered 2019-10-16 (×2): 75 mg via INTRAVENOUS

## 2019-10-16 MED ORDER — AMIODARONE IV BOLUS ONLY 150 MG/100ML: 150.0000 mg | Freq: Once | INTRAVENOUS | Status: AC

## 2019-10-16 NOTE — Progress Notes (Signed)
PROGRESS NOTE    Shirley Bates   ZOX:[id number]  DOB: February 27, 1926  DOA: 10/12/2019     4  PCP: Susy Frizzle, MD  CC: brought from nursing facility for cough, SOB  Hospital Course: Shirley Bates is a 86 y.o. female with medical history significant of severe dementia, hypertension, hyperlipidemia, osteoporosis, recurrent UTI (with colonization) who presented to the ER emergency department with a cough that had been going on for approximately 2 to 3 days as well as change in mentation per her daughter.  The patient resides at an assisted living facility however requires a memory unit due to her severe dementia. Her daughter also stated that she has had recurrent UTIs in the past that have affected her mentation but that she may now also be colonized due to her number of infections. On work-up in the ER she was found to be in new onset A. fib with RVR with rate in the 170s.  She was started on a Cardizem infusion and admitted for further work-up and treatment.  Cardiology was also consulted on admission.  The morning following admission, CODE STATUS was discussed with her daughter.  She stated that her mom would not want to be on life support or put through resuscitation if her heart stopped especially knowing that that was the sign of a larger decline.  The patient's CODE STATUS was then changed to DNR on 10/13/2019 after discussion with her daughter bedside.  She continued to improve clinically with ongoing treatment for A. fib with RVR.  Her urine culture did ultimately grow out E. coli on 10/14/2019 but she remained afebrile with no leukocytosis and her mentation was already improving.  Therefore, this was considered colonization and if she showed further signs of developing infection, then antibiotics would be started.  Due to ongoing uncontrolled rate with underlying hypotension and patient becoming more lethargic, decision was made to pursue TEE cardioversion tentatively on 10/16/2019 per  cardiology.  Social work has also met with patient and family.  Tentative plan is for discharging back to [facility] assisted living facility with physical therapy orders in place.   Interval History:   Patient just returned from TEE.  Patient's wife states that she is "in a bad mood.  She will not want to talk with you".  Patient herself pushed me away when I tried to examine her.  Old records reviewed in assessment of this patient  ROS: Review of systems not obtained due to patient factors.  Severe dementia  Assessment & Plan:  86 year old female with advanced dementia, HTN was admitted with dyspnea found to be in new A. fib with RVR and consequent acute decompensated heart failure.  Acute decompensated systolic and diastolic CHF Patient has diuresed with Lasix Very much appreciate ongoing cardiology input EF 30 to 35% with mild MR and moderate AR and TR. Medicine consolidated to Toprol-XL 75 and Lasix 20 daily  New onset A. fib with RVR Heart rate had been difficult to control secondary to hypotension. Patient had remained tachycardic after transition off of diltiazem drip and initiation of Lopressor Rate control efforts was limited by hypotension She recently underwent TEE DCCV today which failed to abolish A. Fib Patient was started on amiodarone bolus and drip today 727 Blood pressure is improved Continue Eliquis  Elevated troponin Thought to be secondary to demand ischemia secondary to RVR Troponin has remained flat  Bacteriuria Unclear if this is acute UTI versus asymptomatic bacteriuria No signs or symptoms of infection  other than altered mental status Mental status improved without treatment of bacteria in urine At this point this is thought to be asymptomatic bacteriuria even though it is growing out E. coli  HTN Follow on Toprol-XL Losartan had been held due to hypotension  Disposition secondary to impaired mobility, advanced dementia Patient presently  lives in ALF in a memory unit PT has recommended CIR Plan is presently for discharge to [facility] with PT when she is medically stable  Anemia Normocytic but with low iron stores Started on iron  Antimicrobials: None  DVT prophylaxis: Eliquis Code Status: DNR Family Communication: Daughter bedside Disposition Plan:  Status is: Inpatient  Remains inpatient appropriate because:Hemodynamically unstable, Altered mental status, Unsafe d/c plan, IV treatments appropriate due to intensity of illness or inability to take PO and Inpatient level of care appropriate due to severity of illness   Dispo: The patient is from: ALF              Anticipated d/c is to: ALF              Anticipated d/c date is: 1 day              Patient currently is not medically stable to d/c.  Objective: Blood pressure 116/71, pulse 67, temperature 97.7 F (36.5 C), temperature source Oral, resp. rate 19, height 5' 2"  (1.575 m), weight 52.2 kg, SpO2 98 %.  Examination: General appearance: Patient lying flat in bed in no acute respiratory distress.  She is somewhat irritable.  Attentive daughter is at bedside.   Lungs: clear to auscultation bilaterally Heart: irregularly irregular rhythm and S1, S2 normal Abdomen: Soft, nontender, nondistended, bowel sounds present Extremities: No edema Skin: mobility and turgor normal Neurologic: Exam limited by severe dementia  Consultants:   Cardiology  Data Reviewed: I have personally reviewed following labs and imaging studies Results for orders placed or performed during the hospital encounter of 10/12/19 (from the past 24 hour(s))  Protime-INR     Status: None   Collection Time: 10/15/19  7:45 PM  Result Value Ref Range   Prothrombin Time 14.8 11.4 - 15.2 seconds   INR 1.2 0.8 - 1.2  Basic metabolic panel     Status: Abnormal   Collection Time: 10/16/19 10:58 AM  Result Value Ref Range   Sodium 131 (L) 135 - 145 mmol/L   Potassium 4.2 3.5 - 5.1  mmol/L   Chloride 96 (L) 98 - 111 mmol/L   CO2 25 22 - 32 mmol/L   Glucose, Bld 111 (H) 70 - 99 mg/dL   BUN 15 8 - 23 mg/dL   Creatinine, Ser 0.69 0.44 - 1.00 mg/dL   Calcium 8.1 (L) 8.9 - 10.3 mg/dL   GFR calc non Af Amer >60 >60 mL/min   GFR calc Af Amer >60 >60 mL/min   Anion gap 10 5 - 15  CBC with Differential/Platelet     Status: Abnormal   Collection Time: 10/16/19 10:58 AM  Result Value Ref Range   WBC 6.7 4.0 - 10.5 K/uL   RBC 3.92 3.87 - 5.11 MIL/uL   Hemoglobin 11.7 (L) 12.0 - 15.0 g/dL   HCT 35.6 (L) 36 - 46 %   MCV 90.8 80.0 - 100.0 fL   MCH 29.8 26.0 - 34.0 pg   MCHC 32.9 30.0 - 36.0 g/dL   RDW 15.1 11.5 - 15.5 %   Platelets 314 150 - 400 K/uL   nRBC 0.0 0.0 - 0.2 %  Neutrophils Relative % 76 %   Neutro Abs 5.1 1.7 - 7.7 K/uL   Lymphocytes Relative 12 %   Lymphs Abs 0.8 0.7 - 4.0 K/uL   Monocytes Relative 9 %   Monocytes Absolute 0.6 0 - 1 K/uL   Eosinophils Relative 2 %   Eosinophils Absolute 0.1 0 - 0 K/uL   Basophils Relative 1 %   Basophils Absolute 0.1 0 - 0 K/uL   Immature Granulocytes 0 %   Abs Immature Granulocytes 0.03 0.00 - 0.07 K/uL  Magnesium     Status: None   Collection Time: 10/16/19 10:58 AM  Result Value Ref Range   Magnesium 1.8 1.7 - 2.4 mg/dL    Recent Results (from the past 240 hour(s))  SARS Coronavirus 2 by RT PCR (hospital order, performed in [facility] hospital lab) Nasopharyngeal Nasopharyngeal Swab     Status: None   Collection Time: 10/12/19  3:50 PM   Specimen: Nasopharyngeal Swab  Result Value Ref Range Status   SARS Coronavirus 2 NEGATIVE NEGATIVE Final    Comment: (NOTE) SARS-CoV-2 target nucleic acids are NOT DETECTED.  The SARS-CoV-2 RNA is generally detectable in upper and lower respiratory specimens during the acute phase of infection. The lowest concentration of SARS-CoV-2 viral copies this assay can detect is 250 copies / mL. A negative result does not preclude SARS-CoV-2 infection and should not be used as  the sole basis for treatment or other patient management decisions.  A negative result may occur with improper specimen collection / handling, submission of specimen other than nasopharyngeal swab, presence of viral mutation(s) within the areas targeted by this assay, and inadequate number of viral copies (<250 copies / mL). A negative result must be combined with clinical observations, patient history, and epidemiological information.  Fact Sheet for Patients:   StrictlyIdeas.no  Fact Sheet for Healthcare Providers: BankingDealers.co.za  This test is not yet approved or  cleared by the [place] FDA and has been authorized for detection and/or diagnosis of SARS-CoV-2 by FDA under an Emergency Use Authorization (EUA).  This EUA will remain in effect (meaning this test can be used) for the duration of the COVID-19 declaration under Section 564(b)(1) of the Act, 21 U.S.C. section 360bbb-3(b)(1), unless the authorization is terminated or revoked sooner.  Performed at [facility], [place] [address], [place], [place] [address]   Urine Culture     Status: Abnormal   Collection Time: 10/13/19  3:33 PM   Specimen: Urine, Clean Catch  Result Value Ref Range Status   Specimen Description URINE, CLEAN CATCH  Final   Special Requests   Final    NONE Performed at [facility], [place] [address], [place], [place] [address]    Culture >=100,000 COLONIES/mL ESCHERICHIA COLI (A)  Final   Report Status 10/15/2019 FINAL  Final   Organism ID, Bacteria ESCHERICHIA COLI (A)  Final      Susceptibility   Escherichia coli - MIC*    AMPICILLIN >=32 RESISTANT Resistant     CEFAZOLIN <=4 SENSITIVE Sensitive     CEFTRIAXONE <=0.25 SENSITIVE Sensitive     CIPROFLOXACIN >=4 RESISTANT Resistant     GENTAMICIN <=1 SENSITIVE Sensitive     IMIPENEM <=0.25 SENSITIVE Sensitive     NITROFURANTOIN <=16 SENSITIVE Sensitive     TRIMETH/SULFA >=320  RESISTANT Resistant     AMPICILLIN/SULBACTAM >=32 RESISTANT Resistant     PIP/TAZO <=4 SENSITIVE Sensitive     * >=100,000 COLONIES/mL ESCHERICHIA COLI  MRSA  PCR Screening     Status: Abnormal   Collection Time: 10/15/19  4:30 PM   Specimen: Nasal Mucosa; Nasopharyngeal  Result Value Ref Range Status   MRSA by PCR POSITIVE (A) NEGATIVE Final    Comment:        The GeneXpert MRSA Assay (FDA approved for NASAL specimens only), is one component of a comprehensive MRSA colonization surveillance program. It is not intended to diagnose MRSA infection nor to guide or monitor treatment for MRSA infections. RESULT CALLED TO, READ BACK BY AND VERIFIED WITH: Venetia Maxon RN 1820 10/15/19 A BROWNING Performed at [facility], [place] [address], [place], [place] [address]      Radiology Studies: ECHO TEE  Result Date: 10/16/2019    TRANSESOPHOGEAL ECHO REPORT   Patient Name:   ALAYIAH FONTES Date of Exam: 10/16/2019 Medical Rec #:  [id number]     Height:       61.3 in Accession #:    [id number]    Weight:       126.1 lb Date of Birth:  March 09, 1926     BSA:          1.558 m Patient Age:    43 years      BP:           143/93 mmHg Patient Gender: F             HR:           126 bpm. Exam Location:  Inpatient Procedure: Transesophageal Echo, Color Doppler and Cardiac Doppler Indications:     Atrial Fibrillation  History:         Patient has prior history of Echocardiogram examinations, most                  recent 10/12/2019. Risk Factors:Hypertension and Dyslipidemia.  Sonographer:     Mikki Santee RDCS (AE) Referring Phys:  [id number] Tami Lin DUKE Diagnosing Phys: Skeet Latch MD PROCEDURE: The transesophogeal probe was passed without difficulty through the esophogus of the patient. Sedation performed by different physician. The patient was monitored while under deep sedation. Anesthestetic sedation was provided intravenously by Anesthesiology: 62.64m of Propofol. The patient's vital signs;  including heart rate, blood pressure, and oxygen saturation; remained stable throughout the procedure. The patient developed no complications during the procedure. A direct current cardioversion was performed at 120 joules with 1 attempt. IMPRESSIONS  1. Left ventricular ejection fraction, by estimation, is 30 to 35%. The left ventricle has moderately decreased function. The left ventricle demonstrates global hypokinesis.  2. Right ventricular systolic function is moderately reduced. The right ventricular size is normal.  3. No left atrial/left atrial appendage thrombus was detected. The LAA emptying velocity was 62 cm/s.  4. Multiple regurgitant jets. The mitral valve is normal in structure. Mild mitral valve regurgitation. No evidence of mitral stenosis.  5. Tricuspid valve regurgitation is mild to moderate.  6. The aortic valve is tricuspid. Aortic valve regurgitation is mild to moderate. No aortic stenosis is present.  7. There is mild (Grade II) layered plaque involving the transverse aorta and descending aorta.  8. The inferior vena cava is normal in size with greater than 50% respiratory variability, suggesting right atrial pressure of 3 mmHg. Conclusion(s)/Recommendation(s): Normal biventricular function without evidence of hemodynamically significant valvular heart disease. FINDINGS  Left Ventricle: Left ventricular ejection fraction, by estimation, is 30 to 35%. The left ventricle has moderately decreased function. The left ventricle demonstrates global hypokinesis. The left  ventricular internal cavity size was normal in size. There is no left ventricular hypertrophy. Right Ventricle: The right ventricular size is normal. No increase in right ventricular wall thickness. Right ventricular systolic function is moderately reduced. Left Atrium: Left atrial size was normal in size. No left atrial/left atrial appendage thrombus was detected. The LAA emptying velocity was 62 cm/s. Right Atrium: Right atrial size  was normal in size. Pericardium: There is no evidence of pericardial effusion. Mitral Valve: Multiple regurgitant jets. The mitral valve is normal in structure. Normal mobility of the mitral valve leaflets. Mild mitral valve regurgitation. No evidence of mitral valve stenosis. Tricuspid Valve: The tricuspid valve is normal in structure. Tricuspid valve regurgitation is mild to moderate. No evidence of tricuspid stenosis. Aortic Valve: The aortic valve is tricuspid. Aortic valve regurgitation is mild to moderate. No aortic stenosis is present. Pulmonic Valve: The pulmonic valve was normal in structure. Pulmonic valve regurgitation is trivial. No evidence of pulmonic stenosis. Aorta: The aortic root is normal in size and structure. There is mild (Grade II) layered plaque involving the transverse aorta and descending aorta. Venous: The inferior vena cava is normal in size with greater than 50% respiratory variability, suggesting right atrial pressure of 3 mmHg. IAS/Shunts: No atrial level shunt detected by color flow Doppler.  LEFT VENTRICLE PLAX 2D LVOT diam:     2.10 cm LVOT Area:     3.46 cm  MR Peak grad:    104.9 mmHg MR Mean grad:    75.0 mmHg   SHUNTS MR Vmax:         512.00 cm/s Systemic Diam: 2.10 cm MR Vmean:        419.0 cm/s MR PISA:         1.57 cm MR PISA Eff ROA: 9 mm MR PISA Radius:  0.50 cm Skeet Latch MD Electronically signed by Skeet Latch MD Signature Date/Time: 10/16/2019/10:57:11 AM    Final    DG Chest Port 1 View  Final Result       Scheduled Meds:  apixaban  2.5 mg Oral BID   Chlorhexidine Gluconate Cloth  6 each Topical Q0600   ferrous sulfate  325 mg Oral Q breakfast   furosemide  20 mg Oral Daily   loratadine  10 mg Oral Daily   metoprolol succinate  75 mg Oral QHS   mupirocin ointment  1 application Nasal BID   PRN Meds: acetaminophen, guaiFENesin-dextromethorphan, ipratropium-albuterol, LORazepam, metoprolol tartrate, ondansetron (ZOFRAN) IV Continuous  Infusions:  amiodarone        LOS: 4 days  Time spent: Greater than 50% of the 35 minute visit was spent in counseling/coordination of care for the patient as laid out in the A&P.   Vashti Hey, MD [facility] 10/16/2019, 5:16 PM   Contact via secure chat.  To contact the attending provider between 7A-7P or the covering provider during after hours 7P-7A, please log into the web site [email or web address] and access using universal [place] password for that web site. If you do not have the password, please call the hospital operator.

## 2019-10-16 NOTE — Anesthesia Postprocedure Evaluation (Signed)
Anesthesia Post Note  Patient: Shirley Bates  Procedure(s) Performed: TRANSESOPHAGEAL ECHOCARDIOGRAM (TEE) (N/A ) CARDIOVERSION (N/A )     Patient location during evaluation: Endoscopy Anesthesia Type: General Level of consciousness: awake Pain management: pain level controlled Vital Signs Assessment: post-procedure vital signs reviewed and stable Respiratory status: spontaneous breathing Cardiovascular status: stable Postop Assessment: no apparent nausea or vomiting Anesthetic complications: no   No complications documented.  Last Vitals:  Vitals:   10/16/19 1040 10/16/19 1057  BP: 125/72   Pulse: 71 67  Resp: 19 19  Temp:  (!) 36.4 C  SpO2: 98% 98%    Last Pain:  Vitals:   10/16/19 1057  TempSrc: Oral  PainSc:                  Milos Milligan

## 2019-10-16 NOTE — Progress Notes (Signed)
Physical Therapy Treatment Patient Details Name: Shirley Bates MRN: [id number] DOB: 02-11-1926 Today's Date: 10/16/2019    History of Present Illness 86 yo female with onset of a-fib with RVR was admitted, noted pt in CHF w fluid overload.  Suspected of having UTI, LVEF was 40-45% yesterday.  PMHx:  dementia, UTI, e-coli, HTN, ALF resident    PT Comments    Pt sitting EoB finishing lunch with daughter in room, agreeable to working with therapy but has complaints of 10/10 pain in her L LE which has just happened since she got up for lunch. Pt limited in safe mobility today by her pain and generalized weakness. Pt attempted sit>stand x8 and was unable to tolerate due to increased pain and will not attempt to bear weight through L LE. Pt reports having to go to the bathroom but can not walk in there so BSC brought to her. Pt able to stand and ambulate to [facility] still with 10/10 pain but without buckling or decreased weightshift to L. After using BSC pt with request to return to bed. Placed pt in bed in chair position to provide more back support to hopefully aid in decrease of L LE pain. RN notified. D/c plans remain appropriate at this time. PT will continue to follow acutely.  Follow Up Recommendations  Home health PT;Supervision - Intermittent     Equipment Recommendations  Rolling walker with 5" wheels       Precautions / Restrictions Precautions Precautions: Fall Precaution Comments: monitor HR and BP Restrictions Weight Bearing Restrictions: No    Mobility  Bed Mobility Overal bed mobility: Needs Assistance Bed Mobility: Sit to Supine       Sit to supine: Min assist   General bed mobility comments: minA for management of hips to center of bed once pt has brought LE back into bed  Transfers Overall transfer level: Needs assistance Equipment used: Rolling walker (2 wheeled) Transfers: Sit to/from [facility] Sit to Stand: Min guard;From elevated surface          General transfer comment: min guard for sit>stand x 8, pt refuses physical assist, but 10/10 pain in L LE without weightbearigng prohibits progression of gait, pt finally agreeable to transfer to [facility]    Ambulation/Gait Ambulation/Gait assistance: Min assist Gait Distance (Feet): 3 Feet Assistive device: Rolling walker (2 wheeled);1 person hand held assist Gait Pattern/deviations: Step-to pattern;Wide base of support (sidesteps) Gait velocity: reduced Gait velocity interpretation: <1.31 ft/sec, indicative of household ambulator General Gait Details: minA for steadying with stepping transfer from bed to [facility] and back to bed, pt with c/o of L knee/calf pain with and without weightbearing during gait,          Balance Overall balance assessment: Needs assistance Sitting-balance support: Feet supported Sitting balance-Leahy Scale: Good     Standing balance support: Bilateral upper extremity supported;During functional activity Standing balance-Leahy Scale: Poor Standing balance comment: requires B UE to balance because pt with decreased L LE weightbearing                             Cognition Arousal/Alertness: Awake/alert Behavior During Therapy: WFL for tasks assessed/performed Overall Cognitive Status: History of cognitive impairments - at baseline                                 General Comments: pleasant, conversive, funny, repeats herself, requires  multiple cues         General Comments General comments (skin integrity, edema, etc.): Pt on 2L O2 via [place] on entry, SaO2 100%O2 does not use at home so removed and pt was able to maintain SaO2 >90%O2, VSS       Pertinent Vitals/Pain Pain Assessment: No/denies pain           PT Goals (current goals can now be found in the care plan section) Acute Rehab PT Goals Patient Stated Goal: get back home and keep moving PT Goal Formulation: With patient/family Time For Goal Achievement:  10/27/19 Potential to Achieve Goals: Good Progress towards PT goals: Not progressing toward goals - comment (10/10 pain in L LE etiology unknown)    Frequency    Min 3X/week      PT Plan Discharge plan needs to be updated       AM-PAC PT "6 Clicks" Mobility   Outcome Measure  Help needed turning from your back to your side while in a flat bed without using bedrails?: A Little Help needed moving from lying on your back to sitting on the side of a flat bed without using bedrails?: A Lot Help needed moving to and from a bed to a chair (including a wheelchair)?: A Little Help needed standing up from a chair using your arms (e.g., wheelchair or bedside chair)?: A Little Help needed to walk in hospital room?: A Little Help needed climbing 3-5 steps with a railing? : Total 6 Click Score: 15    End of Session Equipment Utilized During Treatment: Gait belt Activity Tolerance: Patient limited by fatigue;Treatment limited secondary to medical complications (Comment) Patient left: in bed;with call bell/phone within reach;with bed alarm set;with family/visitor present Nurse Communication: Mobility status PT Visit Diagnosis: Unsteadiness on feet (R26.81);Muscle weakness (generalized) (M62.81);Difficulty in walking, not elsewhere classified (R26.2)     Time: 7096-2836 PT Time Calculation (min) (ACUTE ONLY): 38 min  Charges:  $Therapeutic Activity: 23-37 mins                     Eryka Dolinger B. Migdalia Dk PT, DPT Acute Rehabilitation Services Pager [phone] Office [phone]    [place] 10/16/2019, 3:34 PM

## 2019-10-16 NOTE — Interval H&P Note (Signed)
History and Physical Interval Note:  10/16/2019 8:53 AM  Shirley Bates  has presented today for surgery, with the diagnosis of afib.  The various methods of treatment have been discussed with the patient and family. After consideration of risks, benefits and other options for treatment, the patient has consented to  Procedure(s): TRANSESOPHAGEAL ECHOCARDIOGRAM (TEE) (N/A) CARDIOVERSION (N/A) as a surgical intervention.  The patient's history has been reviewed, patient examined, no change in status, stable for surgery.  I have reviewed the patient's chart and labs.  Questions were answered to the patient's satisfaction.     Skeet Latch, MD

## 2019-10-16 NOTE — Anesthesia Procedure Notes (Signed)
Procedure Name: General with mask airway Date/Time: 10/16/2019 9:30 AM Performed by: Jenne Campus, CRNA Pre-anesthesia Checklist: Patient identified, Emergency Drugs available, Suction available and Patient being monitored Oxygen Delivery Method: Ambu bag

## 2019-10-16 NOTE — TOC Benefit Eligibility Note (Signed)
Transition of Care [facility]) Benefit Eligibility Note    Patient Details  Name: Shirley Bates MRN: [id number] Date of Birth: 01-15-1926   Medication/Dose: Arne [place]   2.5 MG  BID  Covered?: Yes  Tier: 3 Drug  Prescription Coverage Preferred Pharmacy: [facility] with Person/Company/Phone Number:: MACAYLA  @  [employer] # [id number]  Co-Pay: $9.20  Prior Approval: No  Deductible:  (UNAVAILABLE)       [facility] [place] Phone Number: 10/16/2019, 1:30 PM

## 2019-10-16 NOTE — Transfer of Care (Signed)
Immediate Anesthesia Transfer of Care Note  Patient: Shirley Bates  Procedure(s) Performed: TRANSESOPHAGEAL ECHOCARDIOGRAM (TEE) (N/A ) CARDIOVERSION (N/A )  Patient Location: Endoscopy Unit  Anesthesia Type:General  Level of Consciousness: awake, oriented and patient cooperative  Airway & Oxygen Therapy: Patient Spontanous Breathing and Patient connected to nasal cannula oxygen  Post-op Assessment: Report given to RN and Post -op Vital signs reviewed and stable  Post vital signs: Reviewed  Last Vitals:  Vitals Value Taken Time  BP    Temp    Pulse    Resp    SpO2      Last Pain:  Vitals:   10/16/19 0907  TempSrc: Temporal  PainSc: 0-No pain      Patients Stated Pain Goal: 0 (60/67/70 3403)  Complications: No complications documented.

## 2019-10-16 NOTE — Anesthesia Preprocedure Evaluation (Addendum)
Anesthesia Evaluation  Patient identified by MRN, date of birth, ID band Patient awake    Reviewed: Allergy & Precautions, NPO status , Patient's Chart, lab work & pertinent test results  Airway Mallampati: II  TM Distance: >3 FB Neck ROM: Full    Dental  (+) Dental Advisory Given, Lower Dentures, Partial Upper   Pulmonary    breath sounds clear to auscultation       Cardiovascular hypertension, + dysrhythmias Atrial Fibrillation  Rhythm:Irregular Rate:Tachycardia     Neuro/Psych    GI/Hepatic negative GI ROS, Neg liver ROS,   Endo/Other  negative endocrine ROS  Renal/GU negative Renal ROS     Musculoskeletal  (+) Arthritis ,   Abdominal   Peds  Hematology  (+) anemia ,   Anesthesia Other Findings   Reproductive/Obstetrics                            Anesthesia Physical Anesthesia Plan  ASA: III  Anesthesia Plan: General   Post-op Pain Management:    Induction:   PONV Risk Score and Plan: 2 and Propofol infusion and Ondansetron  Airway Management Planned: Nasal Cannula and Simple Face Mask  Additional Equipment:   Intra-op Plan:   Post-operative Plan:   Informed Consent: I have reviewed the patients History and Physical, chart, labs and discussed the procedure including the risks, benefits and alternatives for the proposed anesthesia with the patient or authorized representative who has indicated his/her understanding and acceptance.     Dental advisory given  Plan Discussed with: CRNA and Anesthesiologist  Anesthesia Plan Comments:        Anesthesia Quick Evaluation

## 2019-10-16 NOTE — Progress Notes (Signed)
  Echocardiogram Echocardiogram Transesophageal has been performed.  Jennette Dubin 10/16/2019, 10:13 AM

## 2019-10-16 NOTE — CV Procedure (Addendum)
Brief TEE Note  LVEF 30-35% Moderately reduced right ventricular systolic function Mild MR.  Multiple jets Mild-moderate TR and AR Plaque noted in the descending aorta and aortic arch No LA/LAA thrombus or mass   Electrical Cardioversion Procedure Note ANTOINETT DORMAN [id number] 10-27-1925  Procedure: Electrical Cardioversion Indications:  Atrial Fibrillation  Procedure Details Consent: Risks of procedure as well as the alternatives and risks of each were explained to the (patient/caregiver).  Consent for procedure obtained. Time Out: Verified patient identification, verified procedure, site/side was marked, verified correct patient position, special equipment/implants available, medications/allergies/relevent history reviewed, required imaging and test results available.  Performed  Patient placed on cardiac monitor, pulse oximetry, supplemental oxygen as necessary.  Sedation given: prop Pacer pads placed anterior and posterior chest.  Cardioverted 1 time(s).  Cardioverted at 120J.  Evaluation Findings: Post procedure EKG shows: initially atrial fibrillation followed by sinus rhythm with frequent runs of atrial fibrillation Complications: None Patient did tolerate procedure well.  Patient received 150mg  amiodarone bolus after the infusion due to frequent runs of atrial fibrillation.   Skeet Latch, MD 10/16/2019, 9:57 AM

## 2019-10-16 NOTE — Care Management Important Message (Signed)
Important Message  Patient Details  Name: Shirley Bates MRN: [id number] Date of Birth: 1925/11/18   Medicare Important Message Given:  Yes     Shelda Altes 10/16/2019, 10:55 AM

## 2019-10-16 NOTE — Progress Notes (Addendum)
Progress Note  Patient Name: Shirley Bates Date of Encounter: 10/16/2019  [facility] Cardiologist: Glenetta Hew, MD  Subjective   Sleepy, did not respond to questions, however clearly following command. Able to move and squeeze hand on command.   Inpatient Medications    Scheduled Meds:  apixaban  2.5 mg Oral BID   Chlorhexidine Gluconate Cloth  6 each Topical Q0600   diltiazem  120 mg Oral Daily   ferrous sulfate  325 mg Oral Q breakfast   furosemide  20 mg Oral Daily   loratadine  10 mg Oral Daily   metoprolol tartrate  25 mg Oral BID   mupirocin ointment  1 application Nasal BID   Continuous Infusions:  amiodarone     amiodarone     amiodarone     PRN Meds: acetaminophen, guaiFENesin-dextromethorphan, ipratropium-albuterol, LORazepam, metoprolol tartrate, ondansetron (ZOFRAN) IV   Vital Signs    Vitals:   10/16/19 0911 10/16/19 1017 10/16/19 1030 10/16/19 1040  BP: (!) 143/93 115/67 111/65 125/72  Pulse: (!) 114  68 71  Resp: 18 22 20 19   Temp:      TempSrc:      SpO2: 100% 97% 98% 98%  Weight:      Height:        Intake/Output Summary (Last 24 hours) at 10/16/2019 1052 Last data filed at 10/16/2019 1009 Gross per 24 hour  Intake 560 ml  Output 450 ml  Net 110 ml   Last 3 Weights 10/16/2019 10/16/2019 10/15/2019  Weight (lbs) 115 lb 126 lb 1.7 oz 126 lb 4.8 oz  Weight (kg) 52.164 kg 57.2 kg 57.29 kg      Telemetry    NSR with frequent PACs and few PVCs - Personally Reviewed  ECG    NSR with PACs and PVCs - Personally Reviewed  Physical Exam   GEN: Sleepy, did not respond to questions Neck: No JVD Cardiac: RRR, no murmurs, rubs, or gallops.  Respiratory: Clear to auscultation bilaterally. GI: Soft, nontender, non-distended  MS: No edema; No deformity. Neuro:  able to move on command. Psych: Normal affect   Labs    High Sensitivity Troponin:   Recent Labs  Lab 10/12/19 1305 10/12/19 1550  TROPONINIHS 20* 22*       Chemistry Recent Labs  Lab 10/12/19 1305 10/12/19 1305 10/13/19 1156 10/14/19 0527 10/15/19 0352  NA 134*   < > 135 135 132*  K 4.6   < > 3.8 3.7 3.8  CL 102   < > 98 97* 95*  CO2 21*   < > 26 29 29   GLUCOSE 109*   < > 153* 107* 101*  BUN 19   < > 17 17 19   CREATININE 0.81   < > 0.97 0.98 0.82  CALCIUM 8.7*   < > 8.4* 8.2* 8.2*  PROT 6.4*  --   --   --   --   ALBUMIN 2.9*  --   --   --   --   AST 37  --   --   --   --   ALT 36  --   --   --   --   ALKPHOS 75  --   --   --   --   BILITOT 0.6  --   --   --   --   GFRNONAA >60   < > 51* 50* >60  GFRAA >60   < > 59* 58* >60  ANIONGAP 11   < >  11 9 8    < > = values in this interval not displayed.     Hematology Recent Labs  Lab 10/13/19 1156 10/14/19 0527 10/15/19 0352  WBC 6.6 6.9 6.3  RBC 3.66* 3.48* 3.57*  HGB 11.0* 10.2* 10.7*  HCT 33.1* 31.1* 32.1*  MCV 90.4 89.4 89.9  MCH 30.1 29.3 30.0  MCHC 33.2 32.8 33.3  RDW 15.1 14.9 14.8  PLT 307 315 329    BNP Recent Labs  Lab 10/12/19 1550  BNP 838.8*     DDimer No results for input(s): DDIMER in the last 168 hours.   Radiology    No results found.  Cardiac Studies   TEE 10/16/2019 1. Left ventricular ejection fraction, by estimation, is 30 to 35%. The  left ventricle has moderately decreased function. The left ventricle  demonstrates global hypokinesis.   2. Right ventricular systolic function is moderately reduced. The right  ventricular size is normal.   3. No left atrial/left atrial appendage thrombus was detected. The LAA  emptying velocity was 62 cm/s.   4. Multiple regurgitant jets. The mitral valve is normal in structure.  Mild mitral valve regurgitation. No evidence of mitral stenosis.   5. Tricuspid valve regurgitation is mild to moderate.   6. The aortic valve is tricuspid. Aortic valve regurgitation is mild to  moderate. No aortic stenosis is present.   7. There is mild (Grade II) layered plaque involving the transverse aorta  and  descending aorta.   8. The inferior vena cava is normal in size with greater than 50%  respiratory variability, suggesting right atrial pressure of 3 mmHg. 1. Left ventricular ejection fraction, by estimation, is 30 to 35%. The  left ventricle has moderately decreased function. The left ventricle  demonstrates global hypokinesis.   2. Right ventricular systolic function is moderately reduced. The right  ventricular size is normal.   3. No left atrial/left atrial appendage thrombus was detected. The LAA  emptying velocity was 62 cm/s.   4. Multiple regurgitant jets. The mitral valve is normal in structure.  Mild mitral valve regurgitation. No evidence of mitral stenosis.   5. Tricuspid valve regurgitation is mild to moderate.   6. The aortic valve is tricuspid. Aortic valve regurgitation is mild to  moderate. No aortic stenosis is present.   7. There is mild (Grade II) layered plaque involving the transverse aorta  and descending aorta.   8. The inferior vena cava is normal in size with greater than 50%  respiratory variability, suggesting right atrial pressure of 3 mmHg.   Patient Profile     86 y.o. female with PMH of dementia, HTN, and HLD presented with dyspnea and found to be in acute CHF and afib with RVR.   Assessment & Plan    1. Acute combined systolic and diastolic CHF  - I/O -3L, weight not accurate.    - Echo shows EF 40-45%. TSH normal.   - on metoprolol, diltiazem and Eliquis. Consolidate by stop diltiazem CD 120mg  (contraindicated given her LV dysfunction) and switch metoprolol tartrate 25mg  BID to toprol XL 75mg  daily starting tonight.   - continue 20mg  daily lasix. Likely discharge tomorrow.   2. Newly diagnosed atrial fibrillation with RVR  - given difficult to control HR and hypotension, she underwent TEE DCCV on 7/27, post procedure she had frequent ectopy and was in and out of afib, therefore she was give amiodarone bolus and started on amiodarone gtt  3. Demand  ischemia: trop borderline elevated but  remained flat. Suspect demand ischemia in the setting of afib  4. HTN: BP improved after cardioversion  5. HLD  6. H/o fall related to recurrent UTIs  7. Dementia      For questions or updates, please contact [facility] Please consult [email or web address] for contact info under        Signed, Almyra Deforest, [place]  10/16/2019, 10:52 AM     Agree with note by Almyra Deforest PA-C  S/P successful TEE DCCV to SR. On Amio drip. BB adjusted. HR currently 60. On Eliquis. Will transition to PO Amio tomorrow. Potential DC home next 24-48 hours.  Lorretta Harp, M.D., [place], [facility], Laver[place] [address] [place], [place]  [address]  [phone] 10/16/2019 1:44 PM

## 2019-10-17 ENCOUNTER — Encounter (HOSPITAL_COMMUNITY): Payer: Self-pay | Admitting: Cardiovascular Disease

## 2019-10-17 DIAGNOSIS — R8271 Bacteriuria: Secondary | ICD-10-CM | POA: Diagnosis not present

## 2019-10-17 DIAGNOSIS — I5041 Acute combined systolic (congestive) and diastolic (congestive) heart failure: Secondary | ICD-10-CM | POA: Diagnosis not present

## 2019-10-17 DIAGNOSIS — I10 Essential (primary) hypertension: Secondary | ICD-10-CM | POA: Diagnosis not present

## 2019-10-17 DIAGNOSIS — I4891 Unspecified atrial fibrillation: Secondary | ICD-10-CM | POA: Diagnosis not present

## 2019-10-17 LAB — CBC WITH DIFFERENTIAL/PLATELET
Abs Immature Granulocytes: 0.07 10*3/uL (ref 0.00–0.07)
Basophils Absolute: 0 10*3/uL (ref 0.0–0.1)
Basophils Relative: 0 %
Eosinophils Absolute: 0 10*3/uL (ref 0.0–0.5)
Eosinophils Relative: 0 %
HCT: 32.6 % — ABNORMAL LOW (ref 36.0–46.0)
Hemoglobin: 11 g/dL — ABNORMAL LOW (ref 12.0–15.0)
Immature Granulocytes: 1 %
Lymphocytes Relative: 7 %
Lymphs Abs: 0.7 10*3/uL (ref 0.7–4.0)
MCH: 30.1 pg (ref 26.0–34.0)
MCHC: 33.7 g/dL (ref 30.0–36.0)
MCV: 89.1 fL (ref 80.0–100.0)
Monocytes Absolute: 0.7 10*3/uL (ref 0.1–1.0)
Monocytes Relative: 8 %
Neutro Abs: 8 10*3/uL — ABNORMAL HIGH (ref 1.7–7.7)
Neutrophils Relative %: 84 %
Platelets: 324 10*3/uL (ref 150–400)
RBC: 3.66 MIL/uL — ABNORMAL LOW (ref 3.87–5.11)
RDW: 14.8 % (ref 11.5–15.5)
WBC: 9.5 10*3/uL (ref 4.0–10.5)
nRBC: 0 % (ref 0.0–0.2)

## 2019-10-17 LAB — BASIC METABOLIC PANEL
Anion gap: 9 (ref 5–15)
BUN: 20 mg/dL (ref 8–23)
CO2: 26 mmol/L (ref 22–32)
Calcium: 8.6 mg/dL — ABNORMAL LOW (ref 8.9–10.3)
Chloride: 91 mmol/L — ABNORMAL LOW (ref 98–111)
Creatinine, Ser: 0.74 mg/dL (ref 0.44–1.00)
GFR calc Af Amer: >60 mL/min (ref >60)
GFR calc non Af Amer: >60 mL/min (ref >60)
Glucose, Bld: 190 mg/dL — ABNORMAL HIGH (ref 70–99)
Potassium: 4.3 mmol/L (ref 3.5–5.1)
Sodium: 126 mmol/L — ABNORMAL LOW (ref 135–145)

## 2019-10-17 LAB — MAGNESIUM: Magnesium: 1.8 mg/dL (ref 1.7–2.4)

## 2019-10-17 LAB — SARS CORONAVIRUS 2 (TAT 6-24 HRS): SARS Coronavirus 2: NEGATIVE

## 2019-10-17 MED ORDER — APIXABAN 2.5 MG PO TABS: 2.5000 mg | ORAL_TABLET | Freq: Two times a day (BID) | ORAL | 0 refills | Status: DC

## 2019-10-17 MED ORDER — LOSARTAN POTASSIUM 25 MG PO TABS
25.0000 mg | ORAL_TABLET | Freq: Every day | ORAL | Status: DC
  Administered 2019-10-17: 25 mg via ORAL
  Filled 2019-10-17: qty 1

## 2019-10-17 MED ORDER — MAGNESIUM SULFATE 2 GM/50ML IV SOLN
2.0000 g | Freq: Once | INTRAVENOUS | Status: AC
  Administered 2019-10-17: 2 g via INTRAVENOUS
  Filled 2019-10-17: qty 50

## 2019-10-17 MED ORDER — FERROUS SULFATE 325 (65 FE) MG PO TABS: 325.0000 mg | ORAL_TABLET | Freq: Every day | ORAL | 0 refills | Status: DC

## 2019-10-17 MED ORDER — AMIODARONE HCL 200 MG PO TABS
200.0000 mg | ORAL_TABLET | Freq: Two times a day (BID) | ORAL | Status: DC
  Administered 2019-10-17: 200 mg via ORAL
  Filled 2019-10-17 (×2): qty 1

## 2019-10-17 MED ORDER — LOSARTAN POTASSIUM 25 MG PO TABS: 25.0000 mg | ORAL_TABLET | Freq: Every day | ORAL | 0 refills | Status: DC

## 2019-10-17 MED ORDER — AMIODARONE HCL 200 MG PO TABS: ORAL_TABLET | ORAL | 0 refills | Status: DC

## 2019-10-17 MED ORDER — METOPROLOL SUCCINATE ER 25 MG PO TB24: 75.0000 mg | ORAL_TABLET | Freq: Every day | ORAL | 0 refills | Status: DC

## 2019-10-17 MED ORDER — FUROSEMIDE 20 MG PO TABS: 20.0000 mg | ORAL_TABLET | Freq: Every day | ORAL | 0 refills | Status: DC

## 2019-10-17 MED ORDER — AMIODARONE HCL 200 MG PO TABS: 200.0000 mg | ORAL_TABLET | Freq: Every day | ORAL | Status: DC

## 2019-10-17 MED ORDER — NONFORMULARY OR COMPOUNDED ITEM: 1.0000 "application " | Freq: Every day | Status: AC

## 2019-10-17 MED ORDER — GUAIFENESIN-DM 100-10 MG/5ML PO SYRP: 5.0000 mL | ORAL_SOLUTION | ORAL | 0 refills | Status: AC | PRN

## 2019-10-17 NOTE — Progress Notes (Addendum)
Progress Note  Patient Name: Shirley Bates Date of Encounter: 10/17/2019  [facility] Cardiologist: Glenetta Hew, MD   Subjective   TEE-DCCV yesterday with conversion to sinus rhythm, but runs of Afib on the floor prompting amiodarone IV administration.  No complaints today.  Inpatient Medications    Scheduled Meds:  apixaban  2.5 mg Oral BID   Chlorhexidine Gluconate Cloth  6 each Topical Q0600   ferrous sulfate  325 mg Oral Q breakfast   furosemide  20 mg Oral Daily   loratadine  10 mg Oral Daily   metoprolol succinate  75 mg Oral QHS   mupirocin ointment  1 application Nasal BID   Continuous Infusions:  amiodarone 30 mg/hr (10/17/19 0530)   PRN Meds: acetaminophen, guaiFENesin-dextromethorphan, ipratropium-albuterol, LORazepam, metoprolol tartrate, ondansetron (ZOFRAN) IV   Vital Signs    Vitals:   10/16/19 2002 10/17/19 0040 10/17/19 0434 10/17/19 0500  BP: 124/65 (!) 141/74 (!) 148/72   Pulse: 64 60 60   Resp: 18 17 16    Temp: 97.8 F (36.6 C) 97.9 F (36.6 C) 97.8 F (36.6 C)   TempSrc: Oral Oral Oral   SpO2: 97% 91% 92%   Weight:    53.8 kg  Height:        Intake/Output Summary (Last 24 hours) at 10/17/2019 9371 Last data filed at 10/16/2019 2000 Gross per 24 hour  Intake 800 ml  Output --  Net 800 ml   Last 3 Weights 10/17/2019 10/16/2019 10/16/2019  Weight (lbs) 118 lb 9.6 oz 115 lb 126 lb 1.7 oz  Weight (kg) 53.797 kg 52.164 kg 57.2 kg      Telemetry    Sinus rhythm with PACs - Personally Reviewed  ECG    10/16/19: sinus rhythm with PVCs - Personally Reviewed  Physical Exam   GEN: elderly female, no acute distress.   Neck: No JVD Cardiac: RRR, no murmurs, rubs, or gallops.  Respiratory: Clear to auscultation bilaterally. GI: Soft, nontender, non-distended  MS: No edema; No deformity. Neuro:  Nonfocal  Psych: Normal affect   Labs    High Sensitivity Troponin:   Recent Labs  Lab 10/12/19 1305 10/12/19 1550  TROPONINIHS 20*  22*      Chemistry Recent Labs  Lab 10/12/19 1305 10/13/19 1156 10/14/19 0527 10/15/19 0352 10/16/19 1058  NA 134*   < > 135 132* 131*  K 4.6   < > 3.7 3.8 4.2  CL 102   < > 97* 95* 96*  CO2 21*   < > 29 29 25   GLUCOSE 109*   < > 107* 101* 111*  BUN 19   < > 17 19 15   CREATININE 0.81   < > 0.98 0.82 0.69  CALCIUM 8.7*   < > 8.2* 8.2* 8.1*  PROT 6.4*  --   --   --   --   ALBUMIN 2.9*  --   --   --   --   AST 37  --   --   --   --   ALT 36  --   --   --   --   ALKPHOS 75  --   --   --   --   BILITOT 0.6  --   --   --   --   GFRNONAA >60   < > 50* >60 >60  GFRAA >60   < > 58* >60 >60  ANIONGAP 11   < > 9 8 10    < > =  values in this interval not displayed.     Hematology Recent Labs  Lab 10/14/19 0527 10/15/19 0352 10/16/19 1058  WBC 6.9 6.3 6.7  RBC 3.48* 3.57* 3.92  HGB 10.2* 10.7* 11.7*  HCT 31.1* 32.1* 35.6*  MCV 89.4 89.9 90.8  MCH 29.3 30.0 29.8  MCHC 32.8 33.3 32.9  RDW 14.9 14.8 15.1  PLT 315 329 314    BNP Recent Labs  Lab 10/12/19 1550  BNP 838.8*     DDimer No results for input(s): DDIMER in the last 168 hours.   Radiology    ECHO TEE  Result Date: 10/16/2019    TRANSESOPHOGEAL ECHO REPORT   Patient Name:   Shirley Bates Date of Exam: 10/16/2019 Medical Rec #:  [id number]     Height:       61.3 in Accession #:    [id number]    Weight:       126.1 lb Date of Birth:  [birth year]     BSA:          1.558 m Patient Age:    86 years      BP:           143/93 mmHg Patient Gender: F             HR:           126 bpm. Exam Location:  Inpatient Procedure: Transesophageal Echo, Color Doppler and Cardiac Doppler Indications:     Atrial Fibrillation  History:         Patient has prior history of Echocardiogram examinations, most                  recent 10/12/2019. Risk Factors:Hypertension and Dyslipidemia.  Sonographer:     Mikki Santee RDCS (AE) Referring Phys:  [id number] Tami Lin [facility] Diagnosing Phys: Skeet Latch MD PROCEDURE: The transesophogeal  probe was passed without difficulty through the esophogus of the patient. Sedation performed by different physician. The patient was monitored while under deep sedation. Anesthestetic sedation was provided intravenously by Anesthesiology: 62.64mg  of Propofol. The patient's vital signs; including heart rate, blood pressure, and oxygen saturation; remained stable throughout the procedure. The patient developed no complications during the procedure. A direct current cardioversion was performed at 120 joules with 1 attempt. IMPRESSIONS  1. Left ventricular ejection fraction, by estimation, is 30 to 35%. The left ventricle has moderately decreased function. The left ventricle demonstrates global hypokinesis.  2. Right ventricular systolic function is moderately reduced. The right ventricular size is normal.  3. No left atrial/left atrial appendage thrombus was detected. The LAA emptying velocity was 62 cm/s.  4. Multiple regurgitant jets. The mitral valve is normal in structure. Mild mitral valve regurgitation. No evidence of mitral stenosis.  5. Tricuspid valve regurgitation is mild to moderate.  6. The aortic valve is tricuspid. Aortic valve regurgitation is mild to moderate. No aortic stenosis is present.  7. There is mild (Grade II) layered plaque involving the transverse aorta and descending aorta.  8. The inferior vena cava is normal in size with greater than 50% respiratory variability, suggesting right atrial pressure of 3 mmHg. Conclusion(s)/Recommendation(s): Normal biventricular function without evidence of hemodynamically significant valvular heart disease. FINDINGS  Left Ventricle: Left ventricular ejection fraction, by estimation, is 30 to 35%. The left ventricle has moderately decreased function. The left ventricle demonstrates global hypokinesis. The left ventricular internal cavity size was normal in size. There is no left ventricular hypertrophy. Right Ventricle: The right ventricular size is  normal. No  increase in right ventricular wall thickness. Right ventricular systolic function is moderately reduced. Left Atrium: Left atrial size was normal in size. No left atrial/left atrial appendage thrombus was detected. The LAA emptying velocity was 62 cm/s. Right Atrium: Right atrial size was normal in size. Pericardium: There is no evidence of pericardial effusion. Mitral Valve: Multiple regurgitant jets. The mitral valve is normal in structure. Normal mobility of the mitral valve leaflets. Mild mitral valve regurgitation. No evidence of mitral valve stenosis. Tricuspid Valve: The tricuspid valve is normal in structure. Tricuspid valve regurgitation is mild to moderate. No evidence of tricuspid stenosis. Aortic Valve: The aortic valve is tricuspid. Aortic valve regurgitation is mild to moderate. No aortic stenosis is present. Pulmonic Valve: The pulmonic valve was normal in structure. Pulmonic valve regurgitation is trivial. No evidence of pulmonic stenosis. Aorta: The aortic root is normal in size and structure. There is mild (Grade II) layered plaque involving the transverse aorta and descending aorta. Venous: The inferior vena cava is normal in size with greater than 50% respiratory variability, suggesting right atrial pressure of 3 mmHg. IAS/Shunts: No atrial level shunt detected by color flow Doppler.  LEFT VENTRICLE PLAX 2D LVOT diam:     2.10 cm LVOT Area:     3.46 cm  MR Peak grad:    104.9 mmHg MR Mean grad:    75.0 mmHg   SHUNTS MR Vmax:         512.00 cm/s Systemic Diam: 2.10 cm MR Vmean:        419.0 cm/s MR PISA:         1.57 cm MR PISA Eff ROA: 9 mm MR PISA Radius:  0.50 cm Skeet Latch MD Electronically signed by Skeet Latch MD Signature Date/Time: 10/16/2019/10:57:11 AM    Final     Cardiac Studies   TEE 10/16/2019 1. Left ventricular ejection fraction, by estimation, is 30 to 35%. The  left ventricle has moderately decreased function. The left ventricle  demonstrates global  hypokinesis.   2. Right ventricular systolic function is moderately reduced. The right  ventricular size is normal.   3. No left atrial/left atrial appendage thrombus was detected. The LAA  emptying velocity was 62 cm/s.   4. Multiple regurgitant jets. The mitral valve is normal in structure.  Mild mitral valve regurgitation. No evidence of mitral stenosis.   5. Tricuspid valve regurgitation is mild to moderate.   6. The aortic valve is tricuspid. Aortic valve regurgitation is mild to  moderate. No aortic stenosis is present.   7. There is mild (Grade II) layered plaque involving the transverse aorta  and descending aorta.   8. The inferior vena cava is normal in size with greater than 50%  respiratory variability, suggesting right atrial pressure of 3 mmHg. 1. Left ventricular ejection fraction, by estimation, is 30 to 35%. The  left ventricle has moderately decreased function. The left ventricle  demonstrates global hypokinesis.   2. Right ventricular systolic function is moderately reduced. The right  ventricular size is normal.   3. No left atrial/left atrial appendage thrombus was detected. The LAA  emptying velocity was 62 cm/s.   4. Multiple regurgitant jets. The mitral valve is normal in structure.  Mild mitral valve regurgitation. No evidence of mitral stenosis.   5. Tricuspid valve regurgitation is mild to moderate.   6. The aortic valve is tricuspid. Aortic valve regurgitation is mild to  moderate. No aortic stenosis is present.   7. There  is mild (Grade II) layered plaque involving the transverse aorta  and descending aorta.   8. The inferior vena cava is normal in size with greater than 50%  respiratory variability, suggesting right atrial pressure of 3 mmHg.   Patient Profile     86 y.o. female with PMH of dementia, HTN, and HLD presented with dyspnea and found to be in acute CHF and afib with RVR.   Assessment & Plan    Acute combined systolic and diastolic heart  failure - EF 30-35% on TEE, down from TTE  - stopped cardizem - continue toprol 75 mg - restart low dose losartan today - 25 mg - repeat echo in 3 months - discussed low sodium diet and daily weights (if able at the facility)   Atrial fibrillation with RVR - new diagnosis - TEE-guided DCCV yesterday with conversion to sinus rhythm, but had runs of Afib following procedure - amiodarone started yesterday - IV load completed today, transition to 200 mg BID x 7 days, then 200 mg daily - she is having PACs on telemetry, but appears to have maintained NSR  - continue eliquis 2.5 mg BID (age and weight) This patients CHA2DS2-VASc Score and unadjusted Ischemic Stroke Rate (% per year) is equal to 7.2 % stroke rate/year from a score of 5 (CHF, HTN, 2age) - Mg 1.8, will supplement with gentle IV Mg    Demand ischemia - hs troponin 20 --> 22 - suspect his is due to new onset heart failure and RVR - no ischemic workup at this time   HTN - home losartan 100 mg has been on hold - pressure much better with restoration of sinus rhythm - will restart 25 mg losartan today   HLD 10/13/2019: Cholesterol 118; HDL 43; LDL Cholesterol 66; Triglycerides 46; VLDL 9  - continue zocor   Dementia  - follows commands - lives in assisted living   I will arrange cardiology follow up. Anticipate discharge today after Dr. Gwenlyn Found sees.     For questions or updates, please contact [place] Please consult [email or web address] for contact info under        Signed, Ledora Bottcher, PA  10/17/2019, 7:28 AM    Agree with note by Fabian Sharp PA-C  Ms. Bartolomei had successful DC cardioversion yesterday by Dr. Oval Linsey.  She is remained in sinus rhythm.  Her beta-blocker was adjusted.  She did receive an amiodarone load and infusion which has been discontinued with p.o. loading.  She is more alert this morning her exam is benign.  She stable for discharge home.   Lorretta Harp, M.D., [place], [facility], [place]ver[place]  [place] [address] [place], [place]  [address]  [phone] 10/17/2019 11:23 AM

## 2019-10-17 NOTE — Progress Notes (Signed)
Occupational Therapy Treatment Patient Details Name: Shirley Bates MRN: [id number] DOB: [birth year] Today's Date: 10/17/2019    History of present illness 86 yo female with onset of a-fib with RVR was admitted, noted pt in CHF w fluid overload.  Suspected of having UTI, LVEF was 40-45% yesterday.  PMHx:  dementia, UTI, e-coli, HTN, ALF resident   OT comments  Pt progressing toward goals. Pt limited by short term memory deficits associated with dementia. Pt on 2L O2 >80% with exertion; requiring 4L O2 for mobility today due to questionable pleth reading. Pt minguardA for mobility with RW and transfers with multimodal cues. Pt minguardA for OOB ADL. Pt's daughter in room and pt responds well to her. Pt would benefit from continued OT skilled services for ADL, mobility and safety. OT following acutely.    Follow Up Recommendations  Supervision/Assistance - 24 hour (return to memory care unit)    Equipment Recommendations  None recommended by OT    Recommendations for Other Services      Precautions / Restrictions Precautions Precautions: Fall Precaution Comments: monitor HR and BP Restrictions Weight Bearing Restrictions: No       Mobility Bed Mobility Overal bed mobility: Needs Assistance Bed Mobility: Sit to Supine;Supine to Sit     Supine to sit: Min assist Sit to supine: Min assist   General bed mobility comments: minA for elevating trunk and scooting to EOB  Transfers Overall transfer level: Needs assistance Equipment used: Rolling walker (2 wheeled) Transfers: Sit to/from [facility] Sit to Stand: Min guard;From elevated surface Stand pivot transfers: Min guard       General transfer comment: Pt minguardA overall with stability required for initial standing balance    Balance Overall balance assessment: Needs assistance Sitting-balance support: Feet supported Sitting balance-Leahy Scale: Good     Standing balance support: Bilateral upper  extremity supported;During functional activity Standing balance-Leahy Scale: Poor Standing balance comment: single UE supported during pericare                           ADL either performed or assessed with clinical judgement   ADL Overall ADL's : Needs assistance/impaired                         Toilet Transfer: Min guard;Ambulation;RW Toilet Transfer Details (indicate cue type and reason): verbal cues for hand placement' Toileting- Clothing Manipulation and Hygiene: Min guard;Sitting/lateral lean;Sit to/from stand;Set up [place] Manipulation Details (indicate cue type and reason): Pt stood for anterior and posterior pericare     Functional mobility during ADLs: Min guard;Rolling walker General ADL Comments: Pt limited by short term memory deficits associated with dementia.     Vision       Perception     Praxis      Cognition Arousal/Alertness: Awake/alert Behavior During Therapy: WFL for tasks assessed/performed Overall Cognitive Status: History of cognitive impairments - at baseline                                 General Comments: multiple cues to complete tasks with mutlimodal cues. Pt with dementia at basline        Exercises     Shoulder Instructions       General Comments pt on 2L O2 >80% with exertion; requiring 4L O2 for mobility today due to questionable pleth reading.  Pertinent Vitals/ Pain       Pain Assessment: No/denies pain  Home Living                                          Prior Functioning/Environment              Frequency  Min 2X/week        Progress Toward Goals  OT Goals(current goals can now be found in the care plan section)  Progress towards OT goals: Progressing toward goals  Acute Rehab OT Goals Patient Stated Goal: get back home and keep moving OT Goal Formulation: With patient Time For Goal Achievement: 10/28/19 Potential to Achieve Goals:  Good ADL Goals Pt Will Perform Grooming: with supervision;standing Pt Will Transfer to Toilet: with supervision;ambulating Pt Will Perform Toileting - Clothing Manipulation and hygiene: with modified independence;sit to/from stand Pt/caregiver will Perform Home Exercise Program: Both right and left upper extremity;Increased strength;With Supervision  Plan Discharge plan remains appropriate    Co-evaluation                 AM-PAC OT "6 Clicks" Daily Activity     Outcome Measure   Help from another person eating meals?: A Little Help from another person taking care of personal grooming?: A Little Help from another person toileting, which includes using toliet, bedpan, or urinal?: A Little Help from another person bathing (including washing, rinsing, drying)?: A Little Help from another person to put on and taking off regular upper body clothing?: A Little Help from another person to put on and taking off regular lower body clothing?: A Little 6 Click Score: 18    End of Session Equipment Utilized During Treatment: Gait belt;Rolling walker;Oxygen  OT Visit Diagnosis: Unsteadiness on feet (R26.81);Muscle weakness (generalized) (M62.81)   Activity Tolerance Patient tolerated treatment well   Patient Left in bed;with call bell/phone within reach;with bed alarm set;with family/visitor present   Nurse Communication Mobility status        Time: 5945-8592 OT Time Calculation (min): 27 min  Charges: OT General Charges $OT Visit: 1 Visit OT Treatments $Self Care/Home Management : 8-22 mins $Therapeutic Activity: 8-22 mins  Jefferey Pica, OTR/L Acute Rehabilitation Services Pager: [phone] Office: [phone]    Shirley Bates 10/17/2019, 3:13 PM

## 2019-10-17 NOTE — Progress Notes (Addendum)
SATURATION QUALIFICATIONS:  Patient Saturations on Room Air at Rest = 85%  Patient Saturations on [employer] while Ambulating = 83%  Patient Saturations on 2 Liters of oxygen while Ambulating = 95%  Please briefly explain why patient needs home oxygen: Pt Oxygen saturation drops below 85% and requires 2L [place] to remain >95%.

## 2019-10-17 NOTE — TOC Transition Note (Signed)
Transition of Care [facility]) - CM/SW Discharge Note   Patient Details  Name: Shirley Bates MRN: [id number] Date of Birth: January 21, 1926  Transition of Care [facility]) CM/SW Contact:  Trula Ore, [place] Phone Number: 10/17/2019, 1:49 PM   Clinical Narrative:     Patient will DC to: [facility]   Anticipated DC date: 10/17/2019  Family notified: Mickel Baas   Transport by: Daughter Mickel Baas   ?  Per MD patient ready for DC to [facility] . RN, patient, patient's family, and facility notified of DC. Discharge Summary sent to facility. RN given number for report [phone] RM#[id number] ask for [facility].    CSW signing off.    Final next level of care: Assisted Living Barriers to Discharge: Continued Medical Work up, No Barriers Identified   Patient Goals and CMS Choice Patient states their goals for this hospitalization and ongoing recovery are:: to go ALF   Choice offered to / list presented to : Adult Children (Daughter Mickel Baas)  Discharge Placement                       Discharge Plan and Services In-house Referral: Clinical Social Work Discharge Planning Services: CM Consult            DME Arranged: Oxygen DME Agency: [employer] Date DME Agency Contacted: 10/17/19 Time DME Agency Contacted: 1330 Representative spoke with at DME Agency: [place]: PT [place]: [facility] [employer]) Date [place]: 10/17/19 Time [place]: 1330 Representative spoke with at [place]: [employer] ([place]) Interventions     Readmission Risk Interventions No flowsheet data found.

## 2019-10-17 NOTE — NC FL2 (Addendum)
[place] MEDICAID FL2 LEVEL OF CARE SCREENING TOOL     IDENTIFICATION  Patient Name: Shirley Bates Birthdate: 01-21-1926 Sex: female Admission Date (Current Location): 10/12/2019  [facility] and [place] Number:  Herbalist and Address:  [facility] [place]. [facility], [place] [address] [place], [place] [address]      Provider Number: [id number]  Attending Physician Name and Address:  Aline August, MD  Relative Name and Phone Number:  Mickel Baas [phone]    Current Level of Care: Hospital Recommended Level of Care: Assisted Living Facility Prior Approval Number:    Date Approved/Denied:   PASRR Number:    Discharge Plan: Other (Comment) (ALF)    Current Diagnoses: Patient Active Problem List   Diagnosis Date Noted   Impaired mobility and activities of daily living 10/14/2019   Atrial fibrillation with RVR ([place]) 10/12/2019   Fluid overload 10/12/2019   Normocytic anemia 10/12/2019   Osteoporosis 05/28/2015   Left ear impacted cerumen 12/12/2012   OA (osteoarthritis) of neck 12/12/2012   Hyperlipidemia    Hypertension     Orientation RESPIRATION BLADDER Height & Weight     Self  O2 (2 liters nasal cannula) Continent Weight: 118 lb 9.6 oz (53.8 kg) Height:  5\' 2"  (157.5 cm)  BEHAVIORAL SYMPTOMS/MOOD NEUROLOGICAL BOWEL NUTRITION STATUS      Continent Diet - low sodium heart healthy    AMBULATORY STATUS COMMUNICATION OF NEEDS Skin   Limited Assist Verbally Normal, Other (Comment) (Appropriate for ethnicity, dry, intact, non-tenting)                       Personal Care Assistance Level of Assistance  Bathing, Feeding, Dressing Bathing Assistance: Limited assistance Feeding assistance: Independent (Good able to feed self;2gram sodium diet) Dressing Assistance: Limited assistance     Functional Limitations Info  Sight, Hearing, Speech Sight Info: Adequate Hearing Info: Adequate Speech Info: Adequate    SPECIAL CARE FACTORS FREQUENCY   PT (By licensed PT), OT (By licensed OT)     PT Frequency: 5x min weekly OT Frequency: 5x min weekly            Contractures Contractures Info: Not present    Additional Factors Info  Code Status, Allergies Code Status Info: DNR Allergies Info: No Known Allergies             TAKE these medications       acetaminophen 500 MG tablet Commonly known as: TYLENOL Take 500 mg by mouth every 6 (six) hours as needed for mild pain.   alendronate 70 MG tablet Commonly known as: FOSAMAX Take 1 tablet (70 mg total) by mouth every 7 (seven) days. Take with a full glass of water on an empty stomach.   aluminum-magnesium hydroxide-simethicone 785-885-02 MG/5ML Susp Commonly known as: MAALOX Take 30 mLs by mouth every 6 (six) hours as needed (indigestion).   amiodarone 200 MG tablet Commonly known as: PACERONE 200 mg twice a day for 7 days then 200 mg daily   apixaban 2.5 MG Tabs tablet Commonly known as: ELIQUIS Take 1 tablet (2.5 mg total) by mouth 2 (two) times daily.   aspirin 81 MG chewable tablet Chew 81 mg by mouth daily.   ferrous sulfate 325 (65 FE) MG tablet Take 1 tablet (325 mg total) by mouth daily with breakfast. Start taking on: October 18, 2019   Fish Oil 1200 MG Caps Take 1,200 mg by mouth daily.   furosemide 20 MG tablet Commonly  known as: LASIX Take 1 tablet (20 mg total) by mouth daily. Start taking on: October 18, 2019   guaiFENesin-dextromethorphan 100-10 MG/5ML syrup Commonly known as: ROBITUSSIN DM Take 5 mLs by mouth every 4 (four) hours as needed for cough.   loperamide 2 MG capsule Commonly known as: IMODIUM Take 2 mg by mouth as needed for diarrhea or loose stools (total 8 doses in 24 HR).   losartan 25 MG tablet Commonly known as: COZAAR Take 1 tablet (25 mg total) by mouth daily. Start taking on: October 18, 2019 What changed:   medication strength  how much to take   magnesium hydroxide 400 MG/5ML suspension Commonly  known as: MILK OF MAGNESIA Take 30 mLs by mouth at bedtime as needed for mild constipation.   metoprolol succinate 25 MG 24 hr tablet Commonly known as: TOPROL-XL Take 3 tablets (75 mg total) by mouth at bedtime.   multivitamin-iron-minerals-folic acid chewable tablet Chew 1 tablet by mouth daily.   neomycin-bacitracin-polymyxin ointment Commonly known as: NEOSPORIN Apply 1 application topically as needed for wound care.   NONFORMULARY OR COMPOUNDED ITEM Apply 1 application topically at bedtime. Antifungal solution: Terbinafine 3%, Fluconazole 2%, Tea Tree Oil 5%, Urea 10%, Ibuprofen 2% in DMSO suspension #66mL  Apply at bedtime to all toenails except right 1st toe   simvastatin 20 MG tablet Commonly known as: ZOCOR TAKE 1 TABLET BY MOUTH AT BEDTIME   tetrahydrozoline 0.05 % ophthalmic solution Place 1 drop into both eyes daily.       Discharge Medications: Please see discharge summary for a list of discharge medications.  Relevant Imaging Results:  Relevant Lab Results:   Additional Information SSN-[id number]  Trula Ore, LCSWA

## 2019-10-17 NOTE — Discharge Summary (Signed)
Physician Discharge Summary  Shirley Bates EOF:[id number] DOB: 07-16-1925 DOA: 10/12/2019  PCP: Susy Frizzle, MD  Admit date: 10/12/2019 Discharge date: 10/17/2019  Admitted From: ALF Disposition: ALF  Recommendations for Outpatient Follow-up:  1. Follow up with PCP in 1 week with repeat CBC/BMP 2. Outpatient follow-up with cardiology 3. Follow up in ED if symptoms worsen or new appear   Home Health: Home with PT Equipment/Devices: Oxygen via nasal cannula 2 L/min  Discharge Condition: Guarded CODE STATUS: DNR Diet recommendation: Heart healthy/fluid restriction of up to 1500 cc a day  Brief/Interim Summary: 86 y.o.femalewith medical history significant ofsevere dementia, hypertension, hyperlipidemia, osteoporosis, recurrent UTI (with colonization) who presented to the ER  with a cough that had been going on for approximately 2 to 3 days as well as change in mentation per her daughter.  In the ED, she was found to have A. fib with RVR requiring Cardizem infusion.  Cardiology was consulted.  CODE STATUS was changed to DNR.  She was found to have E. coli colonization in the urine culture.  She subsequently underwent TEE cardioversion on 10/16/2019.  She was also placed on IV amiodarone drip.  Subsequently, heart rate has remained controlled.  Cardiology has cleared the patient for discharge on oral amiodarone and Eliquis along with oral long-acting metoprolol.  She will be discharged to assisted living facility today.   Discharge Diagnoses:   Paroxysmal A. fib with RVR -She presented with new onset A. fib with RVR requiring Cardizem drip.  Subsequently she was started on Lopressor.  She underwent TEE DCCV on 10/16/2019.  Subsequently she was also started on amiodarone drip.   - Cardiology has cleared the patient for discharge on oral amiodarone 200 mg twice a day for a week and then 200 mg daily and low dose Eliquis twice daily along with oral long-acting metoprolol.  She will be  discharged to assisted living facility today.  Currently rate controlled.  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  Strict input output.  Daily weights.  Fluid restriction of up to 1200 cc a day.  Echo showed EF of 30 to 35% with mild MR and moderate AR and TR.  Outpatient follow-up with cardiology.  Continue losartan 25 mg daily as per cardiology -Troponins did not trend up -Currently still requiring supplemental oxygen at 2 L/min.  We will have to arrange for the same upon discharge.  Bacteriuria -Most likely colonization.  Urine urine culture grew E. coli, most likely colonizer.  Monitoring off antibiotics.  Hypertension -Blood pressure stable.  Continue Toprol-XL.  Losartan has been changed to 25 mg daily by cardiology  Advanced dementia -Outpatient follow-up.  Will need PT at ALF.  Hyponatremia -Monitor sodium level as an outpatient.  Normocytic anemia -With low iron stores.  Started on iron supplementation.  Discharge Instructions  Discharge Instructions    Amb referral to [facility]   Complete by: As directed    Ambulatory referral to Cardiology   Complete by: As directed    Hospital follow-up for A. fib/CHF   Diet - low sodium heart healthy   Complete by: As directed    For home use only DME oxygen   Complete by: As directed    Length of Need: 6 Months   Mode or (Route): Nasal cannula   Liters per Minute: 2   Frequency: Continuous (stationary and portable oxygen unit needed)  Oxygen conserving device: Yes   Oxygen delivery system: Gas   Increase activity slowly   Complete by: As directed      Allergies as of 10/17/2019   No Known Allergies     Medication List    STOP taking these medications   guaifenesin 100 MG/5ML syrup Commonly known as: ROBITUSSIN   LORazepam 0.5 MG tablet Commonly known as: ATIVAN    nitrofurantoin (macrocrystal-monohydrate) 100 MG capsule Commonly known as: Macrobid     TAKE these medications   acetaminophen 500 MG tablet Commonly known as: TYLENOL Take 500 mg by mouth every 6 (six) hours as needed for mild pain.   alendronate 70 MG tablet Commonly known as: FOSAMAX Take 1 tablet (70 mg total) by mouth every 7 (seven) days. Take with a full glass of water on an empty stomach.   aluminum-magnesium hydroxide-simethicone 109-323-55 MG/5ML Susp Commonly known as: MAALOX Take 30 mLs by mouth every 6 (six) hours as needed (indigestion).   amiodarone 200 MG tablet Commonly known as: PACERONE 200 mg twice a day for 7 days then 200 mg daily   apixaban 2.5 MG Tabs tablet Commonly known as: ELIQUIS Take 1 tablet (2.5 mg total) by mouth 2 (two) times daily.   aspirin 81 MG chewable tablet Chew 81 mg by mouth daily.   ferrous sulfate 325 (65 FE) MG tablet Take 1 tablet (325 mg total) by mouth daily with breakfast. Start taking on: October 18, 2019   Fish Oil 1200 MG Caps Take 1,200 mg by mouth daily.   furosemide 20 MG tablet Commonly known as: LASIX Take 1 tablet (20 mg total) by mouth daily. Start taking on: October 18, 2019   guaiFENesin-dextromethorphan 100-10 MG/5ML syrup Commonly known as: ROBITUSSIN DM Take 5 mLs by mouth every 4 (four) hours as needed for cough.   loperamide 2 MG capsule Commonly known as: IMODIUM Take 2 mg by mouth as needed for diarrhea or loose stools (total 8 doses in 24 HR).   losartan 25 MG tablet Commonly known as: COZAAR Take 1 tablet (25 mg total) by mouth daily. Start taking on: October 18, 2019 What changed:   medication strength  how much to take   magnesium hydroxide 400 MG/5ML suspension Commonly known as: MILK OF MAGNESIA Take 30 mLs by mouth at bedtime as needed for mild constipation.   metoprolol succinate 25 MG 24 hr tablet Commonly known as: TOPROL-XL Take 3 tablets (75 mg total) by mouth at bedtime.    multivitamin-iron-minerals-folic acid chewable tablet Chew 1 tablet by mouth daily.   neomycin-bacitracin-polymyxin ointment Commonly known as: NEOSPORIN Apply 1 application topically as needed for wound care.   NONFORMULARY OR COMPOUNDED ITEM Apply 1 application topically at bedtime. Antifungal solution: Terbinafine 3%, Fluconazole 2%, Tea Tree Oil 5%, Urea 10%, Ibuprofen 2% in DMSO suspension #35mL  Apply at bedtime to all toenails except right 1st toe   simvastatin 20 MG tablet Commonly known as: ZOCOR TAKE 1 TABLET BY MOUTH AT BEDTIME   tetrahydrozoline 0.05 % ophthalmic solution Place 1 drop into both eyes daily.            Durable Medical Equipment  (From admission, onward)         Start     Ordered   10/17/19 1351  For home use only DME oxygen  Once       Question Answer Comment  Length of Need 6 Months   Mode or (Route) Nasal cannula   Liters per Minute 2  Frequency Continuous (stationary and portable oxygen unit needed)   Oxygen conserving device Yes   Oxygen delivery system Gas      10/17/19 1350   10/17/19 0000  For home use only DME oxygen       Question Answer Comment  Length of Need 6 Months   Mode or (Route) Nasal cannula   Liters per Minute 2   Frequency Continuous (stationary and portable oxygen unit needed)   Oxygen conserving device Yes   Oxygen delivery system Gas      10/17/19 1350          Follow-up Information    Leonie Man, MD Follow up on 11/08/2019.   Specialty: Cardiology Why: 8AM for hospital follow up Contact information: [place] [address] [phone]        [facility] Follow up.   Why: Oxygen- portable oxygen concentrator to be delivered to the room prior to transition home today. Concentrator to be delivered to [facility].  Contact information: [place] [address] [phone]        [facility] Follow up.   Specialty: Home Health  Services Why: Physical Therapy-office to call within 24-48 hours to establsih care.  Contact information: [address] [place] [address] [phone]        Susy Frizzle, MD. Schedule an appointment as soon as possible for a visit in 1 week(s).   Specialty: Family Medicine Why: with repeat cbc/bmp Contact information: [address] [place] [address] [phone]              No Known Allergies  Consultations:  Cardiology   Procedures/Studies: [facility] Chest Port 1 View  Result Date: 10/12/2019 CLINICAL DATA:  Shortness of breath. Atrial fibrillation with rapid ventricular response. EXAM: PORTABLE CHEST 1 VIEW COMPARISON:  None. FINDINGS: 1321 hours. The heart is enlarged. There is aortic atherosclerosis and mild pulmonary edema. Right-greater-than-left basilar airspace opacities likely represent atelectasis. There are probable small bilateral pleural effusions. No pneumothorax. No acute osseous findings are evident. There are degenerative changes in the spine associated with a convex right thoracic scoliosis. Old fracture of the distal right clavicle noted. Telemetry leads overlie the chest. IMPRESSION: Cardiomegaly with mild pulmonary edema and probable small bilateral pleural effusions consistent with congestive heart failure. Electronically Signed   By: Richardean Sale M.D.   On: 10/12/2019 13:43   ECHOCARDIOGRAM COMPLETE  Result Date: 10/12/2019    ECHOCARDIOGRAM REPORT   Patient Name:   Shirley Bates Date of Exam: 10/12/2019 Medical Rec #:  [id number]     Height:       61.3 in Accession #:    [id number]    Weight:       112.0 lb Date of Birth:  [birth year]     BSA:          1.481 m Patient Age:    70 years      BP:           131/86 mmHg Patient Gender: F             HR:           136 bpm. Exam Location:  Inpatient Procedure: 2D Echo, Cardiac Doppler and Color Doppler Indications:    Atrial fibrillation  History:        Patient has prior history of  Echocardiogram examinations. Risk  Factors:Dyslipidemia and Hypertension.  Sonographer:    Clayton Lefort RDCS (AE) Referring Phys: [id number] Michell Heinrich [facility] IMPRESSIONS  1. Left ventricular ejection fraction, by estimation, is 40 to 45%. The left ventricle has mildly decreased function. The left ventricle demonstrates global hypokinesis. There is moderate concentric left ventricular hypertrophy. Left ventricular diastolic function could not be evaluated.  2. Right ventricular systolic function is normal. The right ventricular size is normal. There is moderately elevated pulmonary artery systolic pressure.  3. Left atrial size was moderately dilated.  4. Right atrial size was moderately dilated.  5. The mitral valve is normal in structure. Mild mitral valve regurgitation. No evidence of mitral stenosis.  6. The aortic valve is grossly normal. Aortic valve regurgitation is mild to moderate. Mild aortic valve sclerosis is present, with no evidence of aortic valve stenosis. Comparison(s): Prior images unable to be directly viewed, comparison made by report only. Conclusion(s)/Recommendation(s): In atrial fibrillation with rapid ventricular response (130-140 bpm) throughout study. FINDINGS  Left Ventricle: Left ventricular ejection fraction, by estimation, is 40 to 45%. The left ventricle has mildly decreased function. The left ventricle demonstrates global hypokinesis. The left ventricular internal cavity size was normal in size. There is  moderate concentric left ventricular hypertrophy. Left ventricular diastolic function could not be evaluated due to atrial fibrillation. Left ventricular diastolic function could not be evaluated. Right Ventricle: The right ventricular size is normal. Right vetricular wall thickness was not assessed. Right ventricular systolic function is normal. There is moderately elevated pulmonary artery systolic pressure. The tricuspid regurgitant velocity is  3.00 m/s, and with an  assumed right atrial pressure of 15 mmHg, the estimated right ventricular systolic pressure is 81.1 mmHg. Left Atrium: Left atrial size was moderately dilated. Right Atrium: Right atrial size was moderately dilated. Pericardium: Trivial pericardial effusion is present. Mitral Valve: The mitral valve is normal in structure. There is moderate thickening of the mitral valve leaflet(s). There is moderate calcification of the mitral valve leaflet(s). Mild to moderate mitral annular calcification. Mild mitral valve regurgitation. No evidence of mitral valve stenosis. Tricuspid Valve: The tricuspid valve is normal in structure. Tricuspid valve regurgitation is mild. Aortic Valve: The aortic valve is grossly normal.. There is mild thickening and mild calcification of the aortic valve. Aortic valve regurgitation is mild to moderate. Aortic regurgitation PHT measures 507 msec. Mild aortic valve sclerosis is present, with no evidence of aortic valve stenosis. Mild to moderate aortic valve annular calcification. There is mild thickening of the aortic valve. There is mild calcification of the aortic valve. Aortic valve mean gradient measures 3.4 mmHg. Aortic valve peak  gradient measures 6.2 mmHg. Aortic valve area, by VTI measures 1.69 cm. Pulmonic Valve: The pulmonic valve was not well visualized. Pulmonic valve regurgitation is not visualized. Aorta: The aortic root and ascending aorta are structurally normal, with no evidence of dilitation. IAS/Shunts: The atrial septum is grossly normal.  LEFT VENTRICLE PLAX 2D LVIDd:         4.00 cm LVIDs:         3.30 cm LV PW:         1.40 cm LV IVS:        1.50 cm LVOT diam:     1.90 cm LV SV:         32 LV SV Index:   22 LVOT Area:     2.84 cm  RIGHT VENTRICLE            IVC RV Basal diam:  3.40  cm    IVC diam: 2.40 cm RV S prime:     8.81 cm/s TAPSE (M-mode): 1.1 cm LEFT ATRIUM           Index       RIGHT ATRIUM           Index LA diam:      4.60 cm 3.11 cm/m  RA Area:     21.30  cm LA Vol (A4C): 73.5 ml 49.63 ml/m RA Volume:   61.20 ml  41.32 ml/m  AORTIC VALVE AV Area (Vmax):    1.65 cm AV Area (Vmean):   1.38 cm AV Area (VTI):     1.69 cm AV Vmax:           124.00 cm/s AV Vmean:          91.000 cm/s AV VTI:            0.191 m AV Peak Grad:      6.2 mmHg AV Mean Grad:      3.4 mmHg LVOT Vmax:         72.06 cm/s LVOT Vmean:        44.380 cm/s LVOT VTI:          0.114 m LVOT/AV VTI ratio: 0.60 AI PHT:            507 msec  AORTA Ao Root diam: 3.60 cm Ao Asc diam:  3.70 cm MR Peak grad:    99.2 mmHg   TRICUSPID VALVE MR Mean grad:    67.0 mmHg   TR Peak grad:   36.0 mmHg MR Vmax:         498.00 cm/s TR Vmax:        300.00 cm/s MR Vmean:        389.0 cm/s MR PISA:         1.01 cm    SHUNTS MR PISA Eff ROA: 8 mm       Systemic VTI:  0.11 m MR PISA Radius:  0.40 cm     Systemic Diam: 1.90 cm Buford Dresser MD Electronically signed by Buford Dresser MD Signature Date/Time: 10/12/2019/10:50:45 PM    Final    ECHO TEE  Result Date: 10/16/2019    TRANSESOPHOGEAL ECHO REPORT   Patient Name:   Shirley Bates Date of Exam: 10/16/2019 Medical Rec #:  [id number]     Height:       61.3 in Accession #:    [id number]    Weight:       126.1 lb Date of Birth:  05/23/1925     BSA:          1.558 m Patient Age:    74 years      BP:           143/93 mmHg Patient Gender: F             HR:           126 bpm. Exam Location:  Inpatient Procedure: Transesophageal Echo, Color Doppler and Cardiac Doppler Indications:     Atrial Fibrillation  History:         Patient has prior history of Echocardiogram examinations, most                  recent 10/12/2019. Risk Factors:Hypertension and Dyslipidemia.  Sonographer:     Mikki Santee RDCS (AE) Referring Phys:  [id number] Tami Lin [facility] Diagnosing Phys: Skeet Latch MD PROCEDURE: The transesophogeal probe was passed  without difficulty through the esophogus of the patient. Sedation performed by different physician. The patient was monitored while  under deep sedation. Anesthestetic sedation was provided intravenously by Anesthesiology: 62.64mg  of Propofol. The patient's vital signs; including heart rate, blood pressure, and oxygen saturation; remained stable throughout the procedure. The patient developed no complications during the procedure. A direct current cardioversion was performed at 120 joules with 1 attempt. IMPRESSIONS  1. Left ventricular ejection fraction, by estimation, is 30 to 35%. The left ventricle has moderately decreased function. The left ventricle demonstrates global hypokinesis.  2. Right ventricular systolic function is moderately reduced. The right ventricular size is normal.  3. No left atrial/left atrial appendage thrombus was detected. The LAA emptying velocity was 62 cm/s.  4. Multiple regurgitant jets. The mitral valve is normal in structure. Mild mitral valve regurgitation. No evidence of mitral stenosis.  5. Tricuspid valve regurgitation is mild to moderate.  6. The aortic valve is tricuspid. Aortic valve regurgitation is mild to moderate. No aortic stenosis is present.  7. There is mild (Grade II) layered plaque involving the transverse aorta and descending aorta.  8. The inferior vena cava is normal in size with greater than 50% respiratory variability, suggesting right atrial pressure of 3 mmHg. Conclusion(s)/Recommendation(s): Normal biventricular function without evidence of hemodynamically significant valvular heart disease. FINDINGS  Left Ventricle: Left ventricular ejection fraction, by estimation, is 30 to 35%. The left ventricle has moderately decreased function. The left ventricle demonstrates global hypokinesis. The left ventricular internal cavity size was normal in size. There is no left ventricular hypertrophy. Right Ventricle: The right ventricular size is normal. No increase in right ventricular wall thickness. Right ventricular systolic function is moderately reduced. Left Atrium: Left atrial size was normal in  size. No left atrial/left atrial appendage thrombus was detected. The LAA emptying velocity was 62 cm/s. Right Atrium: Right atrial size was normal in size. Pericardium: There is no evidence of pericardial effusion. Mitral Valve: Multiple regurgitant jets. The mitral valve is normal in structure. Normal mobility of the mitral valve leaflets. Mild mitral valve regurgitation. No evidence of mitral valve stenosis. Tricuspid Valve: The tricuspid valve is normal in structure. Tricuspid valve regurgitation is mild to moderate. No evidence of tricuspid stenosis. Aortic Valve: The aortic valve is tricuspid. Aortic valve regurgitation is mild to moderate. No aortic stenosis is present. Pulmonic Valve: The pulmonic valve was normal in structure. Pulmonic valve regurgitation is trivial. No evidence of pulmonic stenosis. Aorta: The aortic root is normal in size and structure. There is mild (Grade II) layered plaque involving the transverse aorta and descending aorta. Venous: The inferior vena cava is normal in size with greater than 50% respiratory variability, suggesting right atrial pressure of 3 mmHg. IAS/Shunts: No atrial level shunt detected by color flow Doppler.  LEFT VENTRICLE PLAX 2D LVOT diam:     2.10 cm LVOT Area:     3.46 cm  MR Peak grad:    104.9 mmHg MR Mean grad:    75.0 mmHg   SHUNTS MR Vmax:         512.00 cm/s Systemic Diam: 2.10 cm MR Vmean:        419.0 cm/s MR PISA:         1.57 cm MR PISA Eff ROA: 9 mm MR PISA Radius:  0.50 cm Skeet Latch MD Electronically signed by Skeet Latch MD Signature Date/Time: 10/16/2019/10:57:11 AM    Final        Subjective: Patient seen and examined at  bedside.  Daughter present at bedside.  Patient denies any worsening shortness of breath or chest pain.  Poor historian.  No overnight fever or vomiting reported.  Discharge Exam: Vitals:   10/17/19 0914 10/17/19 1146  BP: (!) 136/66 (!) 126/64  Pulse: 53   Resp:    Temp:  98.2 F (36.8 C)  SpO2: 93%      General: Pt is elderly female lying in bed.  Poor historian.  No distress. Cardiovascular: rate controlled, S1/S2 + Respiratory: bilateral decreased breath sounds at bases with some scattered crackles Abdominal: Soft, NT, ND, bowel sounds + Extremities: Trace lower extremity edema; no cyanosis    The results of significant diagnostics from this hospitalization (including imaging, microbiology, ancillary and laboratory) are listed below for reference.     Microbiology: Recent Results (from the past 240 hour(s))  SARS Coronavirus 2 by RT PCR (hospital order, performed in [facility] hospital lab) Nasopharyngeal Nasopharyngeal Swab     Status: None   Collection Time: 10/12/19  3:50 PM   Specimen: Nasopharyngeal Swab  Result Value Ref Range Status   SARS Coronavirus 2 NEGATIVE NEGATIVE Final    Comment: (NOTE) SARS-CoV-2 target nucleic acids are NOT DETECTED.  The SARS-CoV-2 RNA is generally detectable in upper and lower respiratory specimens during the acute phase of infection. The lowest concentration of SARS-CoV-2 viral copies this assay can detect is 250 copies / mL. A negative result does not preclude SARS-CoV-2 infection and should not be used as the sole basis for treatment or other patient management decisions.  A negative result may occur with improper specimen collection / handling, submission of specimen other than nasopharyngeal swab, presence of viral mutation(s) within the areas targeted by this assay, and inadequate number of viral copies (<250 copies / mL). A negative result must be combined with clinical observations, patient history, and epidemiological information.  Fact Sheet for Patients:   StrictlyIdeas.no  Fact Sheet for Healthcare Providers: BankingDealers.co.za  This test is not yet approved or  cleared by the [place] FDA and has been authorized for detection and/or diagnosis of SARS-CoV-2 by FDA  under an Emergency Use Authorization (EUA).  This EUA will remain in effect (meaning this test can be used) for the duration of the COVID-19 declaration under Section 564(b)(1) of the Act, 21 U.S.C. section 360bbb-3(b)(1), unless the authorization is terminated or revoked sooner.  Performed at [facility], [place] [address], [place], [place] [address]   Urine Culture     Status: Abnormal   Collection Time: 10/13/19  3:33 PM   Specimen: Urine, Clean Catch  Result Value Ref Range Status   Specimen Description URINE, CLEAN CATCH  Final   Special Requests   Final    NONE Performed at [facility], [place] [address] [place], [place] [address]    Culture >=100,000 COLONIES/mL ESCHERICHIA COLI (A)  Final   Report Status 10/15/2019 FINAL  Final   Organism ID, Bacteria ESCHERICHIA COLI (A)  Final      Susceptibility   Escherichia coli - MIC*    AMPICILLIN >=32 RESISTANT Resistant     CEFAZOLIN <=4 SENSITIVE Sensitive     CEFTRIAXONE <=0.25 SENSITIVE Sensitive     CIPROFLOXACIN >=4 RESISTANT Resistant     GENTAMICIN <=1 SENSITIVE Sensitive     IMIPENEM <=0.25 SENSITIVE Sensitive     NITROFURANTOIN <=16 SENSITIVE Sensitive     TRIMETH/SULFA >=320 RESISTANT Resistant     AMPICILLIN/SULBACTAM >=32 RESISTANT Resistant  PIP/TAZO <=4 SENSITIVE Sensitive     * >=100,000 COLONIES/mL ESCHERICHIA COLI  MRSA PCR Screening     Status: Abnormal   Collection Time: 10/15/19  4:30 PM   Specimen: Nasal Mucosa; Nasopharyngeal  Result Value Ref Range Status   MRSA by PCR POSITIVE (A) NEGATIVE Final    Comment:        The GeneXpert MRSA Assay (FDA approved for NASAL specimens only), is one component of a comprehensive MRSA colonization surveillance program. It is not intended to diagnose MRSA infection nor to guide or monitor treatment for MRSA infections. RESULT CALLED TO, READ BACK BY AND VERIFIED WITH: Venetia Maxon RN 1820 10/15/19 A BROWNING Performed at [facility], [place]  [address] [place], [place] [address]   SARS CORONAVIRUS 2 (TAT 6-24 HRS) Nasopharyngeal Nasopharyngeal Swab     Status: None   Collection Time: 10/16/19 11:10 PM   Specimen: Nasopharyngeal Swab  Result Value Ref Range Status   SARS Coronavirus 2 NEGATIVE NEGATIVE Final    Comment: (NOTE) SARS-CoV-2 target nucleic acids are NOT DETECTED.  The SARS-CoV-2 RNA is generally detectable in upper and lower respiratory specimens during the acute phase of infection. Negative results do not preclude SARS-CoV-2 infection, do not rule out co-infections with other pathogens, and should not be used as the sole basis for treatment or other patient management decisions. Negative results must be combined with clinical observations, patient history, and epidemiological information. The expected result is Negative.  Fact Sheet for Patients: SugarRoll.be  Fact Sheet for Healthcare Providers: https://www.woods-mathews.com/  This test is not yet approved or cleared by the [place] FDA and  has been authorized for detection and/or diagnosis of SARS-CoV-2 by FDA under an Emergency Use Authorization (EUA). This EUA will remain  in effect (meaning this test can be used) for the duration of the COVID-19 declaration under Se ction 564(b)(1) of the Act, 21 U.S.C. section 360bbb-3(b)(1), unless the authorization is terminated or revoked sooner.  Performed at [facility], [place] [address], [place], [place] [address]      Labs: BNP (last 3 results) Recent Labs    10/12/19 1550  BNP 735.3*   Basic Metabolic Panel: Recent Labs  Lab 10/12/19 1305 10/12/19 1550 10/13/19 1156 10/14/19 0527 10/15/19 0352 10/16/19 1058 10/17/19 0837  NA   < >  --  135 135 132* 131* 126*  K   < >  --  3.8 3.7 3.8 4.2 4.3  CL   < >  --  98 97* 95* 96* 91*  CO2   < >  --  26 29 29 25 26   GLUCOSE   < >  --  153* 107* 101* 111* 190*  BUN   < >  --  17 17 19 15 20   CREATININE    < >  --  0.97 0.98 0.82 0.69 0.74  CALCIUM   < >  --  8.4* 8.2* 8.2* 8.1* 8.6*  MG  --  2.0  --  1.9 1.9 1.8 1.8   < > = values in this interval not displayed.   Liver Function Tests: Recent Labs  Lab 10/12/19 1305  AST 37  ALT 36  ALKPHOS 75  BILITOT 0.6  PROT 6.4*  ALBUMIN 2.9*   No results for input(s): LIPASE, AMYLASE in the last 168 hours. No results for input(s): AMMONIA in the last 168 hours. CBC: Recent Labs  Lab 10/13/19 1156 10/14/19 0527 10/15/19 0352 10/16/19 1058 10/17/19 2992  WBC 6.6 6.9 6.3 6.7 9.5  NEUTROABS  --  4.9 4.2 5.1 8.0*  HGB 11.0* 10.2* 10.7* 11.7* 11.0*  HCT 33.1* 31.1* 32.1* 35.6* 32.6*  MCV 90.4 89.4 89.9 90.8 89.1  PLT 307 315 329 314 324   Cardiac Enzymes: No results for input(s): CKTOTAL, CKMB, CKMBINDEX, TROPONINI in the last 168 hours. BNP: Invalid input(s): POCBNP CBG: No results for input(s): GLUCAP in the last 168 hours. D-Dimer No results for input(s): DDIMER in the last 72 hours. Hgb A1c No results for input(s): HGBA1C in the last 72 hours. Lipid Profile No results for input(s): CHOL, HDL, LDLCALC, TRIG, CHOLHDL, LDLDIRECT in the last 72 hours. Thyroid function studies No results for input(s): TSH, T4TOTAL, T3FREE, THYROIDAB in the last 72 hours.  Invalid input(s): FREET3 Anemia work up No results for input(s): VITAMINB12, FOLATE, FERRITIN, TIBC, IRON, RETICCTPCT in the last 72 hours. Urinalysis    Component Value Date/Time   COLORURINE AMBER (A) 10/12/2019 1528   APPEARANCEUR CLOUDY (A) 10/12/2019 1528   LABSPEC 1.021 10/12/2019 1528   PHURINE 5.0 10/12/2019 1528   GLUCOSEU NEGATIVE 10/12/2019 1528   HGBUR NEGATIVE 10/12/2019 1528   BILIRUBINUR NEGATIVE 10/12/2019 1528   KETONESUR NEGATIVE 10/12/2019 1528   PROTEINUR 30 (A) 10/12/2019 1528   NITRITE NEGATIVE 10/12/2019 1528   LEUKOCYTESUR LARGE (A) 10/12/2019 1528   Sepsis Labs Invalid input(s): PROCALCITONIN,  WBC,  LACTICIDVEN Microbiology Recent Results  (from the past 240 hour(s))  SARS Coronavirus 2 by RT PCR (hospital order, performed in [place] hospital lab) Nasopharyngeal Nasopharyngeal Swab     Status: None   Collection Time: 10/12/19  3:50 PM   Specimen: Nasopharyngeal Swab  Result Value Ref Range Status   SARS Coronavirus 2 NEGATIVE NEGATIVE Final    Comment: (NOTE) SARS-CoV-2 target nucleic acids are NOT DETECTED.  The SARS-CoV-2 RNA is generally detectable in upper and lower respiratory specimens during the acute phase of infection. The lowest concentration of SARS-CoV-2 viral copies this assay can detect is 250 copies / mL. A negative result does not preclude SARS-CoV-2 infection and should not be used as the sole basis for treatment or other patient management decisions.  A negative result may occur with improper specimen collection / handling, submission of specimen other than nasopharyngeal swab, presence of viral mutation(s) within the areas targeted by this assay, and inadequate number of viral copies (<250 copies / mL). A negative result must be combined with clinical observations, patient history, and epidemiological information.  Fact Sheet for Patients:   StrictlyIdeas.no  Fact Sheet for Healthcare Providers: BankingDealers.co.za  This test is not yet approved or  cleared by the [place] FDA and has been authorized for detection and/or diagnosis of SARS-CoV-2 by FDA under an Emergency Use Authorization (EUA).  This EUA will remain in effect (meaning this test can be used) for the duration of the COVID-19 declaration under Section 564(b)(1) of the Act, 21 U.S.C. section 360bbb-3(b)(1), unless the authorization is terminated or revoked sooner.  Performed at [facility], [place] [address], [place], [place] [address]   Urine Culture     Status: Abnormal   Collection Time: 10/13/19  3:33 PM   Specimen: Urine, Clean Catch  Result Value Ref Range Status    Specimen Description URINE, CLEAN CATCH  Final   Special Requests   Final    NONE Performed at [facility], [place] [address], [place], [place] [address]    Culture >=100,000 COLONIES/mL ESCHERICHIA COLI (A)  Final   Report Status 10/15/2019 FINAL  Final   Organism ID, Bacteria ESCHERICHIA COLI (A)  Final      Susceptibility   Escherichia coli - MIC*    AMPICILLIN >=32 RESISTANT Resistant     CEFAZOLIN <=4 SENSITIVE Sensitive     CEFTRIAXONE <=0.25 SENSITIVE Sensitive     CIPROFLOXACIN >=4 RESISTANT Resistant     GENTAMICIN <=1 SENSITIVE Sensitive     IMIPENEM <=0.25 SENSITIVE Sensitive     NITROFURANTOIN <=16 SENSITIVE Sensitive     TRIMETH/SULFA >=320 RESISTANT Resistant     AMPICILLIN/SULBACTAM >=32 RESISTANT Resistant     PIP/TAZO <=4 SENSITIVE Sensitive     * >=100,000 COLONIES/mL ESCHERICHIA COLI  MRSA PCR Screening     Status: Abnormal   Collection Time: 10/15/19  4:30 PM   Specimen: Nasal Mucosa; Nasopharyngeal  Result Value Ref Range Status   MRSA by PCR POSITIVE (A) NEGATIVE Final    Comment:        The GeneXpert MRSA Assay (FDA approved for NASAL specimens only), is one component of a comprehensive MRSA colonization surveillance program. It is not intended to diagnose MRSA infection nor to guide or monitor treatment for MRSA infections. RESULT CALLED TO, READ BACK BY AND VERIFIED WITH: Venetia Maxon RN 1820 10/15/19 A BROWNING Performed at [facility], [place] [address] [place], [place] [address]   SARS CORONAVIRUS 2 (TAT 6-24 HRS) Nasopharyngeal Nasopharyngeal Swab     Status: None   Collection Time: 10/16/19 11:10 PM   Specimen: Nasopharyngeal Swab  Result Value Ref Range Status   SARS Coronavirus 2 NEGATIVE NEGATIVE Final    Comment: (NOTE) SARS-CoV-2 target nucleic acids are NOT DETECTED.  The SARS-CoV-2 RNA is generally detectable in upper and lower respiratory specimens during the acute phase of infection. Negative results do not preclude  SARS-CoV-2 infection, do not rule out co-infections with other pathogens, and should not be used as the sole basis for treatment or other patient management decisions. Negative results must be combined with clinical observations, patient history, and epidemiological information. The expected result is Negative.  Fact Sheet for Patients: SugarRoll.be  Fact Sheet for Healthcare Providers: https://www.woods-mathews.com/  This test is not yet approved or cleared by the [place] FDA and  has been authorized for detection and/or diagnosis of SARS-CoV-2 by FDA under an Emergency Use Authorization (EUA). This EUA will remain  in effect (meaning this test can be used) for the duration of the COVID-19 declaration under Se ction 564(b)(1) of the Act, 21 U.S.C. section 360bbb-3(b)(1), unless the authorization is terminated or revoked sooner.  Performed at [facility], [place] [address] [place], [place] [address]      Time coordinating discharge: 35 minutes  SIGNED:   Aline August, MD  [facility] 10/17/2019, 1:51 PM

## 2019-10-17 NOTE — TOC Progression Note (Signed)
Transition of Care [facility]) - Progression Note    Patient Details  Name: Shirley Bates MRN: [id number] Date of Birth: 03-Mar-1926  Transition of Care [facility]) CM/SW [place], [place] Phone Number: 10/17/2019, 1:39 PM  Clinical Narrative:     CSW spoke with Shirley Bates at [facility] to let her know that oxygen will be delivered. Patient will be transported by daughter Shirley Bates. Portable Oxygen Concentrator has been ordered and will be delivered to patients room. Orders have been placed with [facility] for physical therapy for patient at [facility].  CSW will continue to follow.  Expected Discharge Plan: Assisted Living Barriers to Discharge: Continued Medical Work up  Expected Discharge Plan and Services Expected Discharge Plan: Assisted Living       Living arrangements for the past 2 months: Assisted Living Facility                                       Social Determinants of Health (SDOH) Interventions    Readmission Risk Interventions No flowsheet data found.

## 2019-10-18 ENCOUNTER — Telehealth: Payer: Self-pay | Admitting: Cardiology

## 2019-10-18 NOTE — Telephone Encounter (Signed)
I would not be the one to write an antianxiety medicine for 86 year old woman.  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  Would recommend that her PCP or the LTC facility provider see the patient to determine appropriate treatment.  Glenetta Hew, MD

## 2019-10-18 NOTE — Telephone Encounter (Signed)
Pt was just discharged from the hospital. She got back to her LTC facility last night ~5:00 pm. She has had a lot of anxiety this morning. The patient's LTC facility contacted the daughter about the issue. The Daughter was hoping Dr. Ellyn Hack would be able to prescribe a new anti anxiety medication

## 2019-10-18 NOTE — Telephone Encounter (Signed)
Will forward to Dr Ellyn Hack for review and recommendations or should this be deferred to PCP./cy

## 2019-10-19 NOTE — Telephone Encounter (Signed)
Please contact daughter.  Anxiety medicine can cause confusion and falls.  I would use sparingly if at all.  Does she feel this is necessary.  If so, we could try low does xanax 0.25 mg poq12 hrs prn.  Let me know what she decides.

## 2019-10-19 NOTE — Telephone Encounter (Signed)
Follow up   Pts daughter is calling back, she says she has not heard anything back and yesterday the pt was experiencing a lot of anxiety   Please call

## 2019-10-19 NOTE — Telephone Encounter (Signed)
Spoke with daughter and advised that she contact PCP about anti-anxiety medication. Dr. Dennard Schaumann has already addressed. She will call his office

## 2019-10-29 ENCOUNTER — Telehealth: Payer: Self-pay | Admitting: Cardiology

## 2019-10-29 NOTE — Telephone Encounter (Signed)
Pt c/o swelling: STAT is pt has developed SOB within 24 hours  1) How much weight have you gained and in what time span? Daughter is unsure   2) If swelling, where is the swelling located? Both of her ankles, going up her legs   3) Are you currently taking a fluid pill? No   4) Are you currently SOB? No   5) Do you have a log of your daily weights (if so, list)? No   6) Have you gained 3 pounds in a day or 5 pounds in a week? unsure  7) Have you traveled recently? No  Daughter wants to know if they can start her on a fluid pill.   She also want to know of she can take rispergone a low dosage for her anxiety.

## 2019-10-29 NOTE — Telephone Encounter (Signed)
Spoke to [place] with [employer].Patient has increased swelling in both lower legs.Right is worse.Swelling has been worse since discharged from [facility] hospital.Patient is suppose to use O2 but she is non compliant and will not wear O2.  Stated they have not weighed patient.Advised ok to double Lasix dose for the next 3 days only then return to normal dose.She does not have a order for compression hose,but she doubts patient will wear.Stated she has anxiety.She screams at staff.House Dr.wanted to know if ok to start Risperdone.Advised I will send message to Andrew for advice.

## 2019-10-30 NOTE — Telephone Encounter (Signed)
To be honest, I saw this lady for consult and I have not seen her since.  She was seen by several doctors in the hospital. I saw her in the ER for A. fib. She was started on amiodarone.  I would have to check with our pharmacist to see if Risperdal would be a contraindication.  They should have in order to do weight daily weights since she is gaining fluid.  Okay to do 3 days of double up Lasix as suggested.  Any more than that, I would say that she probably needs to have a visit soon.  Glenetta Hew, MD

## 2019-10-31 NOTE — Telephone Encounter (Signed)
Message sent to pharmacy for advice.

## 2019-11-01 NOTE — Telephone Encounter (Signed)
Called [facility] RN-aware of recommendations and they are weighing her daily, have not weighted yet today.  Will fax order to continue daily weights.  Also requested to call tomorrow for update on weight and symptoms.    Order faxed to [phone]

## 2019-11-01 NOTE — Telephone Encounter (Signed)
Risperdal interacts with amiodarone, causes QT prolongation.  Is there something else they can use?

## 2019-11-01 NOTE — Telephone Encounter (Signed)
Ativan would be fine with her meds

## 2019-11-01 NOTE — Telephone Encounter (Signed)
Called and spoke to daughter-aware of recommendations.   # provided to call Merchandiser, retail at [employer].   Daughter wondering if patient can take Ativan.  Routed to pharmD to review.

## 2019-11-02 IMAGING — DX DG HIP (WITH OR WITHOUT PELVIS) 3-4V BILAT
5 series · 5 of 5 positions shown · non-contrast
Comparison: None.

CLINICAL DATA: Persistent leg pain after falling last week.

EXAM:
DG HIP (WITH OR WITHOUT PELVIS) 3-4V BILAT

[pelvis ap]
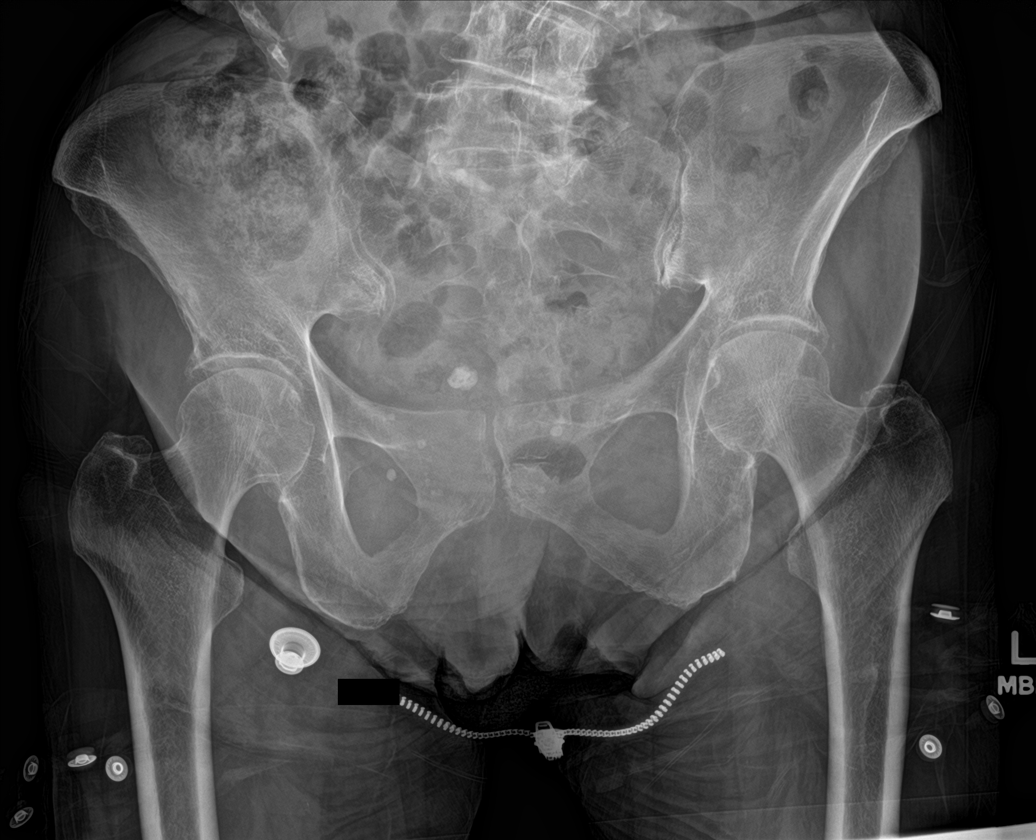

[hip ap (1 of 2)]
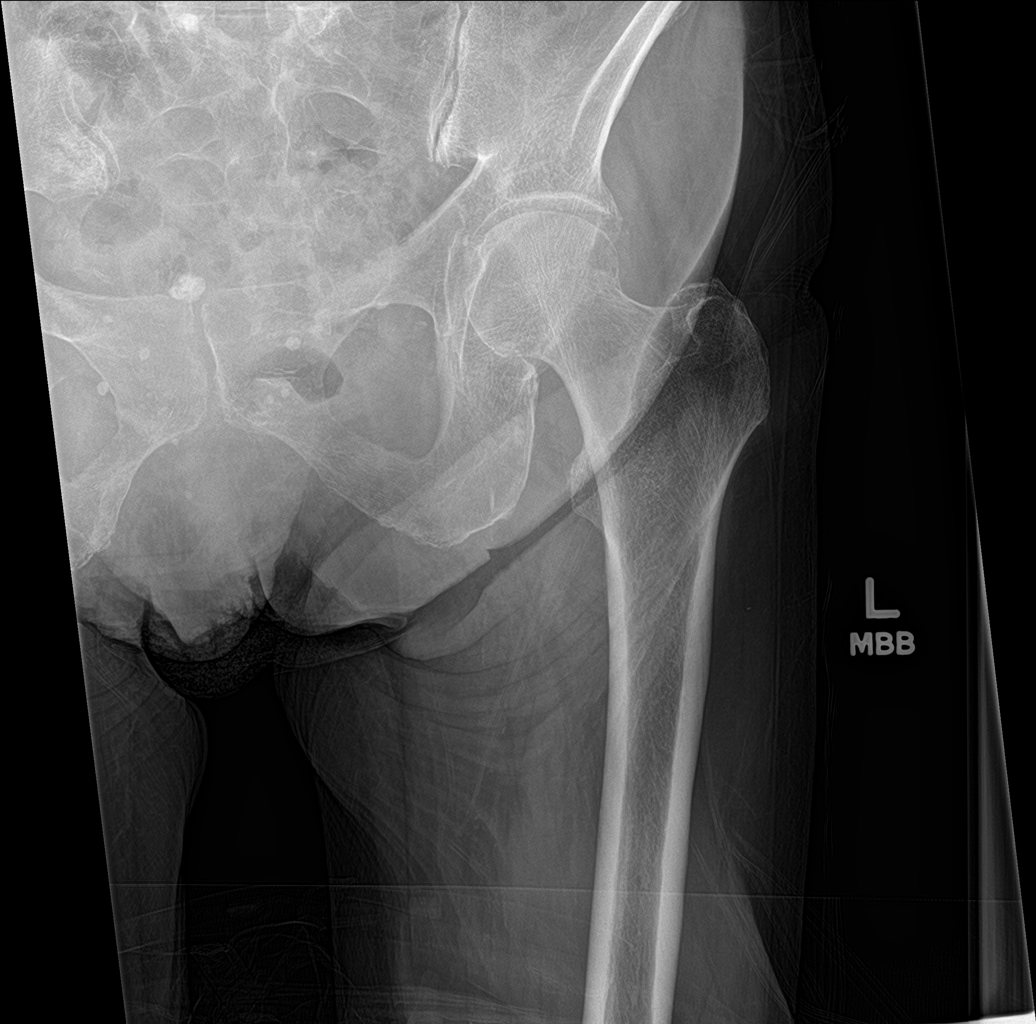

[hip lat (1 of 2)]
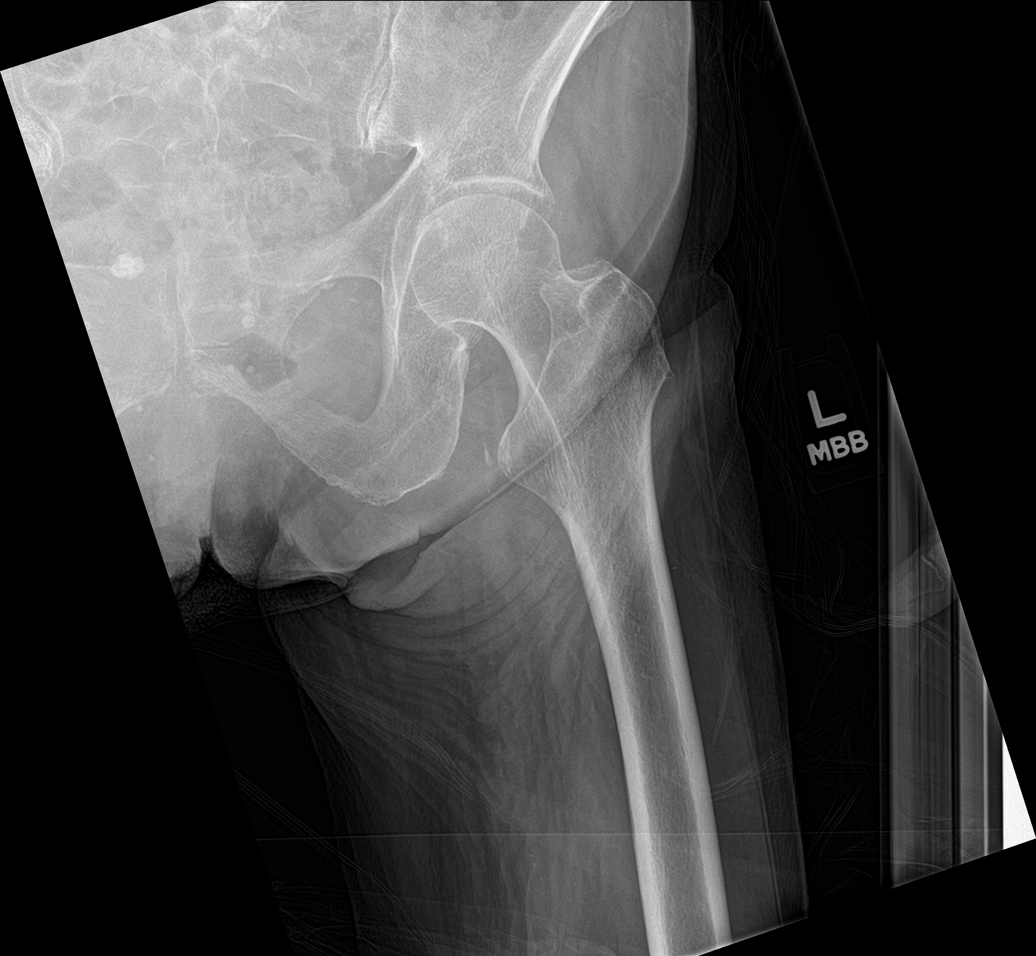

[hip ap (2 of 2)]
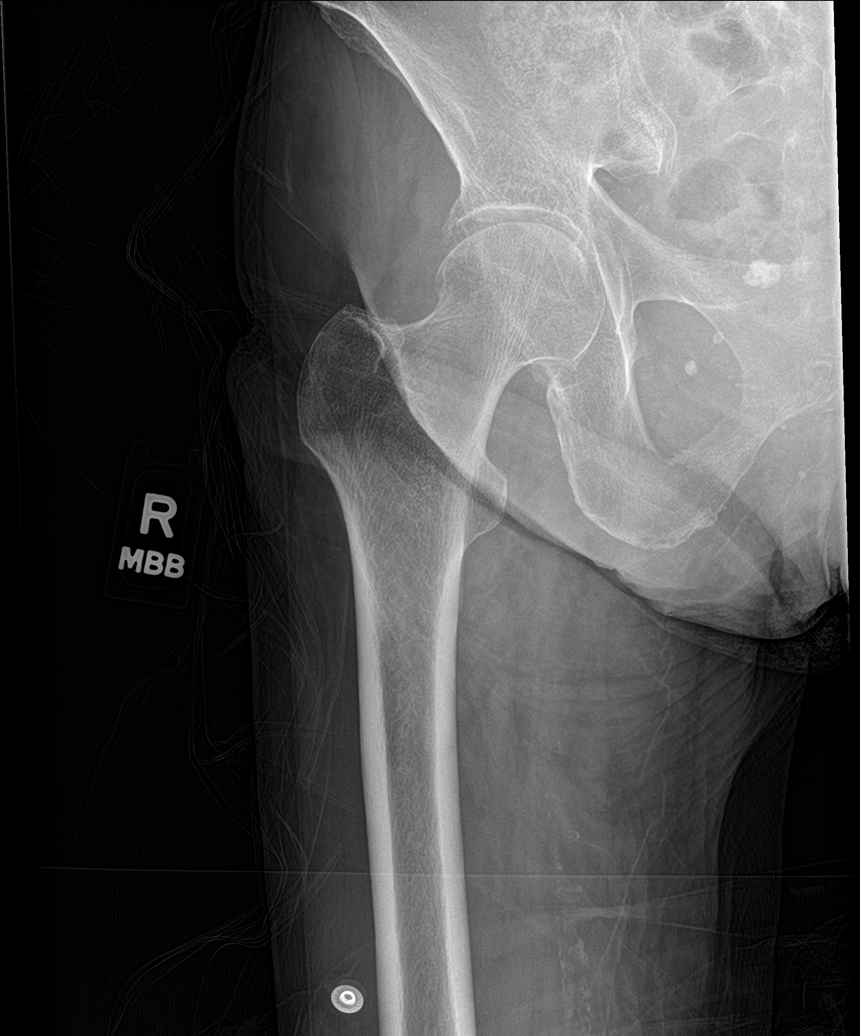

[hip lat (2 of 2)]
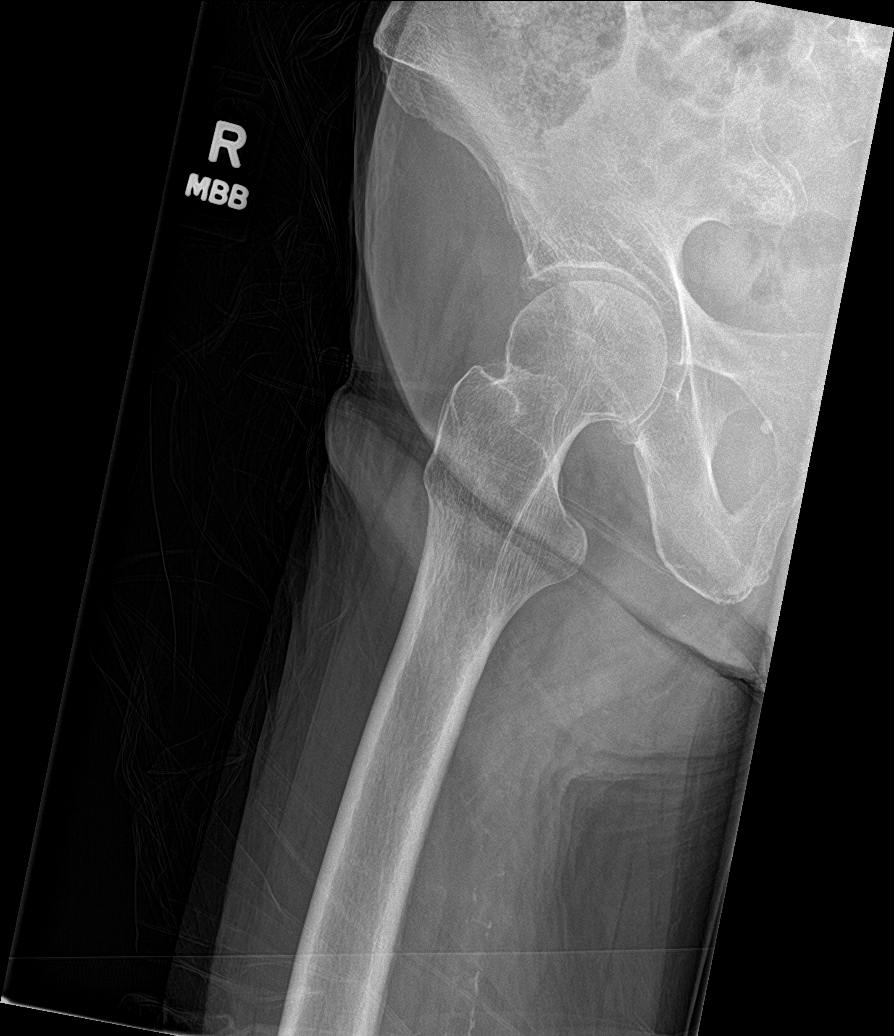

[5 of 5 positions shown; findings below may reference images not displayed]

FINDINGS: No evidence of pelvic or hip fracture.
IMPRESSION: Negative.

## 2019-11-08 ENCOUNTER — Ambulatory Visit: Payer: Medicare Other | Admitting: Cardiology

## 2019-11-08 ENCOUNTER — Encounter: Payer: Self-pay | Admitting: Cardiology

## 2019-11-08 ENCOUNTER — Encounter: Payer: Self-pay | Admitting: Cardiovascular Disease

## 2019-11-08 ENCOUNTER — Telehealth: Payer: Self-pay | Admitting: Cardiology

## 2019-11-08 ENCOUNTER — Other Ambulatory Visit: Payer: Self-pay

## 2019-11-08 VITALS — BP 122/76 | HR 89 | Ht 62.0 in | Wt 124.0 lb

## 2019-11-08 DIAGNOSIS — I10 Essential (primary) hypertension: Secondary | ICD-10-CM | POA: Diagnosis not present

## 2019-11-08 DIAGNOSIS — I420 Dilated cardiomyopathy: Secondary | ICD-10-CM

## 2019-11-08 DIAGNOSIS — E782 Mixed hyperlipidemia: Secondary | ICD-10-CM

## 2019-11-08 DIAGNOSIS — I4891 Unspecified atrial fibrillation: Secondary | ICD-10-CM | POA: Insufficient documentation

## 2019-11-08 DIAGNOSIS — E8770 Fluid overload, unspecified: Secondary | ICD-10-CM

## 2019-11-08 LAB — CBC
Hematocrit: 34.4 % (ref 34.0–46.6)
Hemoglobin: 11 g/dL — ABNORMAL LOW (ref 11.1–15.9)
MCH: 29.3 pg (ref 26.6–33.0)
MCHC: 32 g/dL (ref 31.5–35.7)
MCV: 92 fL (ref 79–97)
Platelets: 337 10*3/uL (ref 150–450)
RBC: 3.75 x10E6/uL — ABNORMAL LOW (ref 3.77–5.28)
RDW: 14.4 % (ref 11.7–15.4)
WBC: 7.4 10*3/uL (ref 3.4–10.8)

## 2019-11-08 LAB — BASIC METABOLIC PANEL
BUN/Creatinine Ratio: 15 (ref 12–28)
BUN: 12 mg/dL (ref 10–36)
CO2: 28 mmol/L (ref 20–29)
Calcium: 9 mg/dL (ref 8.7–10.3)
Chloride: 98 mmol/L (ref 96–106)
Creatinine, Ser: 0.82 mg/dL (ref 0.57–1.00)
GFR calc Af Amer: 72 mL/min/{1.73_m2} (ref >59)
GFR calc non Af Amer: 62 mL/min/{1.73_m2} (ref >59)
Glucose: 80 mg/dL (ref 65–99)
Potassium: 4.9 mmol/L (ref 3.5–5.2)
Sodium: 137 mmol/L (ref 134–144)

## 2019-11-08 MED ORDER — METOPROLOL SUCCINATE ER 100 MG PO TB24
100.0000 mg | ORAL_TABLET | Freq: Every day | ORAL | 3 refills | Status: DC
Start: 2019-11-08 — End: 2019-11-16

## 2019-11-08 MED ORDER — FUROSEMIDE 20 MG PO TABS
ORAL_TABLET | ORAL | 3 refills | Status: DC
Start: 2019-11-08 — End: 2020-01-01

## 2019-11-08 MED ORDER — FUROSEMIDE 20 MG PO TABS: ORAL_TABLET | ORAL | 3 refills | Status: DC

## 2019-11-08 NOTE — Telephone Encounter (Signed)
Error

## 2019-11-08 NOTE — Telephone Encounter (Signed)
Patients daughter called in regards to patients covid test for her procedure. She wanted to let sharon know that [facility] does do the swab covid testing and wanted to know if this would be sufficient? She stated that [place] wanted to know if there was a form that needed to be filled out or what they would need to put the test results on to fax. Advise patients daughter I would forward this message to [place].

## 2019-11-08 NOTE — Progress Notes (Signed)
Primary Care Provider: Jeanette Caprice, PA-C Cardiologist: Glenetta Hew, MD Electrophysiologist: None  Clinic Note: Chief Complaint  Patient presents with  . Hospitalization Follow-up    Inpatient consult; seen in the ER  . Atrial Fibrillation    New diagnosis   HPI:    Shirley Bates is a 86 y.o. female with a PMH notable for dementia, HTN, HLD with recent hospitalization for cough & dyspnea - noted to be in Afib RVR (with HFpEF Sx) who presents today for hospital f/u.  Recent Hospitalizations: Admitted 10/12/2019, noted to be in A. fib RVR.  UA showed bacteriuria most likely colonization.  Shirley Bates was seen in consultation in the emergency room.  She was brought in by EMS for dyspnea (gasping for air).  She did noted to 3 days of feeling congested with dry cough.  By the time EMS got there she was gasping for air was noted to be tachycardic and in A. fib RVR.  Rate as high as 170s by EMS.  She noted mild orthopnea but no PND and minimal edema.  Not aware of palpitations. ->  Was started on diltiazem drip only added low-dose Lopressor.  Started on Eliquis 2.5 mg twice daily.  Was given IV Lasix in the ER and home losartan continued.  She was converted to combination of oral diltiazem plus metoprolol.  Converted to oral Lasix-reduce to 20 mg daily CHA2DS2-VASc score was 5 (7.2 % stroke rate/year; CHF, female, age x2, HTN)) -> Eliquis 2.5 mg twice daily  Underwent TEE cardioversion requiring amiodarone.  Therefore diltiazem discontinued.  TEE EF is mildly lower.  Plan 30-month follow-up.  Converted to oral amiodarone load 200 mg twice daily for 7 days then 200 mg daily.  Home dose losartan reduced to 25 mg.  Discharge Toprol dose 75 mg  She was discharged on home oxygen.  Reviewed  CV studies:    The following studies were reviewed today: (if available, images/films reviewed: From Epic Chart or Care Everywhere) . 2D Echo (10/12/2019):: EF 40 and 45%.  Moderate biatrial  enlargement.  Mild to moderate AI. Marland Kitchen TEE-DCCV (10/16/2019): EF estimated 30 to 35% moderate decreased function.  Moderately reduced RV function.  No LAA thrombus.  Mild MR with multiple jets.  Mild to moderate TR.  Grade 2 aortic atheroma.  Post procedure patient had little bursts of A. fib.  Was given 150 amiodarone bolus  Interval History:   Shirley Bates returns here today for follow-up.  She has pretty significant edema worse on the right than the left.  She still has some exertional dyspnea, but is not all that active.  She does use a rolling walker, and notes easy fatigue.  She felt okay when she first went home.  She really has not noticed any irregular heartbeats or palpitations.  Maybe a little bit more short of breath after the first 2 days of going home, but has not really noted any chest pain or pressure with rest or exertion.  The biggest issue has been the swelling and may be a little of orthopnea.  She has been a little bit more fatigued, but not necessarily noting exertional dyspnea.  CV Review of Symptoms (Summary) Cardiovascular ROS: positive for - dyspnea on exertion, edema, orthopnea, shortness of breath and Exertional fatigue, significant edema negative for - chest pain, irregular heartbeat, palpitations, paroxysmal nocturnal dyspnea, rapid heart rate, shortness of breath or Syncope or near syncope, TIA/amaurosis fugax, claudication  The patient does not have symptoms  concerning for COVID-19 infection (fever, chills, cough, or new shortness of breath).  The patient is practicing social distancing & Masking.    REVIEWED OF SYSTEMS   Review of Systems  Constitutional: Positive for malaise/fatigue. Negative for weight loss (Weight is actually up).  HENT: Negative for congestion and nosebleeds.   Respiratory: Positive for cough. Negative for shortness of breath.   Cardiovascular: Positive for leg swelling.  Gastrointestinal: Negative for abdominal pain, blood in stool and  melena.  Genitourinary: Negative for hematuria.  Musculoskeletal: Positive for joint pain. Negative for falls.  Neurological: Positive for weakness (Legs awake). Negative for dizziness (Poor balance.  Uses walker) and focal weakness.  Psychiatric/Behavioral: Positive for memory loss. Negative for depression. The patient has insomnia (Has daytime sleepiness). The patient is not nervous/anxious.     I have reviewed and (if needed) personally updated the patient's problem list, medications, allergies, past medical and surgical history, social and family history.   PAST MEDICAL HISTORY   Past Medical History:  Diagnosis Date  . Atrial fibrillation status post cardioversion ([place]) 10/12/2019   With recurrent A. fib post cardioversion (was placed on amiodarone and had TEE cardioversion)  . Cancer ([place])   . Hyperlipidemia   . Hypertension     PAST SURGICAL HISTORY   Past Surgical History:  Procedure Laterality Date  . 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 CARDIOVERSION N/A 10/16/2019   Procedure: CARDIOVERSION;  Surgeon: Skeet Latch, MD;  Location: [facility];  Service: Cardiovascular;;;  Post procedure patient had little bursts of A. fib.  Was given 150 amiodarone bolus  . TEE WITHOUT CARDIOVERSION N/A 10/16/2019   Procedure: TRANSESOPHAGEAL ECHOCARDIOGRAM (TEE);  Surgeon: Skeet Latch, MD;  Location: [facility];  Service: Cardiovascular;;  EF estimated 30 to 35% moderate decreased function.  Moderately reduced RV function.  No LAA thrombus.  Mild MR with multiple jets.  Mild to moderate TR.  Grade 2 aortic atheroma.   . TRANSTHORACIC ECHOCARDIOGRAM  10/12/2019   EF 40 and 45%.  Moderate biatrial enlargement.  Mild to moderate AI.    MEDICATIONS/ALLERGIES   Current Meds  Medication Sig  . acetaminophen (TYLENOL) 500 MG tablet Take 500 mg by mouth every 6 (six) hours as needed for mild pain.  Marland Kitchen alendronate (FOSAMAX) 70 MG tablet Take 1 tablet (70 mg total) by mouth  every 7 (seven) days. Take with a full glass of water on an empty stomach. (Patient not taking: Reported on 11/13/2019)  . aluminum-magnesium hydroxide-simethicone (MAALOX) 956-213-08 MG/5ML SUSP Take 30 mLs by mouth every 6 (six) hours as needed (indigestion).  Marland Kitchen guaiFENesin-dextromethorphan (ROBITUSSIN DM) 100-10 MG/5ML syrup Take 5 mLs by mouth every 4 (four) hours as needed for cough.  . loperamide (IMODIUM) 2 MG capsule Take 2 mg by mouth as needed for diarrhea or loose stools (total 8 doses in 24 HR).  . magnesium hydroxide (MILK OF MAGNESIA) 400 MG/5ML suspension Take 30 mLs by mouth at bedtime as needed for mild constipation.  . multivitamin-iron-minerals-folic acid (CENTRUM) chewable tablet Chew 1 tablet by mouth daily.  Marland Kitchen neomycin-bacitracin-polymyxin (NEOSPORIN) ointment Apply 1 application topically as needed for wound care.  . NONFORMULARY OR COMPOUNDED ITEM Apply 1 application topically at bedtime. Antifungal solution: Terbinafine 3%, Fluconazole 2%, Tea Tree Oil 5%, Urea 10%, Ibuprofen 2% in DMSO suspension #71mL  Apply at bedtime to all toenails except right 1st toe  . Omega-3 Fatty Acids (FISH OIL) 1200 MG CAPS Take 1,200 mg by mouth daily.  (Patient  not taking: Reported on 11/13/2019)  . simvastatin (ZOCOR) 20 MG tablet TAKE 1 TABLET BY MOUTH AT BEDTIME (Patient taking differently: Take 20 mg by mouth at bedtime. )  . tetrahydrozoline 0.05 % ophthalmic solution Place 1 drop into both eyes in the morning.   . [DISCONTINUED] amiodarone (PACERONE) 200 MG tablet 200 mg twice a day for 7 days then 200 mg daily (Patient taking differently: Take 200 mg by mouth daily. )  . [DISCONTINUED] apixaban (ELIQUIS) 2.5 MG TABS tablet Take 1 tablet (2.5 mg total) by mouth 2 (two) times daily. (Patient taking differently: Take 2.5 mg by mouth 2 (two) times daily. (0600) & (1900))  . [DISCONTINUED] ferrous sulfate 325 (65 FE) MG tablet Take 1 tablet (325 mg total) by mouth daily with breakfast. (Patient  taking differently: Take 325 mg by mouth daily with breakfast. (0800))  . [DISCONTINUED] furosemide (LASIX) 20 MG tablet Take 1 tablet (20 mg total) by mouth daily.  . [DISCONTINUED] furosemide (LASIX) 20 MG tablet Take 20 mg everyday except on Mon, Wed,and Fri. Take 40 mg ( 2 tablets).  If weight is greater than 3 lbs or increase swelling  Take 40 40 mg for 3 days then return daily dosing.  . [DISCONTINUED] losartan (COZAAR) 25 MG tablet Take 1 tablet (25 mg total) by mouth daily. (Patient taking differently: Take 25 mg by mouth daily. (0800))  . [DISCONTINUED] metoprolol succinate (TOPROL-XL) 25 MG 24 hr tablet Take 3 tablets (75 mg total) by mouth at bedtime.    No Known Allergies  SOCIAL HISTORY/FAMILY HISTORY   Social History   Tobacco Use  . Smoking status: Never Smoker  . Smokeless tobacco: Never Used  Substance Use Topics  . Alcohol use: No  . Drug use: No   Social History   Social History Narrative   She has been very independent was living on her own until January and February 2021.  She moved into the [facility].   CODE STATUS is DNR.    family history includes Heart disease in her brother, father, and sister; Hypertension in her brother.  No known history of A. fib   OBJCTIVE -PE, EKG, labs   Wt Readings from Last 3 Encounters:  11/08/19 124 lb (56.2 kg)  10/17/19 118 lb 9.6 oz (53.8 kg)  03/19/19 112 lb (50.8 kg)    Physical Exam: BP 122/76   Pulse 89   Ht 5\' 2"  (1.575 m)   Wt 124 lb (56.2 kg)   SpO2 100%   BMI 22.68 kg/m  Physical Exam Vitals reviewed.  Constitutional:      General: She is not in acute distress.    Appearance: Normal appearance. She is normal weight. She is ill-appearing (She seems a little bit worn out or tired appearing.). She is not toxic-appearing.  HENT:     Head: Normocephalic and atraumatic.  Neck:     Vascular: Hepatojugular reflux and JVD (7-8 cmH2O, cannon A waves) present. No carotid bruit.  Cardiovascular:     Rate  and Rhythm: Normal rate. Rhythm irregularly irregular.  No extrasystoles are present.    Chest Wall: PMI is not displaced.     Pulses: Decreased pulses (Mildly decreased pedal pulses).     Heart sounds: Murmur (1-2/6 SEM at RUSB, 1/6 at apex) heard.   Pulmonary:     Effort: Pulmonary effort is normal. No respiratory distress.     Breath sounds: Normal breath sounds.  Chest:     Chest wall: No tenderness.  Musculoskeletal:        General: No deformity. Normal range of motion.     Right lower leg: Edema (3+ up to ankle) present.     Left lower leg: Edema (2+-to left knee) present.  Neurological:     General: No focal deficit present.     Mental Status: She is alert.     Comments: Oriented to date, location but not necessarily time  Psychiatric:     Comments: She falls asleep in the middle of our talk.  Daughter answers most of the questions.  Does not really answer questions.     Adult ECG Report  Rate: 89;  Rhythm: atrial fibrillation, premature ventricular contractions (PVC) and Controlled rate; normal axis, intervals durations.  Narrative Interpretation: A. fib is replaced sinus rhythm post cardioversion  Recent Labs:    Lab Results  Component Value Date   CHOL 118 10/13/2019   HDL 43 10/13/2019   LDLCALC 66 10/13/2019   TRIG 46 10/13/2019   CHOLHDL 2.7 10/13/2019   Lab Results  Component Value Date   CREATININE 0.82 11/08/2019   BUN 12 11/08/2019   NA 137 11/08/2019   K 4.9 11/08/2019   CL 98 11/08/2019   CO2 28 11/08/2019   Lab Results  Component Value Date   TSH 1.279 10/12/2019    ASSESSMENT/PLAN    Problem List Items Addressed This Visit    Atrial fibrillation, new onset ([place]) - Primary (Chronic)    Newly identified back in July when she came in with heart failure symptoms and A. fib RVR.  She underwent TEE/DCCV and was placed on amiodarone because of frequent ectopy postop.  She is now back in A. fib, and it may just be that she has not had enough  amiodarone loading.  Plan:   Continue amiodarone for rhythm control.  And increase from 75 to 100 mg Toprol for rate control.  Continue Eliquis.  Plan will be to schedule for cardioversion next week which would put her just at 4 weeks post initiation of Eliquis..    Following cardioversion, recheck 2D echo to better assess EF.  For now continue amiodarone for rhythm control  If she has recurrence of A. fib even on amiodarone, I would then simply plan rate control.      Relevant Medications   metoprolol succinate (TOPROL-XL) 100 MG 24 hr tablet   furosemide (LASIX) 20 MG tablet   Other Relevant Orders   EKG 12-Lead (Completed)   ECHOCARDIOGRAM COMPLETE   Basic metabolic panel (Completed)   CBC (Completed)   ELECTRICAL CARDIOVERSION   Dilated cardiomyopathy (HCC) (Chronic)    I suspect reduced EF is probably related to A. fib.  Needs to be rechecked once we have restored sinus rhythm.  Plan: For now continue metoprolol plus losartan.  Would probably not try to titrate to [facility] unless we clearly have evidence of reduced EF on long-term. She is currently on furosemide 20 mg a day.  We will increase to 40 mg a day for 3 days out of the week in light of her worsening edema. Would like to try to avoid polypharmacy on a 86 year old.  Will probably hold off on converting to [facility] or adding spironolactone.      Relevant Medications   metoprolol succinate (TOPROL-XL) 100 MG 24 hr tablet   furosemide (LASIX) 20 MG tablet   Essential hypertension (Chronic)    Blood pressure looks very good.  She was switched off of diltiazem in the hospital because  of reduced EF.  Is on 75 mg Toprol +25 mg losartan.      Relevant Medications   metoprolol succinate (TOPROL-XL) 100 MG 24 hr tablet   furosemide (LASIX) 20 MG tablet   Hyperlipidemia (Chronic)    She is on simvastatin, moderate dose.  Unless there is evidence of CAD, I think we can probably stop that going down the road.       Relevant Medications   metoprolol succinate (TOPROL-XL) 100 MG 24 hr tablet   furosemide (LASIX) 20 MG tablet   Fluid overload    Still has volume overload.  Probably related to the dilated cardiomyopathy.  Not really having heart failure symptoms of PND and orthopnea.  Plan: We will increase furosemide dosing to 40 mg 3 days a week.  Also discussed sliding scale.          COVID-19 Education: The signs and symptoms of COVID-19 were discussed with the patient and how to seek care for testing (follow up with PCP or arrange E-visit).   The importance of social distancing and COVID-19 vaccination was discussed today.  I spent a total of 67minutes with the patient in direct patient consultation.  Additional time spent with chart review  / charting (studies, outside notes, etc): 18 Total Time: 42 min   Current medicines are reviewed at length with the patient today.  (+/- concerns) not sure what to do with diuretic  Notice: This dictation was prepared with Dragon dictation along with smaller phrase technology. Any transcriptional errors that result from this process are unintentional and may not be corrected upon review.  Patient Instructions / Medication Changes & Studies & Tests Ordered   Patient Instructions    Medication Instructions:   Stop Metoprolol succinate 75 mg Start Metoprolol Succinate 100 mg at bedtime  Change in Lasix dosing -Take 20 mg everyday except on Mon, Wed,and Fri. Take 40 mg ( 2 tablets). If weight is greater than 3 lbs or increase swelling Take  40 mg for 3 days then return daily dosing.   *If you need a refill on your cardiac medications before your next appointment, please call your pharmacy*   Lab Work: BMP CBC today  If you have labs (blood work) drawn today and your tests are completely normal, you will receive your results only by: Marland Kitchen MyChart Message (if you have MyChart) OR . A paper copy in the mail If you have any lab test that is abnormal  or we need to change your treatment, we will call you to review the results.   Testing/Procedures:  will be schedule at [facility] for cardioversion Your physician has recommended that you have a Cardioversion (DCCV). Electrical Cardioversion uses a jolt of electricity to your heart either through paddles or wired patches attached to your chest. This is a controlled, usually prescheduled, procedure. Defibrillation is done under light anesthesia in the hospital, and you usually go home the day of the procedure. This is done to get your heart back into a normal rhythm. You are not awake for the procedure. Please see the instruction sheet given to you today.  And  Will be schedule in 3 months at [facility] has requested that you have an echocardiogram. Echocardiography is a painless test that uses sound waves to create images of your heart. It provides your doctor with information about the size and shape of your heart and how well your heart's chambers and valves are  working. This procedure takes approximately one hour. There are no restrictions for this procedure.  Follow-Up: At [facility], you and your health needs are our priority.  As part of our continuing mission to provide you with exceptional heart care, we have created designated Provider Care Teams.  These Care Teams include your primary Cardiologist (physician) and Advanced Practice Providers (APPs -  Physician Assistants and Nurse Practitioners) who all work together to provide you with the care you need, when you need it.    Your next appointment:   2 week(s)  The format for your next appointment:   In Person  Provider:   Zandra Abts, or Mingo Amber  NP   Other Instructions Your physician recommends that you schedule a follow-up appointment in 3 months with Dr Ellyn Hack  After ECHO   Dear  Ms Encarnacion Slates are scheduled for a TEE/Cardioversion/TEE  Cardioversion on Aug 26 ,2021 with Dr. Cathie Olden.  Please arrive at the [facility] (Main Entrance A) at [facility]: [address] [place], [place] [address] at 9:30 am.  DIET: Nothing to eat or drink after midnight except a sip of water with medications (see medication instructions below)  Medication Instructions:  Continue your anticoagulant: ELiquis 2.5 mg  You will need to continue your anticoagulant after your procedure until you are told by your  Provider that it is safe to stop   Labs:cbc , bmp  Today  At [facility] in the office   Covid testing  is need on Monday  AUG 23,2021 at [facility] - please fax results to [phone]  You must have a responsible person to drive you home and stay in the waiting area during your procedure. Failure to do so could result in cancellation.  Bring your insurance cards.  *Special Note: Every effort is made to have your procedure done on time. Occasionally there are emergencies that occur at the hospital that may cause delays. Please be patient if a delay does occur.      Studies Ordered:   Orders Placed This Encounter  Procedures  . ELECTRICAL CARDIOVERSION  . Basic metabolic panel  . CBC  . EKG 12-Lead  . ECHOCARDIOGRAM COMPLETE     Glenetta Hew, M.D., M.S. Interventional Cardiologist   Pager # [phone] Phone # [phone] [address] [place],  [address]   Thank you for choosing Heartcare at [facility]!!

## 2019-11-08 NOTE — H&P (View-Only) (Signed)
Primary Care Provider: Jeanette Caprice, PA-C Cardiologist: Glenetta Hew, MD Electrophysiologist: None  Clinic Note: Chief Complaint  Patient presents with  . Hospitalization Follow-up    Inpatient consult; seen in the ER  . Atrial Fibrillation    New diagnosis   HPI:    Shirley Bates is a 86 y.o. female with a PMH notable for dementia, HTN, HLD with recent hospitalization for cough & dyspnea - noted to be in Afib RVR (with HFpEF Sx) who presents today for hospital f/u.  Recent Hospitalizations: Admitted 10/12/2019, noted to be in A. fib RVR.  UA showed bacteriuria most likely colonization.  Shirley Bates was seen in consultation in the emergency room.  She was brought in by EMS for dyspnea (gasping for air).  She did noted to 3 days of feeling congested with dry cough.  By the time EMS got there she was gasping for air was noted to be tachycardic and in A. fib RVR.  Rate as high as 170s by EMS.  She noted mild orthopnea but no PND and minimal edema.  Not aware of palpitations. ->  Was started on diltiazem drip only added low-dose Lopressor.  Started on Eliquis 2.5 mg twice daily.  Was given IV Lasix in the ER and home losartan continued.  She was converted to combination of oral diltiazem plus metoprolol.  Converted to oral Lasix-reduce to 20 mg daily CHA2DS2-VASc score was 5 (7.2 % stroke rate/year; CHF, female, age x2, HTN)) -> Eliquis 2.5 mg twice daily  Underwent TEE cardioversion requiring amiodarone.  Therefore diltiazem discontinued.  TEE EF is mildly lower.  Plan 1-month follow-up.  Converted to oral amiodarone load 200 mg twice daily for 7 days then 200 mg daily.  Home dose losartan reduced to 25 mg.  Discharge Toprol dose 75 mg  She was discharged on home oxygen.  Reviewed  CV studies:    The following studies were reviewed today: (if available, images/films reviewed: From Epic Chart or Care Everywhere) . 2D Echo (10/12/2019):: EF 40 and 45%.  Moderate biatrial  enlargement.  Mild to moderate AI. Marland Kitchen TEE-DCCV (10/16/2019): EF estimated 30 to 35% moderate decreased function.  Moderately reduced RV function.  No LAA thrombus.  Mild MR with multiple jets.  Mild to moderate TR.  Grade 2 aortic atheroma.  Post procedure patient had little bursts of A. fib.  Was given 150 amiodarone bolus  Interval History:   Shirley Bates returns here today for follow-up.  She has pretty significant edema worse on the right than the left.  She still has some exertional dyspnea, but is not all that active.  She does use a rolling walker, and notes easy fatigue.  She felt okay when she first went home.  She really has not noticed any irregular heartbeats or palpitations.  Maybe a little bit more short of breath after the first 2 days of going home, but has not really noted any chest pain or pressure with rest or exertion.  The biggest issue has been the swelling and may be a little of orthopnea.  She has been a little bit more fatigued, but not necessarily noting exertional dyspnea.  CV Review of Symptoms (Summary) Cardiovascular ROS: positive for - dyspnea on exertion, edema, orthopnea, shortness of breath and Exertional fatigue, significant edema negative for - chest pain, irregular heartbeat, palpitations, paroxysmal nocturnal dyspnea, rapid heart rate, shortness of breath or Syncope or near syncope, TIA/amaurosis fugax, claudication  The patient does not have symptoms  concerning for COVID-19 infection (fever, chills, cough, or new shortness of breath).  The patient is practicing social distancing & Masking.    REVIEWED OF SYSTEMS   Review of Systems  Constitutional: Positive for malaise/fatigue. Negative for weight loss (Weight is actually up).  HENT: Negative for congestion and nosebleeds.   Respiratory: Positive for cough. Negative for shortness of breath.   Cardiovascular: Positive for leg swelling.  Gastrointestinal: Negative for abdominal pain, blood in stool and  melena.  Genitourinary: Negative for hematuria.  Musculoskeletal: Positive for joint pain. Negative for falls.  Neurological: Positive for weakness (Legs awake). Negative for dizziness (Poor balance.  Uses walker) and focal weakness.  Psychiatric/Behavioral: Positive for memory loss. Negative for depression. The patient has insomnia (Has daytime sleepiness). The patient is not nervous/anxious.     I have reviewed and (if needed) personally updated the patient's problem list, medications, allergies, past medical and surgical history, social and family history.   PAST MEDICAL HISTORY   Past Medical History:  Diagnosis Date  . Atrial fibrillation status post cardioversion ([place]) 10/12/2019   With recurrent A. fib post cardioversion (was placed on amiodarone and had TEE cardioversion)  . Cancer ([place])   . Hyperlipidemia   . Hypertension     PAST SURGICAL HISTORY   Past Surgical History:  Procedure Laterality Date  . 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 CARDIOVERSION N/A 10/16/2019   Procedure: CARDIOVERSION;  Surgeon: Skeet Latch, MD;  Location: [place];  Service: Cardiovascular;;;  Post procedure patient had little bursts of A. fib.  Was given 150 amiodarone bolus  . TEE WITHOUT CARDIOVERSION N/A 10/16/2019   Procedure: TRANSESOPHAGEAL ECHOCARDIOGRAM (TEE);  Surgeon: Skeet Latch, MD;  Location: [place];  Service: Cardiovascular;;  EF estimated 30 to 35% moderate decreased function.  Moderately reduced RV function.  No LAA thrombus.  Mild MR with multiple jets.  Mild to moderate TR.  Grade 2 aortic atheroma.   . TRANSTHORACIC ECHOCARDIOGRAM  10/12/2019   EF 40 and 45%.  Moderate biatrial enlargement.  Mild to moderate AI.    MEDICATIONS/ALLERGIES   Current Meds  Medication Sig  . acetaminophen (TYLENOL) 500 MG tablet Take 500 mg by mouth every 6 (six) hours as needed for mild pain.  Marland Kitchen alendronate (FOSAMAX) 70 MG tablet Take 1 tablet (70 mg total) by mouth  every 7 (seven) days. Take with a full glass of water on an empty stomach. (Patient not taking: Reported on 11/13/2019)  . aluminum-magnesium hydroxide-simethicone (MAALOX) 935-701-77 MG/5ML SUSP Take 30 mLs by mouth every 6 (six) hours as needed (indigestion).  Marland Kitchen guaiFENesin-dextromethorphan (ROBITUSSIN DM) 100-10 MG/5ML syrup Take 5 mLs by mouth every 4 (four) hours as needed for cough.  . loperamide (IMODIUM) 2 MG capsule Take 2 mg by mouth as needed for diarrhea or loose stools (total 8 doses in 24 HR).  . magnesium hydroxide (MILK OF MAGNESIA) 400 MG/5ML suspension Take 30 mLs by mouth at bedtime as needed for mild constipation.  . multivitamin-iron-minerals-folic acid (CENTRUM) chewable tablet Chew 1 tablet by mouth daily.  Marland Kitchen neomycin-bacitracin-polymyxin (NEOSPORIN) ointment Apply 1 application topically as needed for wound care.  . NONFORMULARY OR COMPOUNDED ITEM Apply 1 application topically at bedtime. Antifungal solution: Terbinafine 3%, Fluconazole 2%, Tea Tree Oil 5%, Urea 10%, Ibuprofen 2% in DMSO suspension #44mL  Apply at bedtime to all toenails except right 1st toe  . Omega-3 Fatty Acids (FISH OIL) 1200 MG CAPS Take 1,200 mg by mouth daily.  (Patient  not taking: Reported on 11/13/2019)  . simvastatin (ZOCOR) 20 MG tablet TAKE 1 TABLET BY MOUTH AT BEDTIME (Patient taking differently: Take 20 mg by mouth at bedtime. )  . tetrahydrozoline 0.05 % ophthalmic solution Place 1 drop into both eyes in the morning.   . [DISCONTINUED] amiodarone (PACERONE) 200 MG tablet 200 mg twice a day for 7 days then 200 mg daily (Patient taking differently: Take 200 mg by mouth daily. )  . [DISCONTINUED] apixaban (ELIQUIS) 2.5 MG TABS tablet Take 1 tablet (2.5 mg total) by mouth 2 (two) times daily. (Patient taking differently: Take 2.5 mg by mouth 2 (two) times daily. (0600) & (1900))  . [DISCONTINUED] ferrous sulfate 325 (65 FE) MG tablet Take 1 tablet (325 mg total) by mouth daily with breakfast. (Patient  taking differently: Take 325 mg by mouth daily with breakfast. (0800))  . [DISCONTINUED] furosemide (LASIX) 20 MG tablet Take 1 tablet (20 mg total) by mouth daily.  . [DISCONTINUED] furosemide (LASIX) 20 MG tablet Take 20 mg everyday except on Mon, Wed,and Fri. Take 40 mg ( 2 tablets).  If weight is greater than 3 lbs or increase swelling  Take 40 40 mg for 3 days then return daily dosing.  . [DISCONTINUED] losartan (COZAAR) 25 MG tablet Take 1 tablet (25 mg total) by mouth daily. (Patient taking differently: Take 25 mg by mouth daily. (0800))  . [DISCONTINUED] metoprolol succinate (TOPROL-XL) 25 MG 24 hr tablet Take 3 tablets (75 mg total) by mouth at bedtime.    No Known Allergies  SOCIAL HISTORY/FAMILY HISTORY   Social History   Tobacco Use  . Smoking status: Never Smoker  . Smokeless tobacco: Never Used  Substance Use Topics  . Alcohol use: No  . Drug use: No   Social History   Social History Narrative   She has been very independent was living on her own until January and February 2021.  She moved into the [facility].   CODE STATUS is DNR.    family history includes Heart disease in her brother, father, and sister; Hypertension in her brother.  No known history of A. fib   OBJCTIVE -PE, EKG, labs   Wt Readings from Last 3 Encounters:  11/08/19 124 lb (56.2 kg)  10/17/19 118 lb 9.6 oz (53.8 kg)  03/19/19 112 lb (50.8 kg)    Physical Exam: BP 122/76   Pulse 89   Ht 5\' 2"  (1.575 m)   Wt 124 lb (56.2 kg)   SpO2 100%   BMI 22.68 kg/m  Physical Exam Vitals reviewed.  Constitutional:      General: She is not in acute distress.    Appearance: Normal appearance. She is normal weight. She is ill-appearing (She seems a little bit worn out or tired appearing.). She is not toxic-appearing.  HENT:     Head: Normocephalic and atraumatic.  Neck:     Vascular: Hepatojugular reflux and JVD (7-8 cmH2O, cannon A waves) present. No carotid bruit.  Cardiovascular:     Rate  and Rhythm: Normal rate. Rhythm irregularly irregular.  No extrasystoles are present.    Chest Wall: PMI is not displaced.     Pulses: Decreased pulses (Mildly decreased pedal pulses).     Heart sounds: Murmur (1-2/6 SEM at RUSB, 1/6 at apex) heard.   Pulmonary:     Effort: Pulmonary effort is normal. No respiratory distress.     Breath sounds: Normal breath sounds.  Chest:     Chest wall: No tenderness.  Musculoskeletal:        General: No deformity. Normal range of motion.     Right lower leg: Edema (3+ up to ankle) present.     Left lower leg: Edema (2+-to left knee) present.  Neurological:     General: No focal deficit present.     Mental Status: She is alert.     Comments: Oriented to date, location but not necessarily time  Psychiatric:     Comments: She falls asleep in the middle of our talk.  Daughter answers most of the questions.  Does not really answer questions.     Adult ECG Report  Rate: 89;  Rhythm: atrial fibrillation, premature ventricular contractions (PVC) and Controlled rate; normal axis, intervals durations.  Narrative Interpretation: A. fib is replaced sinus rhythm post cardioversion  Recent Labs:    Lab Results  Component Value Date   CHOL 118 10/13/2019   HDL 43 10/13/2019   LDLCALC 66 10/13/2019   TRIG 46 10/13/2019   CHOLHDL 2.7 10/13/2019   Lab Results  Component Value Date   CREATININE 0.82 11/08/2019   BUN 12 11/08/2019   NA 137 11/08/2019   K 4.9 11/08/2019   CL 98 11/08/2019   CO2 28 11/08/2019   Lab Results  Component Value Date   TSH 1.279 10/12/2019    ASSESSMENT/PLAN    Problem List Items Addressed This Visit    Atrial fibrillation, new onset ([place]) - Primary (Chronic)    Newly identified back in July when she came in with heart failure symptoms and A. fib RVR.  She underwent TEE/DCCV and was placed on amiodarone because of frequent ectopy postop.  She is now back in A. fib, and it may just be that she has not had enough  amiodarone loading.  Plan:   Continue amiodarone for rhythm control.  And increase from 75 to 100 mg Toprol for rate control.  Continue Eliquis.  Plan will be to schedule for cardioversion next week which would put her just at 4 weeks post initiation of Eliquis..    Following cardioversion, recheck 2D echo to better assess EF.  For now continue amiodarone for rhythm control  If she has recurrence of A. fib even on amiodarone, I would then simply plan rate control.      Relevant Medications   metoprolol succinate (TOPROL-XL) 100 MG 24 hr tablet   furosemide (LASIX) 20 MG tablet   Other Relevant Orders   EKG 12-Lead (Completed)   ECHOCARDIOGRAM COMPLETE   Basic metabolic panel (Completed)   CBC (Completed)   ELECTRICAL CARDIOVERSION   Dilated cardiomyopathy (HCC) (Chronic)    I suspect reduced EF is probably related to A. fib.  Needs to be rechecked once we have restored sinus rhythm.  Plan: For now continue metoprolol plus losartan.  Would probably not try to titrate to [facility] unless we clearly have evidence of reduced EF on long-term. She is currently on furosemide 20 mg a day.  We will increase to 40 mg a day for 3 days out of the week in light of her worsening edema. Would like to try to avoid polypharmacy on a 86 year old.  Will probably hold off on converting to [facility] or adding spironolactone.      Relevant Medications   metoprolol succinate (TOPROL-XL) 100 MG 24 hr tablet   furosemide (LASIX) 20 MG tablet   Essential hypertension (Chronic)    Blood pressure looks very good.  She was switched off of diltiazem in the hospital because  of reduced EF.  Is on 75 mg Toprol +25 mg losartan.      Relevant Medications   metoprolol succinate (TOPROL-XL) 100 MG 24 hr tablet   furosemide (LASIX) 20 MG tablet   Hyperlipidemia (Chronic)    She is on simvastatin, moderate dose.  Unless there is evidence of CAD, I think we can probably stop that going down the road.       Relevant Medications   metoprolol succinate (TOPROL-XL) 100 MG 24 hr tablet   furosemide (LASIX) 20 MG tablet   Fluid overload    Still has volume overload.  Probably related to the dilated cardiomyopathy.  Not really having heart failure symptoms of PND and orthopnea.  Plan: We will increase furosemide dosing to 40 mg 3 days a week.  Also discussed sliding scale.          COVID-19 Education: The signs and symptoms of COVID-19 were discussed with the patient and how to seek care for testing (follow up with PCP or arrange E-visit).   The importance of social distancing and COVID-19 vaccination was discussed today.  I spent a total of 40minutes with the patient in direct patient consultation.  Additional time spent with chart review  / charting (studies, outside notes, etc): 18 Total Time: 42 min   Current medicines are reviewed at length with the patient today.  (+/- concerns) not sure what to do with diuretic  Notice: This dictation was prepared with Dragon dictation along with smaller phrase technology. Any transcriptional errors that result from this process are unintentional and may not be corrected upon review.  Patient Instructions / Medication Changes & Studies & Tests Ordered   Patient Instructions    Medication Instructions:   Stop Metoprolol succinate 75 mg Start Metoprolol Succinate 100 mg at bedtime  Change in Lasix dosing -Take 20 mg everyday except on Mon, Wed,and Fri. Take 40 mg ( 2 tablets). If weight is greater than 3 lbs or increase swelling Take  40 mg for 3 days then return daily dosing.   *If you need a refill on your cardiac medications before your next appointment, please call your pharmacy*   Lab Work: BMP CBC today  If you have labs (blood work) drawn today and your tests are completely normal, you will receive your results only by: Marland Kitchen MyChart Message (if you have MyChart) OR . A paper copy in the mail If you have any lab test that is abnormal  or we need to change your treatment, we will call you to review the results.   Testing/Procedures:  will be schedule at [place] for cardioversion Your physician has recommended that you have a Cardioversion (DCCV). Electrical Cardioversion uses a jolt of electricity to your heart either through paddles or wired patches attached to your chest. This is a controlled, usually prescheduled, procedure. Defibrillation is done under light anesthesia in the hospital, and you usually go home the day of the procedure. This is done to get your heart back into a normal rhythm. You are not awake for the procedure. Please see the instruction sheet given to you today.  And  Will be schedule in 3 months at [place] has requested that you have an echocardiogram. Echocardiography is a painless test that uses sound waves to create images of your heart. It provides your doctor with information about the size and shape of your heart and how well your heart's chambers and valves are  working. This procedure takes approximately one hour. There are no restrictions for this procedure.  Follow-Up: At [facility], you and your health needs are our priority.  As part of our continuing mission to provide you with exceptional heart care, we have created designated Provider Care Teams.  These Care Teams include your primary Cardiologist (physician) and Advanced Practice Providers (APPs -  Physician Assistants and Nurse Practitioners) who all work together to provide you with the care you need, when you need it.    Your next appointment:   2 week(s)  The format for your next appointment:   In Person  Provider:   Zandra Abts, or Mingo Amber  NP   Other Instructions Your physician recommends that you schedule a follow-up appointment in 3 months with Dr Ellyn Hack  After ECHO   Dear  Ms Encarnacion Slates are scheduled for a TEE/Cardioversion/TEE  Cardioversion on Aug 26 ,2021 with Dr. Cathie Olden.  Please arrive at the [facility] (Main Entrance A) at [facility]: [address] [place], [place] [address] at 9:30 am.  DIET: Nothing to eat or drink after midnight except a sip of water with medications (see medication instructions below)  Medication Instructions:  Continue your anticoagulant: ELiquis 2.5 mg  You will need to continue your anticoagulant after your procedure until you are told by your  Provider that it is safe to stop   Labs:cbc , bmp  Today  At [facility] in the office   Covid testing  is need on Monday  AUG 23,2021 at [place] - please fax results to [phone]  You must have a responsible person to drive you home and stay in the waiting area during your procedure. Failure to do so could result in cancellation.  Bring your insurance cards.  *Special Note: Every effort is made to have your procedure done on time. Occasionally there are emergencies that occur at the hospital that may cause delays. Please be patient if a delay does occur.      Studies Ordered:   Orders Placed This Encounter  Procedures  . ELECTRICAL CARDIOVERSION  . Basic metabolic panel  . CBC  . EKG 12-Lead  . ECHOCARDIOGRAM COMPLETE     Glenetta Hew, M.D., M.S. Interventional Cardiologist   Pager # [phone] Phone # [phone] [address]. [place], [place] [address]   Thank you for choosing Heart[facility] at [facility]!!

## 2019-11-08 NOTE — Telephone Encounter (Signed)
   Pt's daughter calling, she said she has question about pt's covid test for upcoming procedure, she would like to speak with RN Ivin Booty

## 2019-11-08 NOTE — Patient Instructions (Addendum)
Medication Instructions:   Stop Metoprolol succinate 75 mg Start Metoprolol Succinate 100 mg at bedtime  Change in Lasix dosing -Take 20 mg everyday except on Mon, Wed,and Fri. Take 40 mg ( 2 tablets). If weight is greater than 3 lbs or increase swelling Take  40 mg for 3 days then return daily dosing.   *If you need a refill on your cardiac medications before your next appointment, please call your pharmacy*   Lab Work: BMP CBC today  If you have labs (blood work) drawn today and your tests are completely normal, you will receive your results only by: Marland Kitchen MyChart Message (if you have MyChart) OR . A paper copy in the mail If you have any lab test that is abnormal or we need to change your treatment, we will call you to review the results.   Testing/Procedures:  will be schedule at [place] for cardioversion Your physician has recommended that you have a Cardioversion (DCCV). Electrical Cardioversion uses a jolt of electricity to your heart either through paddles or wired patches attached to your chest. This is a controlled, usually prescheduled, procedure. Defibrillation is done under light anesthesia in the hospital, and you usually go home the day of the procedure. This is done to get your heart back into a normal rhythm. You are not awake for the procedure. Please see the instruction sheet given to you today.  And  Will be schedule in 3 months at [place] has requested that you have an echocardiogram. Echocardiography is a painless test that uses sound waves to create images of your heart. It provides your doctor with information about the size and shape of your heart and how well your heart's chambers and valves are working. This procedure takes approximately one hour. There are no restrictions for this procedure.  Follow-Up: At [facility], you and your health needs are our priority.  As part of our continuing  mission to provide you with exceptional heart care, we have created designated Provider Care Teams.  These Care Teams include your primary Cardiologist (physician) and Advanced Practice Providers (APPs -  Physician Assistants and Nurse Practitioners) who all work together to provide you with the care you need, when you need it.    Your next appointment:   2 week(s)  The format for your next appointment:   In Person  Provider:   Zandra Abts, or Mingo Amber  NP   Other Instructions Your physician recommends that you schedule a follow-up appointment in 3 months with Dr Ellyn Hack  After ECHO   Dear  Ms Encarnacion Slates are scheduled for a TEE/Cardioversion/TEE Cardioversion on Aug 26 ,2021 with Dr. Cathie Olden.  Please arrive at the [facility] (Main Entrance A) at [facility]: [address] [place], [place] [address] at 9:30 am.  DIET: Nothing to eat or drink after midnight except a sip of water with medications (see medication instructions below)  Medication Instructions:  Continue your anticoagulant: ELiquis 2.5 mg  You will need to continue your anticoagulant after your procedure until you are told by your  Provider that it is safe to stop   Labs:cbc , bmp  Today  At [facility] in the office   Covid testing  is need on Monday  AUG 23,2021 at [place] - please fax results to [phone]  You must have a responsible person to drive you home and stay in the waiting area during  your procedure. Failure to do so could result in cancellation.  Bring your insurance cards.  *Special Note: Every effort is made to have your procedure done on time. Occasionally there are emergencies that occur at the hospital that may cause delays. Please be patient if a delay does occur.

## 2019-11-09 NOTE — Telephone Encounter (Signed)
Spoke to daughter [name]. She states she spoke to representative Shauntae at [facility] ,  [employer] is able to do a covid testing . They will need to know where to send the report . RN informed Daughter , will contact [facility] and get details. Mickel Baas  States to speak with Marry Guan or [facility] with orders. Phone [phone]

## 2019-11-09 NOTE — Telephone Encounter (Signed)
Spoke to [place]. She states they do have the rapid 15 minutes a form testing. RN asked if they have a form that indicate the  results  that can be faxed .  Jenny Reichmann states they do not have a form,but transferred RN to to speak to [facility] EDOptician, dispensing).  Per Pamala Hurry,  The facility is able to do testing on Aug 23,2021 and will place  information on a note that has their letter-head. Will fax to office.  fax and phone number given  aware RN will call if information is not received by end of day AUG 23,2021

## 2019-11-12 ENCOUNTER — Telehealth: Payer: Self-pay | Admitting: *Deleted

## 2019-11-12 ENCOUNTER — Other Ambulatory Visit: Payer: Self-pay | Admitting: *Deleted

## 2019-11-12 NOTE — Progress Notes (Signed)
Open in error

## 2019-11-12 NOTE — Telephone Encounter (Signed)
Follow up  [facility] calling. She said she needs Ivin Booty e-mail so she can e-mail pt's covid test result.

## 2019-11-12 NOTE — Telephone Encounter (Signed)
RN called [facility] to confirm that patient had a Rapid covid test today.  Per Pamala Hurry , patient did have test completed. Result will be faxed to cardiology  office today .   patient is schedule for Cardioversion for 11/15/19  At 10 :30 am

## 2019-11-13 NOTE — Telephone Encounter (Signed)
Late entry  Spoke to Shirley Bates on 11/12/19.  Informed her RN does not have E mail to send results . Please fax information to given fax number [phone].   Shirley Bates verbalized understanding.

## 2019-11-13 NOTE — Telephone Encounter (Signed)
Received  Information of rapid covid test . inforamtion will be scan to patient 's chart.

## 2019-11-14 ENCOUNTER — Telehealth: Payer: Self-pay | Admitting: Cardiology

## 2019-11-14 ENCOUNTER — Other Ambulatory Visit: Payer: Self-pay

## 2019-11-14 ENCOUNTER — Encounter: Payer: Self-pay | Admitting: Cardiology

## 2019-11-14 MED ORDER — APIXABAN 2.5 MG PO TABS: 2.5000 mg | ORAL_TABLET | Freq: Two times a day (BID) | ORAL | 0 refills | Status: AC

## 2019-11-14 MED ORDER — FERROUS SULFATE 325 (65 FE) MG PO TABS: 325.0000 mg | ORAL_TABLET | Freq: Every day | ORAL | 11 refills | Status: AC

## 2019-11-14 MED ORDER — LOSARTAN POTASSIUM 25 MG PO TABS: 25.0000 mg | ORAL_TABLET | Freq: Every day | ORAL | 11 refills | Status: DC

## 2019-11-14 MED ORDER — APIXABAN 2.5 MG PO TABS: 2.5000 mg | ORAL_TABLET | Freq: Two times a day (BID) | ORAL | 1 refills | Status: DC

## 2019-11-14 MED ORDER — AMIODARONE HCL 200 MG PO TABS: ORAL_TABLET | ORAL | 11 refills | Status: DC

## 2019-11-14 NOTE — Telephone Encounter (Signed)
*  STAT* If patient is at the pharmacy, call can be transferred to refill team.   1. Which medications need to be refilled? (please list name of each medication and dose if known)  amiodarone (PACERONE) 200 MG tablet ferrous sulfate 325 (65 FE) MG tablet  apixaban (ELIQUIS) 2.5 MG TABS tablet losartan (COZAAR) 25 MG tablet  2. Which pharmacy/location (including street and city if local pharmacy) is medication to be sent to?  [place], [place] - [address] AT [place]  3. Do they need a 30 day or 90 day supply? 90 day   Patient is out of medication

## 2019-11-14 NOTE — Assessment & Plan Note (Signed)
I suspect reduced EF is probably related to A. fib.  Needs to be rechecked once we have restored sinus rhythm.  Plan: For now continue metoprolol plus losartan.  Would probably not try to titrate to [facility] unless we clearly have evidence of reduced EF on long-term. She is currently on furosemide 20 mg a day.  We will increase to 40 mg a day for 3 days out of the week in light of her worsening edema. Would like to try to avoid polypharmacy on a 86 year old.  Will probably hold off on converting to [facility] or adding spironolactone.

## 2019-11-14 NOTE — Assessment & Plan Note (Signed)
Still has volume overload.  Probably related to the dilated cardiomyopathy.  Not really having heart failure symptoms of PND and orthopnea.  Plan: We will increase furosemide dosing to 40 mg 3 days a week.  Also discussed sliding scale.

## 2019-11-14 NOTE — Assessment & Plan Note (Signed)
Blood pressure looks very good.  She was switched off of diltiazem in the hospital because of reduced EF.  Is on 75 mg Toprol +25 mg losartan.

## 2019-11-14 NOTE — Assessment & Plan Note (Addendum)
Newly identified back in July when she came in with heart failure symptoms and A. fib RVR.  She underwent TEE/DCCV and was placed on amiodarone because of frequent ectopy postop.  She is now back in A. fib, and it may just be that she has not had enough amiodarone loading.  Plan:   Continue amiodarone for rhythm control.  And increase from 75 to 100 mg Toprol for rate control.  Continue Eliquis.  Plan will be to schedule for cardioversion next week which would put her just at 4 weeks post initiation of Eliquis..    Following cardioversion, recheck 2D echo to better assess EF.  For now continue amiodarone for rhythm control  If she has recurrence of A. fib even on amiodarone, I would then simply plan rate control.

## 2019-11-14 NOTE — Assessment & Plan Note (Addendum)
She is on simvastatin, moderate dose.  Unless there is evidence of CAD, I think we can probably stop that going down the road.

## 2019-11-15 MED ORDER — AMIODARONE HCL 200 MG PO TABS: 200.0000 mg | ORAL_TABLET | Freq: Every day | ORAL | 3 refills | Status: AC

## 2019-11-16 ENCOUNTER — Ambulatory Visit (HOSPITAL_COMMUNITY): Payer: Medicare Other | Admitting: Anesthesiology

## 2019-11-16 ENCOUNTER — Encounter (HOSPITAL_COMMUNITY)
Admission: RE | Disposition: A | Discharge status: Skilled Nursing Facility | Payer: Self-pay | Source: Home / Self Care | Attending: Cardiovascular Disease

## 2019-11-16 ENCOUNTER — Other Ambulatory Visit: Payer: Self-pay

## 2019-11-16 ENCOUNTER — Encounter (HOSPITAL_COMMUNITY): Payer: Self-pay | Admitting: Cardiovascular Disease

## 2019-11-16 ENCOUNTER — Ambulatory Visit (HOSPITAL_COMMUNITY)
Admission: RE | Admit: 2019-11-16 | Discharge: 2019-11-16 | Disposition: A | Discharge status: Skilled Nursing Facility | Payer: Medicare Other | Attending: Cardiovascular Disease | Admitting: Cardiovascular Disease

## 2019-11-16 DIAGNOSIS — Z79899 Other long term (current) drug therapy: Secondary | ICD-10-CM | POA: Diagnosis not present

## 2019-11-16 DIAGNOSIS — I110 Hypertensive heart disease with heart failure: Secondary | ICD-10-CM | POA: Insufficient documentation

## 2019-11-16 DIAGNOSIS — Z20822 Contact with and (suspected) exposure to covid-19: Secondary | ICD-10-CM | POA: Diagnosis not present

## 2019-11-16 DIAGNOSIS — I4891 Unspecified atrial fibrillation: Secondary | ICD-10-CM | POA: Insufficient documentation

## 2019-11-16 DIAGNOSIS — I4819 Other persistent atrial fibrillation: Secondary | ICD-10-CM | POA: Diagnosis not present

## 2019-11-16 DIAGNOSIS — Z7901 Long term (current) use of anticoagulants: Secondary | ICD-10-CM | POA: Insufficient documentation

## 2019-11-16 DIAGNOSIS — Z89421 Acquired absence of other right toe(s): Secondary | ICD-10-CM | POA: Diagnosis not present

## 2019-11-16 DIAGNOSIS — I5032 Chronic diastolic (congestive) heart failure: Secondary | ICD-10-CM | POA: Diagnosis not present

## 2019-11-16 DIAGNOSIS — F0390 Unspecified dementia without behavioral disturbance: Secondary | ICD-10-CM | POA: Diagnosis not present

## 2019-11-16 DIAGNOSIS — E785 Hyperlipidemia, unspecified: Secondary | ICD-10-CM | POA: Diagnosis not present

## 2019-11-16 DIAGNOSIS — Z8249 Family history of ischemic heart disease and other diseases of the circulatory system: Secondary | ICD-10-CM | POA: Insufficient documentation

## 2019-11-16 DIAGNOSIS — I2510 Atherosclerotic heart disease of native coronary artery without angina pectoris: Secondary | ICD-10-CM | POA: Insufficient documentation

## 2019-11-16 DIAGNOSIS — I420 Dilated cardiomyopathy: Secondary | ICD-10-CM | POA: Diagnosis not present

## 2019-11-16 HISTORY — PX: CARDIOVERSION: SHX1299

## 2019-11-16 LAB — SARS CORONAVIRUS 2 BY RT PCR (HOSPITAL ORDER, PERFORMED IN ~~LOC~~ HOSPITAL LAB): SARS Coronavirus 2: NEGATIVE

## 2019-11-16 SURGERY — CARDIOVERSION
Anesthesia: General

## 2019-11-16 MED ORDER — PROPOFOL 10 MG/ML IV BOLUS
INTRAVENOUS | Status: DC | PRN
  Administered 2019-11-16: 30 mg via INTRAVENOUS

## 2019-11-16 MED ORDER — LIDOCAINE 2% (20 MG/ML) 5 ML SYRINGE
INTRAMUSCULAR | Status: DC | PRN
  Administered 2019-11-16: 40 mg via INTRAVENOUS

## 2019-11-16 NOTE — CV Procedure (Signed)
    Cardioversion Note  Shirley Bates [id number] 1926/02/29  Procedure: DC Cardioversion Indications:  Atrial fib   Procedure Details Consent: Obtained Time Out: Verified patient identification, verified procedure, site/side was marked, verified correct patient position, special equipment/implants available, Radiology Safety Procedures followed,  medications/allergies/relevent history reviewed, required imaging and test results available.  Performed  The patient has been on adequate anticoagulation.  The patient received IV Lidocaine 40 mg IV followed by Propofol 30 mg IV  for sedation.  Synchronous cardioversion was performed at  120  joules.  The cardioversion was successful  She had NSR, PVCs, PAC.  Was bradycardic following the cardioversion  Will DC the Toprol XL 100 mg a day Continue amiodarone 200 mg a day  She may be able to reduce her dose to 100 mg a day at a later time    Complications: No apparent complications Patient did tolerate procedure well.   Thayer Headings, Brooke Bonito., MD, [facility] 11/16/2019, 10:31 AM

## 2019-11-16 NOTE — Anesthesia Postprocedure Evaluation (Signed)
Anesthesia Post Note  Patient: Shirley Bates  Procedure(s) Performed: CARDIOVERSION (N/A )     Patient location during evaluation: Endoscopy Anesthesia Type: General Level of consciousness: awake and alert Pain management: pain level controlled Vital Signs Assessment: post-procedure vital signs reviewed and stable Respiratory status: spontaneous breathing, nonlabored ventilation, respiratory function stable and patient connected to nasal cannula oxygen Cardiovascular status: blood pressure returned to baseline and stable Postop Assessment: no apparent nausea or vomiting Anesthetic complications: no   No complications documented.  Last Vitals:  Vitals:   11/16/19 1105 11/16/19 1115  BP: 123/63 97/63  Pulse: 68 (!) 52  Resp: 17 (!) 24  Temp:    SpO2: 98% 98%    Last Pain:  Vitals:   11/16/19 1115  TempSrc:   PainSc: 0-No pain                 Neyah Ellerman L Orlena Garmon

## 2019-11-16 NOTE — Anesthesia Preprocedure Evaluation (Addendum)
Anesthesia Evaluation  Patient identified by MRN, date of birth, ID band Patient awake    Reviewed: Allergy & Precautions, NPO status , Patient's Chart, lab work & pertinent test results, reviewed documented beta blocker date and time   Airway Mallampati: II  TM Distance: >3 FB Neck ROM: Full    Dental  (+) Lower Dentures, Dental Advisory Given   Pulmonary neg pulmonary ROS,    Pulmonary exam normal breath sounds clear to auscultation       Cardiovascular hypertension, Pt. on home beta blockers and Pt. on medications Normal cardiovascular exam+ dysrhythmias Atrial Fibrillation  Rhythm:Irregular Rate:Normal  HLD  TEE 09/2019 1. Left ventricular ejection fraction, by estimation, is 30 to 35%. The left ventricle has moderately decreased function. The left ventricle demonstrates global hypokinesis.  2. Right ventricular systolic function is moderately reduced. The right ventricular size is normal.  3. No left atrial/left atrial appendage thrombus was detected. The LAA emptying velocity was 62 cm/s.  4. Multiple regurgitant jets. The mitral valve is normal in structure. Mild mitral valve regurgitation. No evidence of mitral stenosis.  5. Tricuspid valve regurgitation is mild to moderate.  6. The aortic valve is tricuspid. Aortic valve regurgitation is mild to moderate. No aortic stenosis is present.  7. There is mild (Grade II) layered plaque involving the transverse aorta and descending aorta.  8. The inferior vena cava is normal in size with greater than 50% respiratory variability, suggesting right atrial pressure of 3 mmHg.    Neuro/Psych negative neurological ROS  negative psych ROS   GI/Hepatic negative GI ROS, Neg liver ROS,   Endo/Other  negative endocrine ROS  Renal/GU negative Renal ROS  negative genitourinary   Musculoskeletal  (+) Arthritis ,   Abdominal   Peds  Hematology  (+) Blood dyscrasia (on  eliquis), ,   Anesthesia Other Findings   Reproductive/Obstetrics                           Anesthesia Physical Anesthesia Plan  ASA: III  Anesthesia Plan: General   Post-op Pain Management:    Induction: Intravenous  PONV Risk Score and Plan: 3 and Propofol infusion and Treatment may vary due to age or medical condition  Airway Management Planned: Natural Airway  Additional Equipment:   Intra-op Plan:   Post-operative Plan:   Informed Consent: I have reviewed the patients History and Physical, chart, labs and discussed the procedure including the risks, benefits and alternatives for the proposed anesthesia with the patient or authorized representative who has indicated his/her understanding and acceptance.     Dental advisory given  Plan Discussed with: CRNA  Anesthesia Plan Comments:         Anesthesia Quick Evaluation

## 2019-11-16 NOTE — Discharge Instructions (Signed)
Electrical Cardioversion Electrical cardioversion is the delivery of a jolt of electricity to restore a normal rhythm to the heart. A rhythm that is too fast or is not regular keeps the heart from pumping well. In this procedure, sticky patches or metal paddles are placed on the chest to deliver electricity to the heart from a device. Follow these instructions at home:  Do not drive for 24 hours if you were given a sedative during your procedure.  Take over-the-counter and prescription medicines only as told by your health care provider.  Ask your health care provider how to check your pulse. Check it often.  Rest for 48 hours after the procedure or as told by your health care provider.  Avoid or limit your caffeine use as told by your health care provider.  Keep all follow-up visits as told by your health care provider. This is important. Contact a health care provider if:  You feel like your heart is beating too quickly or your pulse is not regular.  You have a serious muscle cramp that does not go away. Get help right away if:  You have discomfort in your chest.  You are dizzy or you feel faint.  You have trouble breathing or you are short of breath.  Your speech is slurred.  You have trouble moving an arm or leg on one side of your body.  Your fingers or toes turn cold or blue. Summary  Electrical cardioversion is the delivery of a jolt of electricity to restore a normal rhythm to the heart.  This procedure may be done right away in an emergency or may be a scheduled procedure if the condition is not an emergency.  Generally, this is a safe procedure.  After the procedure, check your pulse often as told by your health care provider.

## 2019-11-16 NOTE — Interval H&P Note (Signed)
History and Physical Interval Note:  11/16/2019 9:22 AM  Shirley Bates  has presented today for surgery, with the diagnosis of AFIB.  The various methods of treatment have been discussed with the patient and family. After consideration of risks, benefits and other options for treatment, the patient has consented to  Procedure(s): CARDIOVERSION (N/A) as a surgical intervention.  The patient's history has been reviewed, patient examined, no change in status, stable for surgery.  I have reviewed the patient's chart and labs.  Questions were answered to the patient's satisfaction.     Mertie Moores

## 2019-11-16 NOTE — Transfer of Care (Signed)
Immediate Anesthesia Transfer of Care Note  Patient: Shirley Bates  Procedure(s) Performed: CARDIOVERSION (N/A )  Patient Location: Endoscopy Unit  Anesthesia Type:General  Level of Consciousness: sedated, patient cooperative and responds to stimulation  Airway & Oxygen Therapy: Patient Spontanous Breathing and Patient connected to nasal cannula oxygen  Post-op Assessment: Report given to RN and Post -op Vital signs reviewed and stable  Post vital signs: Reviewed and stable  Last Vitals:  Vitals Value Taken Time  BP    Temp    Pulse    Resp    SpO2      Last Pain:  Vitals:   11/16/19 0931  TempSrc: Oral  PainSc: 0-No pain         Complications: No complications documented.

## 2019-11-18 ENCOUNTER — Encounter (HOSPITAL_COMMUNITY): Payer: Self-pay | Admitting: Emergency Medicine

## 2019-11-18 ENCOUNTER — Other Ambulatory Visit: Payer: Self-pay

## 2019-11-18 ENCOUNTER — Other Ambulatory Visit (HOSPITAL_COMMUNITY): Payer: Self-pay

## 2019-11-18 ENCOUNTER — Emergency Department (HOSPITAL_COMMUNITY): Payer: Medicare Other

## 2019-11-18 ENCOUNTER — Observation Stay (HOSPITAL_COMMUNITY)
Admission: EM | Admit: 2019-11-18 | Discharge: 2019-11-19 | Disposition: A | Discharge status: Home / Self Care | Payer: Medicare Other | Attending: Internal Medicine | Admitting: Internal Medicine

## 2019-11-18 DIAGNOSIS — I5023 Acute on chronic systolic (congestive) heart failure: Principal | ICD-10-CM | POA: Insufficient documentation

## 2019-11-18 DIAGNOSIS — Z859 Personal history of malignant neoplasm, unspecified: Secondary | ICD-10-CM | POA: Diagnosis not present

## 2019-11-18 DIAGNOSIS — F0390 Unspecified dementia without behavioral disturbance: Secondary | ICD-10-CM | POA: Diagnosis not present

## 2019-11-18 DIAGNOSIS — Z79899 Other long term (current) drug therapy: Secondary | ICD-10-CM | POA: Diagnosis not present

## 2019-11-18 DIAGNOSIS — I509 Heart failure, unspecified: Secondary | ICD-10-CM | POA: Diagnosis not present

## 2019-11-18 DIAGNOSIS — I110 Hypertensive heart disease with heart failure: Secondary | ICD-10-CM | POA: Insufficient documentation

## 2019-11-18 DIAGNOSIS — I5021 Acute systolic (congestive) heart failure: Secondary | ICD-10-CM | POA: Diagnosis present

## 2019-11-18 DIAGNOSIS — Z20822 Contact with and (suspected) exposure to covid-19: Secondary | ICD-10-CM | POA: Diagnosis not present

## 2019-11-18 DIAGNOSIS — R0602 Shortness of breath: Secondary | ICD-10-CM | POA: Diagnosis present

## 2019-11-18 LAB — BASIC METABOLIC PANEL
Anion gap: 9 (ref 5–15)
BUN: 36 mg/dL — ABNORMAL HIGH (ref 8–23)
CO2: 23 mmol/L (ref 22–32)
Calcium: 8.7 mg/dL — ABNORMAL LOW (ref 8.9–10.3)
Chloride: 100 mmol/L (ref 98–111)
Creatinine, Ser: 1.18 mg/dL — ABNORMAL HIGH (ref 0.44–1.00)
GFR calc Af Amer: 46 mL/min — ABNORMAL LOW (ref >60)
GFR calc non Af Amer: 40 mL/min — ABNORMAL LOW (ref >60)
Glucose, Bld: 136 mg/dL — ABNORMAL HIGH (ref 70–99)
Potassium: 4 mmol/L (ref 3.5–5.1)
Sodium: 132 mmol/L — ABNORMAL LOW (ref 135–145)

## 2019-11-18 LAB — CBC WITH DIFFERENTIAL/PLATELET
Abs Immature Granulocytes: 0.04 10*3/uL (ref 0.00–0.07)
Basophils Absolute: 0 10*3/uL (ref 0.0–0.1)
Basophils Relative: 0 %
Eosinophils Absolute: 0 10*3/uL (ref 0.0–0.5)
Eosinophils Relative: 0 %
HCT: 33.6 % — ABNORMAL LOW (ref 36.0–46.0)
Hemoglobin: 10.8 g/dL — ABNORMAL LOW (ref 12.0–15.0)
Immature Granulocytes: 1 %
Lymphocytes Relative: 10 %
Lymphs Abs: 0.9 10*3/uL (ref 0.7–4.0)
MCH: 29.6 pg (ref 26.0–34.0)
MCHC: 32.1 g/dL (ref 30.0–36.0)
MCV: 92.1 fL (ref 80.0–100.0)
Monocytes Absolute: 0.6 10*3/uL (ref 0.1–1.0)
Monocytes Relative: 7 %
Neutro Abs: 7.1 10*3/uL (ref 1.7–7.7)
Neutrophils Relative %: 82 %
Platelets: 222 10*3/uL (ref 150–400)
RBC: 3.65 MIL/uL — ABNORMAL LOW (ref 3.87–5.11)
RDW: 17.2 % — ABNORMAL HIGH (ref 11.5–15.5)
WBC: 8.6 10*3/uL (ref 4.0–10.5)
nRBC: 0 % (ref 0.0–0.2)

## 2019-11-18 LAB — IRON AND TIBC
Iron: 34 ug/dL (ref 28–170)
Saturation Ratios: 11 % (ref 10.4–31.8)
TIBC: 309 ug/dL (ref 250–450)
UIBC: 275 ug/dL

## 2019-11-18 LAB — TROPONIN I (HIGH SENSITIVITY)
Troponin I (High Sensitivity): 29 ng/L — ABNORMAL HIGH (ref <18)
Troponin I (High Sensitivity): 25 ng/L — ABNORMAL HIGH (ref <18)

## 2019-11-18 LAB — SARS CORONAVIRUS 2 BY RT PCR (HOSPITAL ORDER, PERFORMED IN ~~LOC~~ HOSPITAL LAB): SARS Coronavirus 2: NEGATIVE

## 2019-11-18 LAB — TSH: TSH: 1.398 u[IU]/mL (ref 0.350–4.500)

## 2019-11-18 LAB — BRAIN NATRIURETIC PEPTIDE: B Natriuretic Peptide: 1923.7 pg/mL — ABNORMAL HIGH (ref 0.0–100.0)

## 2019-11-18 LAB — FERRITIN: Ferritin: 67 ng/mL (ref 11–307)

## 2019-11-18 MED ORDER — AMIODARONE HCL 200 MG PO TABS: 200.0000 mg | ORAL_TABLET | Freq: Every day | ORAL | Status: DC

## 2019-11-18 MED ORDER — AEROCHAMBER PLUS FLO-VU LARGE MISC
1.0000 | Freq: Once | Status: AC
  Administered 2019-11-18: 1

## 2019-11-18 MED ORDER — ALBUTEROL SULFATE HFA 108 (90 BASE) MCG/ACT IN AERS
2.0000 | INHALATION_SPRAY | Freq: Once | RESPIRATORY_TRACT | Status: AC
  Administered 2019-11-18: 2 via RESPIRATORY_TRACT
  Filled 2019-11-18: qty 6.7

## 2019-11-18 MED ORDER — AMIODARONE HCL 200 MG PO TABS
200.0000 mg | ORAL_TABLET | Freq: Every day | ORAL | Status: DC
  Administered 2019-11-18 – 2019-11-19 (×2): 200 mg via ORAL
  Filled 2019-11-18 (×2): qty 1

## 2019-11-18 MED ORDER — ACETAMINOPHEN 325 MG PO TABS: 650.0000 mg | ORAL_TABLET | ORAL | Status: DC | PRN

## 2019-11-18 MED ORDER — LOSARTAN POTASSIUM 25 MG PO TABS
25.0000 mg | ORAL_TABLET | Freq: Every day | ORAL | Status: DC
  Administered 2019-11-18 – 2019-11-19 (×2): 25 mg via ORAL
  Filled 2019-11-18 (×2): qty 1

## 2019-11-18 MED ORDER — FUROSEMIDE 10 MG/ML IJ SOLN: 40.0000 mg | Freq: Two times a day (BID) | INTRAMUSCULAR | Status: DC

## 2019-11-18 MED ORDER — FUROSEMIDE 10 MG/ML IJ SOLN
40.0000 mg | Freq: Two times a day (BID) | INTRAMUSCULAR | Status: DC
  Administered 2019-11-18 – 2019-11-19 (×2): 40 mg via INTRAVENOUS
  Filled 2019-11-18 (×2): qty 4

## 2019-11-18 MED ORDER — SODIUM CHLORIDE 0.9 % IV SOLN
510.0000 mg | Freq: Once | INTRAVENOUS | Status: AC
  Administered 2019-11-18: 510 mg via INTRAVENOUS
  Filled 2019-11-18: qty 17

## 2019-11-18 MED ORDER — LOSARTAN POTASSIUM 50 MG PO TABS: 25.0000 mg | ORAL_TABLET | Freq: Every day | ORAL | Status: DC

## 2019-11-18 MED ORDER — SODIUM CHLORIDE 0.9% FLUSH: 3.0000 mL | INTRAVENOUS | Status: DC | PRN

## 2019-11-18 MED ORDER — APIXABAN 2.5 MG PO TABS
2.5000 mg | ORAL_TABLET | Freq: Two times a day (BID) | ORAL | Status: DC
  Administered 2019-11-18 – 2019-11-19 (×3): 2.5 mg via ORAL
  Filled 2019-11-18 (×4): qty 1

## 2019-11-18 MED ORDER — FUROSEMIDE 10 MG/ML IJ SOLN
40.0000 mg | Freq: Once | INTRAMUSCULAR | Status: AC
  Administered 2019-11-18: 40 mg via INTRAVENOUS
  Filled 2019-11-18: qty 4

## 2019-11-18 MED ORDER — SODIUM CHLORIDE 0.9 % IV SOLN: 250.0000 mL | INTRAVENOUS | Status: DC | PRN

## 2019-11-18 MED ORDER — NAPHAZOLINE-GLYCERIN 0.012-0.2 % OP SOLN
1.0000 [drp] | Freq: Four times a day (QID) | OPHTHALMIC | Status: DC | PRN
  Filled 2019-11-18: qty 15

## 2019-11-18 MED ORDER — LIVING BETTER WITH HEART FAILURE BOOK: Freq: Once | Status: DC

## 2019-11-18 MED ORDER — SIMVASTATIN 20 MG PO TABS
20.0000 mg | ORAL_TABLET | Freq: Every day | ORAL | Status: DC
  Administered 2019-11-18: 20 mg via ORAL
  Filled 2019-11-18: qty 1

## 2019-11-18 MED ORDER — ONDANSETRON HCL 4 MG/2ML IJ SOLN: 4.0000 mg | Freq: Four times a day (QID) | INTRAMUSCULAR | Status: DC | PRN

## 2019-11-18 MED ORDER — SODIUM CHLORIDE 0.9% FLUSH
3.0000 mL | Freq: Two times a day (BID) | INTRAVENOUS | Status: DC
  Administered 2019-11-18 – 2019-11-19 (×3): 3 mL via INTRAVENOUS

## 2019-11-18 NOTE — Progress Notes (Signed)
RN notified [facility]MT[facility] of patients arrival to unit.

## 2019-11-18 NOTE — ED Provider Notes (Signed)
07:00: Assumed care of patient from Shirley Lange PA-C pending remaining labs & admission.   H&P per prior provider.  Briefly patient is a 86 yo female who presented to the ED for increased dyspnea & appearing blue at the facility. On 2L of oxygen via [place] @ baseline.   ED Course/Procedures     Results for orders placed or performed during the hospital encounter of 11/18/19  CBC with Differential  Result Value Ref Range   WBC 8.6 4.0 - 10.5 K/uL   RBC 3.65 (L) 3.87 - 5.11 MIL/uL   Hemoglobin 10.8 (L) 12.0 - 15.0 g/dL   HCT 33.6 (L) 36 - 46 %   MCV 92.1 80.0 - 100.0 fL   MCH 29.6 26.0 - 34.0 pg   MCHC 32.1 30.0 - 36.0 g/dL   RDW 17.2 (H) 11.5 - 15.5 %   Platelets 222 150 - 400 K/uL   nRBC 0.0 0.0 - 0.2 %   Neutrophils Relative % 82 %   Neutro Abs 7.1 1.7 - 7.7 K/uL   Lymphocytes Relative 10 %   Lymphs Abs 0.9 0.7 - 4.0 K/uL   Monocytes Relative 7 %   Monocytes Absolute 0.6 0 - 1 K/uL   Eosinophils Relative 0 %   Eosinophils Absolute 0.0 0 - 0 K/uL   Basophils Relative 0 %   Basophils Absolute 0.0 0 - 0 K/uL   Immature Granulocytes 1 %   Abs Immature Granulocytes 0.04 0.00 - 0.07 K/uL  Basic metabolic panel  Result Value Ref Range   Sodium 132 (L) 135 - 145 mmol/L   Potassium 4.0 3.5 - 5.1 mmol/L   Chloride 100 98 - 111 mmol/L   CO2 23 22 - 32 mmol/L   Glucose, Bld 136 (H) 70 - 99 mg/dL   BUN 36 (H) 8 - 23 mg/dL   Creatinine, Ser 1.18 (H) 0.44 - 1.00 mg/dL   Calcium 8.7 (L) 8.9 - 10.3 mg/dL   GFR calc non Af Amer 40 (L) >60 mL/min   GFR calc Af Amer 46 (L) >60 mL/min   Anion gap 9 5 - 15  Brain natriuretic peptide  Result Value Ref Range   B Natriuretic Peptide 1,923.7 (H) 0.0 - 100.0 pg/mL  Troponin I (High Sensitivity)  Result Value Ref Range   Troponin I (High Sensitivity) 29 (H) <18 ng/L  Troponin I (High Sensitivity)  Result Value Ref Range   Troponin I (High Sensitivity) 25 (H) <18 ng/L   DG Chest 2 View  Result Date: 11/18/2019 CLINICAL DATA:  Shortness of  breath EXAM: CHEST - 2 VIEW COMPARISON:  10/12/2019 FINDINGS: Cardiomegaly with small bilateral pleural effusions. Vascular congestion with diffuse interstitial edema. Consolidations within the left greater than right lung base. Aortic atherosclerosis. IMPRESSION: 1. Cardiomegaly with vascular congestion, diffuse interstitial edema, and small bilateral pleural effusions. 2. Bibasilar atelectasis or pneumonia. Electronically Signed   By: Donavan Foil M.D.   On: 11/18/2019 03:31    Procedures  MDM   Last Echo was 2 days prior- EF 30-35%, was admitted for cardioversion.  Cardiomegaly with vascular congestion, diffuse interstitial edema, and small bilateral pleural effusions noted on CXR w/ elevated BNP, also has mildly elevated troponin likely secondary to demand. AKI present as well.   Admit for CHF exacerbation, was given 40 of Lasix in the ED.   Discussed with internal medicine team- accept admission.   Shirley Bates was evaluated in Emergency Department on 11/18/2019 for the symptoms described in the history  of present illness. He/she was evaluated in the context of the global COVID-19 pandemic, which necessitated consideration that the patient might be at risk for infection with the SARS-CoV-2 virus that causes COVID-19. Institutional protocols and algorithms that pertain to the evaluation of patients at risk for COVID-19 are in a state of rapid change based on information released by regulatory bodies including the CDC and federal and state organizations. These policies and algorithms were followed during the patient's care in the ED.     [name] 11/18/19 1225    Merryl Hacker, MD 11/20/19 [phone]

## 2019-11-18 NOTE — ED Notes (Signed)
Shirley Bates daughter [phone] aware pt just arrived, would like an update whenever there is one

## 2019-11-18 NOTE — ED Notes (Signed)
Patient transported to X-ray via stretcher 

## 2019-11-18 NOTE — ED Notes (Signed)
This RN provided Pt's daughter with updates on pt's care.

## 2019-11-18 NOTE — ED Notes (Signed)
Spoke with [employer] and provided update on pt.

## 2019-11-18 NOTE — ED Triage Notes (Signed)
From [employer], EMS called for SOB and chest pain.  Pt denied any issues.  Does have a strong sent of UTI.  Pt is very agitated and and more altered than normal (hx of dementia).    Cardioversion done on Friday.    148/90 AFIB in 5) 96% on 2L (uses as needed at home.)

## 2019-11-18 NOTE — ED Provider Notes (Signed)
[facility] EMERGENCY DEPARTMENT Provider Note   CSN: [id number] Arrival date & time: 11/18/19  0149     History Chief Complaint  Patient presents with   Shortness of Breath    Shirley Bates is a 86 y.o. female.  Patient to ED from [facility] for evaluation of episode of SoB that occurred earlier this evening described as "patient turned blue". She is reported to be on 2 L O2 chronically, and episode was resolved with turning her oxygen up to 6 L per nursing home staff. They also report she complained of pain in her foot, where they found a small spider. Unclear whether she was bitten or not. No complaint of chest pain. Patient with history of advanced dementia and cannot contribute to history. No recent fever, no vomiting, no new altered mental status.   The history is provided by the EMS personnel and a caregiver.       Past Medical History:  Diagnosis Date   Atrial fibrillation status post cardioversion ([place]) 10/12/2019   With recurrent A. fib post cardioversion (was placed on amiodarone and had TEE cardioversion)   Cancer [facility])    Hyperlipidemia    Hypertension     Patient Active Problem List   Diagnosis Date Noted   Persistent atrial fibrillation ([place])    Atrial fibrillation, new onset ([place]) 11/08/2019   Impaired mobility and activities of daily living 10/14/2019   Atrial fibrillation with RVR ([place]) 10/12/2019   Fluid overload 10/12/2019   Normocytic anemia 10/12/2019   Dilated cardiomyopathy ([place]) 10/12/2019   Osteoporosis 05/28/2015   Left ear impacted cerumen 12/12/2012   OA (osteoarthritis) of neck 12/12/2012   Hyperlipidemia    Essential hypertension     Past Surgical History:  Procedure Laterality Date   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 fib.  Was given 150 amiodarone bolus   TEE WITHOUT CARDIOVERSION N/A 10/16/2019   Procedure: TRANSESOPHAGEAL ECHOCARDIOGRAM (TEE);  Surgeon: Skeet Latch, MD;  Location: [place];  Service: Cardiovascular;;  EF estimated 30 to 35% moderate decreased function.  Moderately reduced RV function.  No LAA thrombus.  Mild MR with multiple jets.  Mild to moderate TR.  Grade 2 aortic atheroma.    TRANSTHORACIC ECHOCARDIOGRAM  10/12/2019   EF 40 and 45%.  Moderate biatrial enlargement.  Mild to moderate AI.     OB History   No obstetric history on file.     Family History  Problem Relation Age of Onset   Heart disease Father    Heart disease Sister    Heart disease Brother    Hypertension Brother    Breast cancer Neg Hx     Social History   Tobacco Use   Smoking status: Never Smoker   Smokeless tobacco: Never Used  Substance Use Topics   Alcohol use: No   Drug use: No    Home Medications Prior to Admission medications   Medication Sig Start Date End Date Taking? Authorizing Provider  acetaminophen (TYLENOL) 500 MG tablet Take 500 mg by mouth every 6 (six) hours as needed for mild pain.   Yes [provider]  aluminum-magnesium hydroxide-simethicone (MAALOX) 200-200-20 MG/5ML SUSP Take 30 mLs by mouth every 6 (six) hours as needed (indigestion).   Yes [provider]  amiodarone (PACERONE)  200 MG tablet Take 1 tablet (200 mg total) by mouth daily. 11/15/19  Yes Leonie Man, MD  apixaban (ELIQUIS) 2.5 MG TABS tablet Take 1 tablet (2.5 mg total) by mouth 2 (two) times daily. 11/14/19  Yes Leonie Man, MD  ferrous sulfate 325 (65 FE) MG tablet Take 1 tablet (325 mg total) by mouth daily with breakfast. 11/14/19  Yes Leonie Man, MD  furosemide (LASIX) 20 MG tablet Take 20 mg everyday except on Mon, Wed,and Fri. Take 40 mg ( 2 tablets). If weight is greater than 3 lbs or increase swelling. Take 40 mg for 3 days then  return daily dosing. Patient taking differently: Take by mouth. Take 20 mg by mouth on Tuesday, Thursday, Saturday and Sunday. If weight is greater than 3 lbs or increase swelling, take 40 mg for 3 days then return daily dosing. 11/08/19  Yes Leonie Man, MD  guaiFENesin-dextromethorphan [facility] DM) 100-10 MG/5ML syrup Take 5 mLs by mouth every 4 (four) hours as needed for cough. 10/17/19  Yes Aline August, MD  loperamide (IMODIUM) 2 MG capsule Take 2 mg by mouth as needed for diarrhea or loose stools (total 8 doses in 24 HR).   Yes [provider]  losartan (COZAAR) 25 MG tablet Take 1 tablet (25 mg total) by mouth daily. 11/14/19  Yes Leonie Man, MD  magnesium hydroxide (MILK OF MAGNESIA) 400 MG/5ML suspension Take 30 mLs by mouth at bedtime as needed for mild constipation.   Yes [provider]  metoprolol succinate (TOPROL-XL) 100 MG 24 hr tablet Take 100 mg by mouth daily. Take with or immediately following a meal.   Yes [provider]  Multi Vit-Fluoride-Folic Acid (MULTIVITAMIN/FLUORIDE PO) Take 1 tablet by mouth every morning. Chewable   Yes [provider]  multivitamin-iron-minerals-folic acid (CENTRUM) chewable tablet Chew 1 tablet by mouth daily.   Yes [provider]  neomycin-bacitracin-polymyxin (NEOSPORIN) ointment Apply 1 application topically as needed for wound care.   Yes [provider]  NONFORMULARY OR COMPOUNDED ITEM Apply 1 application topically at bedtime. Antifungal solution: Terbinafine 3%, Fluconazole 2%, Tea Tree Oil 5%, Urea 10%, Ibuprofen 2% in DMSO suspension #37mL  Apply at bedtime to all toenails except right 1st toe 10/17/19  Yes Aline August, MD  simvastatin (ZOCOR) 20 MG tablet TAKE 1 TABLET BY MOUTH AT BEDTIME Patient taking differently: Take 20 mg by mouth at bedtime.  02/07/19  Yes Susy Frizzle, MD  tetrahydrozoline 0.05 % ophthalmic solution Place 1 drop into both eyes in the morning.     Yes [provider]  alendronate (FOSAMAX) 70 MG tablet Take 1 tablet (70 mg total) by mouth every 7 (seven) days. Take with a full glass of water on an empty stomach. Patient not taking: Reported on 11/13/2019 05/14/19   Susy Frizzle, MD  Omega-3 Fatty Acids (FISH OIL) 1200 MG CAPS Take 1,200 mg by mouth daily.  Patient not taking: Reported on 11/13/2019    [provider]    Allergies    Patient has no known allergies.  Review of Systems   Review of Systems  Unable to perform ROS: Dementia    Physical Exam Updated Vital Signs BP 105/73 (BP Location: Right Arm)    Pulse (!) 50    Temp 98.1 F (36.7 C) (Oral)    Resp 17    Ht 5\' 2"  (1.575 m)    Wt 56.2 kg    SpO2 97%  BMI 22.66 kg/m   Physical Exam Vitals and nursing note reviewed.  Constitutional:      Appearance: She is well-developed.  HENT:     Head: Normocephalic.  Cardiovascular:     Rate and Rhythm: Normal rate and regular rhythm.  Pulmonary:     Effort: Pulmonary effort is normal.     Breath sounds: Normal breath sounds. No wheezing, rhonchi or rales.  Chest:     Chest wall: No tenderness.  Abdominal:     General: Bowel sounds are normal.     Palpations: Abdomen is soft.     Tenderness: There is no abdominal tenderness. There is no guarding or rebound.  Musculoskeletal:        General: Normal range of motion.     Cervical back: Normal range of motion and neck supple.     Right lower leg: No edema.     Left lower leg: No edema.  Skin:    General: Skin is warm and dry.  Neurological:     Mental Status: She is alert.  Psychiatric:        Behavior: Behavior is agitated.     Comments: Patient screams with any attempt at physical care or treatment, "don't touch me, leave me alone."     ED Results / Procedures / Treatments   Labs (all labs ordered are listed, but only abnormal results are displayed) Labs Reviewed  CBC WITH DIFFERENTIAL/PLATELET - Abnormal; Notable for the following  components:      Result Value   RBC 3.65 (*)    Hemoglobin 10.8 (*)    HCT 33.6 (*)    RDW 17.2 (*)    All other components within normal limits  BASIC METABOLIC PANEL  BRAIN NATRIURETIC PEPTIDE  TROPONIN I (HIGH SENSITIVITY)  TROPONIN I (HIGH SENSITIVITY)    EKG None  Radiology DG Chest 2 View  Result Date: 11/18/2019 CLINICAL DATA:  Shortness of breath EXAM: CHEST - 2 VIEW COMPARISON:  10/12/2019 FINDINGS: Cardiomegaly with small bilateral pleural effusions. Vascular congestion with diffuse interstitial edema. Consolidations within the left greater than right lung base. Aortic atherosclerosis. IMPRESSION: 1. Cardiomegaly with vascular congestion, diffuse interstitial edema, and small bilateral pleural effusions. 2. Bibasilar atelectasis or pneumonia. Electronically Signed   By: Donavan Foil M.D.   On: 11/18/2019 03:31    Procedures Procedures (including critical care time)  Medications Ordered in ED Medications - No data to display  ED Course  I have reviewed the triage vital signs and the nursing notes.  Pertinent labs & imaging results that were available during my care of the patient were reviewed by me and considered in my medical decision making (see chart for details).    MDM Rules/Calculators/A&P                          Patient sent to eD from SNF for evaluation of episode of SOB, possible cyanosis, and c/o CP. No complaint of CP here. Patient is quite demented and does not contribute to history.   She is found to have ss/sxs fluid overload with LE edema, rales on auscultation and history as per SNF of SOB. Per chart review, she was cardioverted 2 days ago at which time her ECHO showed EF 30-35%. Her BNP in the ED is 1923. She is on 20 mg Lasix daily - 40 mg given in ED.   Labs pending at end of shift. Patient care signed out to [facility], PA-C,  for result of final labs and eminent admission.  Final Clinical Impression(s) / ED Diagnoses Final diagnoses:    None   1. CHF  Rx / DC Orders ED Discharge Orders    None       [name], PA-C 11/22/19 0447    Merryl Hacker, MD 11/28/19 3254    Merryl Hacker, MD 11/28/19 [phone]

## 2019-11-18 NOTE — H&P (Addendum)
Date: 11/18/2019               Patient Name:  Shirley Bates MRN: [id number]  DOB: 11-22-1925 Age / Sex: 86 y.o., female   PCP: Jeanette Caprice, PA-C              Medical Service: Internal Medicine Teaching Service              Attending Physician: Dr. Aldine Contes    First Contact: Pearla Dubonnet, MS III Pager: [phone]  Second Contact: Dr. Sanjuan Dame Pager: [phone]  Third Contact Dr. Gilberto Better Pager: [phone]       After Hours (After 5p/  First Contact Pager: [phone]  weekends / holidays): Second Contact Pager: [phone]   Chief Complaint: Shortness of Breath  History of Present Illness: Ms. Shirley Bates is a 86 y.o. female with a history of heart failure (EF 30-35% 09/2019), atrial fibrillation with RVR (s/p cardioversion two days ago), hypertension, and hyperlipidemia who was found to be hypoxic and short of breath at the assisted living facility where she lives. EMS was called and she was brought to [facility] via ambulance. She was anxious upon arrival but has since returned to her cognitive baseline. She is accompanied by her daughter and healthcare POA Mickel Baas who assisted in giving her history.  Of note, she had a cardioversion for atrial fibrillation two days ago by Dr. Acie Fredrickson. She was found to be bradycardic post-procedure and her home metoprolol was DC'd at that time. This is the only recent change in her medication regimen.  She endorses bilateral LE edema, which her daughter notes in improved since last night. She denies any chest pain, recent viral illness, sick contacts, or changes in her diet. She adheres strictly to a low-sodium diet and her nutrition is monitored by the staff at her assisted living facility.  She was O2 challenged during the interview and her sats dropped to 92% on room air. Her home O2 requirement is reported to be 2L, but she does not use her home oxygen because she does not like the nasal cannula.   Meds:  Current Meds  Medication Sig  .  acetaminophen (TYLENOL) 500 MG tablet Take 500 mg by mouth every 6 (six) hours as needed for mild pain.  Marland Kitchen aluminum-magnesium hydroxide-simethicone (MAALOX) 026-378-58 MG/5ML SUSP Take 30 mLs by mouth every 6 (six) hours as needed (indigestion).  Marland Kitchen amiodarone (PACERONE) 200 MG tablet Take 1 tablet (200 mg total) by mouth daily.  Marland Kitchen apixaban (ELIQUIS) 2.5 MG TABS tablet Take 1 tablet (2.5 mg total) by mouth 2 (two) times daily.  . ferrous sulfate 325 (65 FE) MG tablet Take 1 tablet (325 mg total) by mouth daily with breakfast.  . furosemide (LASIX) 20 MG tablet Take 20 mg everyday except on Mon, Wed,and Fri. Take 40 mg ( 2 tablets). If weight is greater than 3 lbs or increase swelling. Take 40 mg for 3 days then return daily dosing. (Patient taking differently: Take by mouth. Take 20 mg by mouth on Tuesday, Thursday, Saturday and Sunday. If weight is greater than 3 lbs or increase swelling, take 40 mg for 3 days then return daily dosing.)  . guaiFENesin-dextromethorphan (ROBITUSSIN DM) 100-10 MG/5ML syrup Take 5 mLs by mouth every 4 (four) hours as needed for cough.  . loperamide (IMODIUM) 2 MG capsule Take 2 mg by mouth as needed for diarrhea or loose stools (total 8 doses in 24 HR).  Marland Kitchen losartan (COZAAR) 25 MG tablet Take  1 tablet (25 mg total) by mouth daily.  . magnesium hydroxide (MILK OF MAGNESIA) 400 MG/5ML suspension Take 30 mLs by mouth at bedtime as needed for mild constipation.  . metoprolol succinate (TOPROL-XL) 100 MG 24 hr tablet Take 100 mg by mouth daily. Take with or immediately following a meal.  . Multi Vit-Fluoride-Folic Acid (MULTIVITAMIN/FLUORIDE PO) Take 1 tablet by mouth every morning. Chewable  . multivitamin-iron-minerals-folic acid (CENTRUM) chewable tablet Chew 1 tablet by mouth daily.  Marland Kitchen neomycin-bacitracin-polymyxin (NEOSPORIN) ointment Apply 1 application topically as needed for wound care.  . NONFORMULARY OR COMPOUNDED ITEM Apply 1 application topically at bedtime.  Antifungal solution: Terbinafine 3%, Fluconazole 2%, Tea Tree Oil 5%, Urea 10%, Ibuprofen 2% in DMSO suspension #28mL  Apply at bedtime to all toenails except right 1st toe  . simvastatin (ZOCOR) 20 MG tablet TAKE 1 TABLET BY MOUTH AT BEDTIME (Patient taking differently: Take 20 mg by mouth at bedtime. )  . tetrahydrozoline 0.05 % ophthalmic solution Place 1 drop into both eyes in the morning.      Allergies: Allergies as of 11/18/2019  . (No Known Allergies)   Past Medical History:  Diagnosis Date  . Atrial fibrillation status post cardioversion ([place]) 10/12/2019   With recurrent A. fib post cardioversion (was placed on amiodarone and had TEE cardioversion)  . Cancer ([place])   . Hyperlipidemia   . Hypertension     Family History: Sister who had "heart problems" for the last 20 years of her life. No family history of diabetes, cancers, or other heart disease.   Social History: She is a never smoker and has never overused alcohol. Though she lives in a communal living facility, she has had limited contact with other residents due to COVID protocols.   Review of Systems: A complete ROS was negative except as per HPI.   Physical Exam: Blood pressure (!) 150/74, pulse (!) 49, temperature 98.1 F (36.7 C), temperature source Oral, resp. rate 20, height 5\' 2"  (1.575 m), weight 56.2 kg, SpO2 97 %.   Physical Exam Constitutional:      General: She is not in acute distress. Neck:     Vascular: JVD present.  Cardiovascular:     Rate and Rhythm: Regular rhythm. Bradycardia present.     Pulses:          Radial pulses are 2+ on the right side and 2+ on the left side.       Dorsalis pedis pulses are 1+ on the right side and 1+ on the left side.       Posterior tibial pulses are 1+ on the right side and 1+ on the left side.     Heart sounds: Normal heart sounds.  Pulmonary:     Comments: Diffuse rhonchi and end-expiratory wheeze noted Skin:    Comments: Lower extremities cold and dry  without significant edema  Neurological:     Mental Status: She is alert.  Psychiatric:        Mood and Affect: Mood normal.        Speech: Speech normal.        Behavior: Behavior is not agitated.        Cognition and Memory: Cognition is impaired.       EKG: personally reviewed my interpretation is sinus bradycardia with prolonged QTc.  CXR: personally reviewed my interpretation is cardiomegaly with diffuse interstitial fluid and small R pleural effusion. L costophrenic angle not visualized.   Assessment & Plan by Problem: Acute on  Chronic Systolic Heart Failure:  Uncertain whether her increased symptoms are due to a true exacerbation vs. an interruption in her medication schedule during her brief hospital stay for cardioversion two days ago. Last echocardiogram on 10/16/19 with EF of 30-35%. LE edema is much improved after one dose of Lasix 40mg  IV. Will continue with diuresis to get fluid off her lungs and reduce her O2 requirement. Repeat Echo not warranted at this time.   History of Afib, s/p cardioversion: Maintaining sinus bradycardia currently, will continue to monitor and will continue Eliquis 2.5 mg BID for anticoagulation.   Dementia: Though she was acutely agitated during EMS transport and upon arrival at the hospital, she has since returned to her cognitive baseline. Her acute agitation was likely due to anxiety surrounding her illness and an incomplete understanding of the reasons for her transport.   Hypertension:  Currently on home does of Losartan 25 mg daily. Though her systolic pressures have been on the high side (150s during this admission), this is likely to compensate for the bradycardia she developed post-cardioversion and we do not want to decrease her cardiac output by lowering her BP at this time.   Hyperlipidemia:  Well-controlled on home dose of simvastatin 20mg  QHS. Continue home medication regimen.    Dispo: Admit patient to Observation with expected  length of stay less than 2 midnights.  Signed: Pearla Dubonnet, Medical Student 11/18/2019, 7:34 AM  Pager: [phone]  Attestation for Student Documentation I personally was present and performed or re-performed the history, physical exam and medical decision-making activities of this service and have verified that the service and findings are accurately documented in the student's note  Ms. Corene Resnick is a 86 y.o. female with a history of heart failure (EF 30-35% 09/2019), atrial fibrillation with RVR (s/p cardioversion two days ago), hypertension, and hyperlipidemia presenting to [facility] w/ dyspnea. Found to have elevated BNP and pulmonary edema on X-ray. Possibly from missing home meds in setting of recent procedure. Only 1.3kg above her dry weight. Physical exam appear fairly close to euvolemia. Hopefully won't need more than 1 day of diuresis.   Mosetta Anis, MD 11/18/2019, 5:31 PM Pager: [phone] After 5pm on weekdays and 1pm on weekends: On Call Pager: [phone]

## 2019-11-18 NOTE — Plan of Care (Signed)
  Problem: Education: Goal: Knowledge of General Education information will improve Description: Including pain rating scale, medication(s)/side effects and non-pharmacologic comfort measures Outcome: Progressing   Problem: Activity: Goal: Risk for activity intolerance will decrease Outcome: Progressing   Problem: Nutrition: Goal: Adequate nutrition will be maintained Outcome: Progressing   

## 2019-11-19 DIAGNOSIS — Z7901 Long term (current) use of anticoagulants: Secondary | ICD-10-CM

## 2019-11-19 DIAGNOSIS — I5023 Acute on chronic systolic (congestive) heart failure: Principal | ICD-10-CM

## 2019-11-19 DIAGNOSIS — Z8679 Personal history of other diseases of the circulatory system: Secondary | ICD-10-CM

## 2019-11-19 DIAGNOSIS — F0390 Unspecified dementia without behavioral disturbance: Secondary | ICD-10-CM | POA: Diagnosis not present

## 2019-11-19 LAB — CBC
HCT: 34.2 % — ABNORMAL LOW (ref 36.0–46.0)
Hemoglobin: 10.8 g/dL — ABNORMAL LOW (ref 12.0–15.0)
MCH: 29.2 pg (ref 26.0–34.0)
MCHC: 31.6 g/dL (ref 30.0–36.0)
MCV: 92.4 fL (ref 80.0–100.0)
Platelets: 213 10*3/uL (ref 150–400)
RBC: 3.7 MIL/uL — ABNORMAL LOW (ref 3.87–5.11)
RDW: 17.4 % — ABNORMAL HIGH (ref 11.5–15.5)
WBC: 8.8 10*3/uL (ref 4.0–10.5)
nRBC: 0 % (ref 0.0–0.2)

## 2019-11-19 LAB — BASIC METABOLIC PANEL
Anion gap: 8 (ref 5–15)
BUN: 29 mg/dL — ABNORMAL HIGH (ref 8–23)
CO2: 29 mmol/L (ref 22–32)
Calcium: 8.5 mg/dL — ABNORMAL LOW (ref 8.9–10.3)
Chloride: 99 mmol/L (ref 98–111)
Creatinine, Ser: 1.21 mg/dL — ABNORMAL HIGH (ref 0.44–1.00)
GFR calc Af Amer: 45 mL/min — ABNORMAL LOW (ref >60)
GFR calc non Af Amer: 39 mL/min — ABNORMAL LOW (ref >60)
Glucose, Bld: 145 mg/dL — ABNORMAL HIGH (ref 70–99)
Potassium: 3.7 mmol/L (ref 3.5–5.1)
Sodium: 136 mmol/L (ref 135–145)

## 2019-11-19 LAB — MAGNESIUM: Magnesium: 2.2 mg/dL (ref 1.7–2.4)

## 2019-11-19 NOTE — Progress Notes (Signed)
   Subjective:  Ms. Usery was sitting up comfortably and eating breakfast during the interview. Per her daughter Mickel Baas, she is doing much better this morning. She was O2 challenged overnight and was unable to maintain O2 sats on less than 2L, but this is her reported home O2 need. We discussed the importance of her using her home O2 regularly despite her dislike for the nasal cannula.  Ms. Aden denies any dyspnea or chest pain this morning. She feels that she is ready to return to [facility] today.   Objective:  Vital signs in last 24 hours: Vitals:   11/18/19 1944 11/19/19 0111 11/19/19 0438 11/19/19 0830  BP: 129/67 (!) 170/76 (!) 174/109 (!) 187/74  Pulse: (!) 48 (!) 57 (!) 57 (!) 51  Resp: 18 18 18 18   Temp: 98.4 F (36.9 C) (!) 97.4 F (36.3 C) 97.6 F (36.4 C) 98 F (36.7 C)  TempSrc: Oral Oral Oral Oral  SpO2: 97%  98% 96%  Weight:  56.7 kg    Height:       Weight change: 1.3 kg  Intake/Output Summary (Last 24 hours) at 11/19/2019 1056 Last data filed at 11/19/2019 0900 Gross per 24 hour  Intake 720 ml  Output --  Net 720 ml   Physical Exam Vitals and nursing note reviewed.  Constitutional:      General: She is not in acute distress. Cardiovascular:     Rate and Rhythm: Regular rhythm. Bradycardia present.     Heart sounds: Normal heart sounds.  Pulmonary:     Effort: Pulmonary effort is normal.     Breath sounds: Rhonchi present.  Abdominal:     General: Bowel sounds are normal.     Palpations: Abdomen is soft.  Musculoskeletal:     Right lower leg: No tenderness. No edema.     Left lower leg: No tenderness. No edema.  Neurological:     Mental Status: She is alert.  Psychiatric:        Mood and Affect: Mood is not anxious.        Behavior: Behavior is not agitated.     Assessment/Plan:  Active Problems:   Acute systolic heart failure (HCC) Acute-on-Chronic systolic heart failure - She is much improved after 3 doses of Lasix 40 mg. Though she  still has few diffuse rhonchi on physical exam, she is clinically stable and feels well enough to return to the assisted-living facility where she lives. - No changes to home medication regimen.  - We gave her 510mg  of Feraheme yesterday due to her having a ferritin <100 ng/mL in a patient with systolic heart failure per FAIR-HF Trial recommendations  - Re-iterated the importance of adhering to her home O2 use.     LOS: 0 days   Pearla Dubonnet, Medical Student 11/19/2019, 10:56 AM

## 2019-11-19 NOTE — Progress Notes (Signed)
8/30: CSW spoke with Pamala Hurry at [facility], confirmed that pt would be returning. Spoke with pt's daughter Mickel Baas who confirmed that she will be transporting her mother back to the facility. CSW faxed dc summary to [facility].

## 2019-11-19 NOTE — Progress Notes (Addendum)
  Date: 11/19/2019  Patient name: Shirley Bates  Medical record number: [id number]  Date of birth: [birth year]   I have seen and evaluated Shirley Bates and discussed their care with the Residency Team.  In brief, patient is a 86 year old female with a past medical history of chronic systolic heart failure, A. fib with RVR status post cardioversion 2 days prior to admission, hypertension and hyperlipidemia who presented to the ED with worsening shortness of breath from her assisted living facility.  Patient presented to ED with worsening shortness of breath from her nursing home as well as increased anxiety.  Of note, patient had a recent cardioversion for her A. fib 2 days prior to admission and was found to be bradycardic post procedure and had her beta-blocker DC'd at that time.  Patient's daughter noted worsening bilateral lower extremity edema since her procedure as well as increasing shortness of breath.  No chest pain, no palpitations, no lightheadedness, no syncope, no focal weakness, no tingling or numbness, no headache, no blurry vision, no fevers or chills.  Today, the patient states that she feels well and denies any new complaints.  She states that her shortness of breath has improved.  PMHx, Fam Hx, and/or Soc Hx : As per resident admit note  Vitals:   11/19/19 1315 11/19/19 1318  BP:  (!) 142/65  Pulse:  (!) 57  Resp:  16  Temp:  97.7 F (36.5 C)  SpO2: 96% 96%   General: Awake, alert, NAD CVs: Bradycardic, normal heart sounds Lungs: Bilateral coarse breath sounds noted on exam Abdomen: Soft, nontender, nondistended, normoactive bowel sounds Extremities: No edema noted, nontender to palpation Psych: Normal mood and affect HEENT: Normocephalic, atraumatic Skin: Warm and dry  Assessment and Plan: I have seen and evaluated the patient as outlined above. I agree with the formulated Assessment and Plan as detailed in the residents' note, with the following changes:   1.   Acute on chronic systolic heart failure exacerbation: -Patient presented to the ED with worsening shortness of breath and bilateral lower extremity edema secondary to an acute on chronic systolic heart failure exacerbation. -Patient received Lasix 40 mg IV x1 in the ED with improvement of her lower extremity edema -She was continued on Lasix 40 mg IV twice daily on admission.  I am not sure how accurate her I's and O's were recorded in the ED but she is net negative approximately 660 cc today. -On exam today she has no lower extremity edema and her shortness of breath is resolved. -We will transition to home dose of oral Lasix today. -Continue with losartan for her hypertension and chronic systolic heart failure -Patient was noted to have a ferritin of less than 100 in the setting of history of chronic systolic heart failure.  Will give 1 dose Feraheme today. -No further work-up at this time.  Patient should be stable for DC back to SNF today  2.  Hypertension: -Patient was noted to be hypertensive today.  Will continue with her home dose of losartan at this time.  Most recent blood pressure was 142/65  3.  A. fib status post cardioversion: -Patient remains in sinus bradycardia currently.  We will continue to hold off on restarting a beta-blocker -Continue with Eliquis anticoagulation as well as amiodarone -Patient to follow-up with cardiology as an outpatient  4.  Hyperlipidemia: -We will continue the patient on simvastatin 20 mg daily   Aldine Contes, MD 8/30/20212:24 PM

## 2019-11-19 NOTE — TOC Transition Note (Signed)
Transition of Care [facility]) - CM/SW Discharge Note   Patient Details  Name: Shirley Bates MRN: [id number] Date of Birth: 01/17/1926  [facility]) CM/SW Contact:  Loreta Ave, [place] Phone Number: 11/19/2019, 2:30 PM   Clinical Narrative:    Patient will DC to: [facility] Anticipated DC date: 11/19/19 Family notified: Mickel Baas Transport by: Mickel Baas   Per MD patient ready for DC to [facility]. RN to call report prior to discharge ([phone]). RN, patient, patient's family, and facility notified of DC. Discharge Summary sent to facility. DC packet on chart.   CSW will sign off for now as social work intervention is no longer needed. Please consult [place] again if new needs arise.     Final next level of care: Acute to Acute Transfer Barriers to Discharge: Barriers Resolved   Patient Goals and CMS Choice Patient states their goals for this hospitalization and ongoing recovery are:: Get better soon CMS Medicare.gov Compare Post Acute Care list provided to:: Patient Represenative (must comment) Mickel Baas, daughter) Choice offered to / list presented to : Adult Children  Discharge Placement              Patient chooses bed at:  [facility]) Patient to be transferred to facility by: Daughter Mickel Baas Name of family member notified: Mickel Baas Patient and family notified of of transfer: 11/19/19  Discharge Plan and Services                                     Social Determinants of Health (SDOH) Interventions     Readmission Risk Interventions No flowsheet data found.

## 2019-11-19 NOTE — Hospital Course (Addendum)
Acute on Chronic Heart Failure: Ms. Cude was brought via EMS to the [facility] with worsening dyspnea on 8/29. MI was ruled out by EDP via negative troponin and EKG. She was found to have diffuse interstitial edema on CXR and LE edema on physical exam. She received her first dose of Lasix in the ED and experienced improve dypnea and resolution of her LE edema. She was admitted overnight for monitoring of her O2 saturations and IV Lasix for further diuresis. She was able to maintain appropriate O2 saturations on 2L nasal cannula, which is her reported home O2 requirement. On 8/30 she reported resolution of her symptoms and felt ready to return home.   Dementia: When Ms. Struve presented to the ED, she was acutely anxious and agitated. She very quickly returned to her cognitive baseline once being placed in a room in the ED and remained in good spirits throughout her hospitalization.   History of Afib, s/p cardioversion: Ms. Newsham maintained sinus bradycardia throughout her hospitalization with no recurrence of Afib. She remained on her amiodarone and home dose of Eliquis 2.5 mg BID throughout her stay.

## 2019-11-19 NOTE — Discharge Summary (Signed)
Name: Shirley Bates MRN: [id number] DOB: June 27, 1925 86 y.o. PCP: Jeanette Caprice, PA-C  Date of Admission: 11/18/2019  1:49 AM Date of Discharge: 11/19/2019 Attending Physician: Aldine Contes, MD  Discharge Diagnosis: 1. Acute on chronic systolic heart failure exacerbation  Discharge Medications: Allergies as of 11/19/2019   No Known Allergies     Medication List    TAKE these medications   acetaminophen 500 MG tablet Commonly known as: TYLENOL Take 500 mg by mouth every 6 (six) hours as needed for mild pain.   alendronate 70 MG tablet Commonly known as: FOSAMAX Take 1 tablet (70 mg total) by mouth every 7 (seven) days. Take with a full glass of water on an empty stomach.   aluminum-magnesium hydroxide-simethicone 284-132-44 MG/5ML Susp Commonly known as: MAALOX Take 30 mLs by mouth every 6 (six) hours as needed (indigestion).   amiodarone 200 MG tablet Commonly known as: PACERONE Take 1 tablet (200 mg total) by mouth daily.   apixaban 2.5 MG Tabs tablet Commonly known as: ELIQUIS Take 1 tablet (2.5 mg total) by mouth 2 (two) times daily.   ferrous sulfate 325 (65 FE) MG tablet Take 1 tablet (325 mg total) by mouth daily with breakfast.   Fish Oil 1200 MG Caps Take 1,200 mg by mouth daily.   furosemide 20 MG tablet Commonly known as: Lasix Take 20 mg everyday except on Mon, Wed,and Fri. Take 40 mg ( 2 tablets). If weight is greater than 3 lbs or increase swelling. Take 40 mg for 3 days then return daily dosing. What changed:   how to take this  additional instructions   guaiFENesin-dextromethorphan 100-10 MG/5ML syrup Commonly known as: ROBITUSSIN DM Take 5 mLs by mouth every 4 (four) hours as needed for cough.   loperamide 2 MG capsule Commonly known as: IMODIUM Take 2 mg by mouth as needed for diarrhea or loose stools (total 8 doses in 24 HR).   losartan 25 MG tablet Commonly known as: COZAAR Take 1 tablet (25 mg total) by mouth daily.     magnesium hydroxide 400 MG/5ML suspension Commonly known as: MILK OF MAGNESIA Take 30 mLs by mouth at bedtime as needed for mild constipation.   metoprolol succinate 100 MG 24 hr tablet Commonly known as: TOPROL-XL Take 100 mg by mouth daily. Take with or immediately following a meal.   multivitamin-iron-minerals-folic acid chewable tablet Chew 1 tablet by mouth daily.   MULTIVITAMIN/FLUORIDE PO Take 1 tablet by mouth every morning. Chewable   neomycin-bacitracin-polymyxin ointment Commonly known as: NEOSPORIN Apply 1 application topically as needed for wound care.   NONFORMULARY OR COMPOUNDED ITEM Apply 1 application topically at bedtime. Antifungal solution: Terbinafine 3%, Fluconazole 2%, Tea Tree Oil 5%, Urea 10%, Ibuprofen 2% in DMSO suspension #70mL  Apply at bedtime to all toenails except right 1st toe   simvastatin 20 MG tablet Commonly known as: ZOCOR TAKE 1 TABLET BY MOUTH AT BEDTIME   tetrahydrozoline 0.05 % ophthalmic solution Place 1 drop into both eyes in the morning.       Disposition and follow-up:   Shirley Bates was discharged from [facility] in Stable condition.  At the hospital follow up visit please address:  1.  Heart failure exacerbation, doing well after diuresis. On home O2. No Afib during stay.  2.  Labs / imaging needed at time of follow-up: CBC, BMP, BNP  3.  Pending labs/ test needing follow-up: n/a  Follow-up Appointments:  Follow-up Information    Kurth-Bowen,  Cornelia, PA-C. Schedule an appointment as soon as possible for a visit in 2 week(s).   Specialty: Physician Assistant Contact information: [address] [place] [address] [phone]        Leonie Man, MD .   Specialty: Cardiology Contact information: [address] [place] [address] [place] Hospital Course by problem list: 1. Acute on Chronic Heart Failure: Shirley Bates was brought via EMS  to the [facility] with worsening dyspnea on 8/29. MI was ruled out by EDP via negative troponin and EKG. She was found to have diffuse interstitial edema on CXR and LE edema on physical exam. She received her first dose of Lasix in the ED and experienced improve dypnea and resolution of her LE edema. She was admitted overnight for monitoring of her O2 saturations and IV Lasix for further diuresis. She was able to maintain appropriate O2 saturations on 2L nasal cannula, which is her reported home O2 requirement. On 8/30 she reported resolution of her symptoms and felt ready to return home.   2. Dementia: When Shirley Bates presented to the ED, she was acutely anxious and agitated. She very quickly returned to her cognitive baseline once being placed in a room in the ED and remained in good spirits throughout her hospitalization.   3. History of Afib, s/p cardioversion: Shirley Bates maintained sinus bradycardia throughout her hospitalization with no recurrence of Afib. She remained on her amiodarone and home dose of Eliquis 2.5 mg BID throughout her stay.   Discharge Vitals:   BP (!) 187/74   Pulse (!) 51   Temp 98 F (36.7 C) (Oral)   Resp 18   Ht 5\' 2"  (1.575 m)   Wt 56.7 kg Comment: scale a  SpO2 96%   BMI 22.86 kg/m   Pertinent Labs, Studies, and Procedures:  CBC Latest Ref Rng & Units 11/19/2019 11/18/2019 11/08/2019  WBC 4.0 - 10.5 K/uL 8.8 8.6 7.4  Hemoglobin 12.0 - 15.0 g/dL 10.8(L) 10.8(L) 11.0(L)  Hematocrit 36 - 46 % 34.2(L) 33.6(L) 34.4  Platelets 150 - 400 K/uL 213 222 337   BMP Latest Ref Rng & Units 11/19/2019 11/18/2019 11/08/2019  Glucose 70 - 99 mg/dL 145(H) 136(H) 80  BUN 8 - 23 mg/dL 29(H) 36(H) 12  Creatinine 0.44 - 1.00 mg/dL 1.21(H) 1.18(H) 0.82  BUN/Creat Ratio 12 - 28 - - 15  Sodium 135 - 145 mmol/L 136 132(L) 137  Potassium 3.5 - 5.1 mmol/L 3.7 4.0 4.9  Chloride 98 - 111 mmol/L 99 100 98  CO2 22 - 32 mmol/L 29 23 28   Calcium 8.9 - 10.3 mg/dL 8.5(L) 8.7(L) 9.0   Troponin  (11/18/19): 29>25  Discharge Instructions:    Discharge Instructions     Shirley Bates, I am glad you are feeling better! You were admitted to the hospital because you had some shortness of breath and your oxygen levels were low. Once you arrived, we gave you some diuretics to help decrease the amount of fluid built up. Thankfully, you have improved after one night's stay and are stable for discharge. Please continue your home medications as prescribed. It appears as though you are doing a great job with your nutrition - keep it up!   -If you have increased shortness of breath, chest pain, fevers, or leg swelling, please tell your nurses at your facility so you can see a doctor.   -Please make sure to follow-up  with an appointment with your primary care doctor in approximately 2 weeks. In addition, please make sure to go to your cardiologist at your next scheduled appointment.  -It was a pleasure meeting you, Shirley Bates. I wish you the best and hope you stay happy and healthy!  Thank you, Sanjuan Dame, MD     Signed: Sanjuan Dame, MD 11/19/2019, 10:42 AM   Pager: [phone]

## 2019-11-19 NOTE — Discharge Instructions (Signed)
Shirley Bates, I am glad you are feeling better! You were admitted to the hospital because you had some shortness of breath and your oxygen levels were low. Once you arrived, we gave you some diuretics to help decrease the amount of fluid built up. Thankfully, you have improved after one night's stay and are stable for discharge. Please continue your home medications as prescribed. It appears as though you are doing a great job with your nutrition - keep it up!   -If you have increased shortness of breath, chest pain, fevers, or leg swelling, please tell your nurses at your facility so you can see a doctor.   -Please make sure to follow-up with an appointment with your primary care doctor in approximately 2 weeks. In addition, please make sure to go to your cardiologist at your next scheduled appointment.  -It was a pleasure meeting you, Shirley Bates. I wish you the best and hope you stay happy and healthy!  Thank you, Sanjuan Dame, MD

## 2019-11-20 NOTE — Progress Notes (Signed)
Cardiology Office Note:    Date:  12/03/2019   ID:  Shirley Bates, DOB [birth year], MRN [id number]  PCP:  Jeanette Caprice, PA-C  Cardiologist:  Glenetta Hew, MD  Electrophysiologist:  None   Referring MD: Susy Frizzle, MD   Chief Complaint: follow-up for CHF and atrial fibrillation  History of Present Illness:    Shirley Bates is a 86 y.o. female with a history of chronic combined CHF with EF of 40-45%, atrial fibrillation s/p recent DCCV x2 on Amiodarone and Eliquis, hypertension, hyperlipidemia, severe dementia, and recurrent UTI who is followed by Dr. Ellyn Hack and presents today for follow-up of atrial fibrillation.   Patient was first seen by Dr. Ellyn Hack during admission in 09/2019 where she was found to be in atrial fibrillation with RVR with new onset acute combined CHF. She was started on IV Cardizem and Eliquis and was diuresed with IV Lasix. Echo showed LVEF of 40-45% with global hypokinesis, moderate LVH, biatrial enlargement, mild MR, mild to moderate AI, and moderately elevated PASP. High-sensitivity troponin minimally elevated and flat in the 20's but this was felt to be demand ischemia. She underwent TEE/DCCV on 10/16/2019. Unfortunately, DCCV unsuccessful and patient converted back to atrial fibrillation. Therefore, she was started on Amiodarone. She was discharge on Amiodarone 200mg  twice daily for 7 days with plans to then transition to 200mg  daily as well as low dose Eliquis 2.5mg  twice daily given age and weight. She ended up being discharged on home O2.   She was seen by Dr. Ellyn Hack for follow-up on 11/08/2019 at which time she was noted to have significant lower extremity edema (right > left) as well as some exertional dyspnea and orthopnea. Lasix was increased to 40mg  on M/W/F and 20mg  on all other days. Toprol-XL was also increaesd to 100 mg daily. Repeat DCCV was scheduled as well as repeat Echo in 3 months. She underwent successful DCCV on 11/16/2019.   Patient  then readmitted on 11/18/2019 for acute on chronic combined CHF after presenting with worsening dyspnea. She was treated with IV Lasix with improvement in dyspnea and lower extremity edema. She was also treated with Feraheme given her Ferritin level was less than 100. She was discharged on 11/19/2019 on her home Lasix dose. Discharge weight 125 lbs.   Patient presents today for follow-up. Patient here today with her daughter. Unfortunately, she is back in atrial fibrillation with rates in the 120's. Her Toprol-XL was discontinued after last DCCV due to bradycardia while in sinus rhythm. Patient unaware of atrial fibrillation. No palpitations, lightheadedness, dizziness, or syncope. She has done well since leaving the hospital. Breathing improved and stable. She is now on 2L of O2 24/7. However, her lower extremity edema has started to worsening again. Staff at [facility] also reports her weights are trending up. Weight 128 lbs today. No chest pain. No other complaints today other than lower extremity edema.  Past Medical History:  Diagnosis Date  . Atrial fibrillation status post cardioversion ([place]) 10/12/2019   With recurrent A. fib post cardioversion (was placed on amiodarone and had TEE cardioversion)  . Cancer ([place])   . Hyperlipidemia   . Hypertension     Past Surgical History:  Procedure Laterality Date  . 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 CARDIOVERSION N/A 10/16/2019   Procedure: CARDIOVERSION;  Surgeon: Skeet Latch, MD;  Location: [place];  Service: Cardiovascular;;;  Post procedure patient had little bursts of A. fib.  Was given 150 amiodarone  bolus  . CARDIOVERSION N/A 11/16/2019   Procedure: CARDIOVERSION;  Surgeon: Acie Fredrickson Wonda Cheng, MD;  Location: [facility] ENDOSCOPY;  Service: Cardiovascular;  Laterality: N/A;  . TEE WITHOUT CARDIOVERSION N/A 10/16/2019   Procedure: TRANSESOPHAGEAL ECHOCARDIOGRAM (TEE);  Surgeon: Skeet Latch, MD;  Location: [place];  Service:  Cardiovascular;;  EF estimated 30 to 35% moderate decreased function.  Moderately reduced RV function.  No LAA thrombus.  Mild MR with multiple jets.  Mild to moderate TR.  Grade 2 aortic atheroma.   . TRANSTHORACIC ECHOCARDIOGRAM  10/12/2019   EF 40 and 45%.  Moderate biatrial enlargement.  Mild to moderate AI.    Current Medications: Current Meds  Medication Sig  . acetaminophen (TYLENOL) 500 MG tablet Take 500 mg by mouth every 6 (six) hours as needed for mild pain.  Marland Kitchen aluminum-magnesium hydroxide-simethicone (MAALOX) 361-443-15 MG/5ML SUSP Take 30 mLs by mouth every 6 (six) hours as needed (indigestion).  Marland Kitchen amiodarone (PACERONE) 200 MG tablet Take 1 tablet (200 mg total) by mouth daily.  Marland Kitchen apixaban (ELIQUIS) 2.5 MG TABS tablet Take 1 tablet (2.5 mg total) by mouth 2 (two) times daily.  . ferrous sulfate 325 (65 FE) MG tablet Take 1 tablet (325 mg total) by mouth daily with breakfast.  . furosemide (LASIX) 20 MG tablet Take 20 mg everyday except on Mon, Wed,and Fri. Take 40 mg ( 2 tablets). If weight is greater than 3 lbs or increase swelling. Take 40 mg for 3 days then return daily dosing. (Patient taking differently: Take by mouth. Take 20 mg by mouth on Tuesday, Thursday, Saturday and Sunday. If weight is greater than 3 lbs or increase swelling, take 40 mg for 3 days then return daily dosing.)  . guaiFENesin-dextromethorphan (ROBITUSSIN DM) 100-10 MG/5ML syrup Take 5 mLs by mouth every 4 (four) hours as needed for cough.  . loperamide (IMODIUM) 2 MG capsule Take 2 mg by mouth as needed for diarrhea or loose stools (total 8 doses in 24 HR).  Marland Kitchen losartan (COZAAR) 25 MG tablet Take 1 tablet (25 mg total) by mouth daily.  . magnesium hydroxide (MILK OF MAGNESIA) 400 MG/5ML suspension Take 30 mLs by mouth at bedtime as needed for mild constipation.  Marland Kitchen Multi Vit-Fluoride-Folic Acid (MULTIVITAMIN/FLUORIDE PO) Take 1 tablet by mouth every morning. Chewable  . multivitamin-iron-minerals-folic acid  (CENTRUM) chewable tablet Chew 1 tablet by mouth daily.  Marland Kitchen neomycin-bacitracin-polymyxin (NEOSPORIN) ointment Apply 1 application topically as needed for wound care.  . NONFORMULARY OR COMPOUNDED ITEM Apply 1 application topically at bedtime. Antifungal solution: Terbinafine 3%, Fluconazole 2%, Tea Tree Oil 5%, Urea 10%, Ibuprofen 2% in DMSO suspension #84mL  Apply at bedtime to all toenails except right 1st toe  . simvastatin (ZOCOR) 20 MG tablet TAKE 1 TABLET BY MOUTH AT BEDTIME  . tetrahydrozoline 0.05 % ophthalmic solution Place 1 drop into both eyes in the morning.      Allergies:   Patient has no known allergies.   Social History   Socioeconomic History  . Marital status: Divorced    Spouse name: Not on file  . Number of children: Not on file  . Years of education: Not on file  . Highest education level: Not on file  Occupational History  . Not on file  Tobacco Use  . Smoking status: Never Smoker  . Smokeless tobacco: Never Used  Substance and Sexual Activity  . Alcohol use: No  . Drug use: No  . Sexual activity: Never  Other Topics Concern  . Not  on file  Social History Narrative   She has been very independent was living on her own until January and February 2021.  She moved into the [facility].   CODE STATUS is DNR.   Social Determinants of Health   Financial Resource Strain:   . Difficulty of Paying Living Expenses: Not on file  Food Insecurity:   . Worried About Charity fundraiser in the Last Year: Not on file  . Ran Out of Food in the Last Year: Not on file  Transportation Needs:   . Lack of Transportation (Medical): Not on file  . Lack of Transportation (Non-Medical): Not on file  Physical Activity:   . Days of Exercise per Week: Not on file  . Minutes of Exercise per Session: Not on file  Stress:   . Feeling of Stress : Not on file  Social Connections:   . Frequency of Communication with Friends and Family: Not on file  . Frequency of Social  Gatherings with Friends and Family: Not on file  . Attends Religious Services: Not on file  . Active Member of Clubs or Organizations: Not on file  . Attends Archivist Meetings: Not on file  . Marital Status: Not on file     Family History: The patient's family history includes Heart disease in her brother, father, and sister; Hypertension in her brother. There is no history of Breast cancer.  ROS:   Please see the history of present illness.     EKGs/Labs/Other Studies Reviewed:    The following studies were reviewed today:  TTE 10/12/2019: Impressions: 1. Left ventricular ejection fraction, by estimation, is 40 to 45%. The  left ventricle has mildly decreased function. The left ventricle  demonstrates global hypokinesis. There is moderate concentric left  ventricular hypertrophy. Left ventricular  diastolic function could not be evaluated.  2. Right ventricular systolic function is normal. The right ventricular  size is normal. There is moderately elevated pulmonary artery systolic  pressure.  3. Left atrial size was moderately dilated.  4. Right atrial size was moderately dilated.  5. The mitral valve is normal in structure. Mild mitral valve  regurgitation. No evidence of mitral stenosis.  6. The aortic valve is grossly normal. Aortic valve regurgitation is mild  to moderate. Mild aortic valve sclerosis is present, with no evidence of  aortic valve stenosis.   Comparison(s): Prior images unable to be directly viewed, comparison made by report only.   Conclusion(s)/Recommendation(s): In atrial fibrillation with rapid  ventricular response (130-140 bpm) throughout study. _______________  TEE 10/16/2019: Impressions: 1. Left ventricular ejection fraction, by estimation, is 30 to 35%. The  left ventricle has moderately decreased function. The left ventricle  demonstrates global hypokinesis.  2. Right ventricular systolic function is moderately reduced. The  right  ventricular size is normal.  3. No left atrial/left atrial appendage thrombus was detected. The LAA  emptying velocity was 62 cm/s.  4. Multiple regurgitant jets. The mitral valve is normal in structure.  Mild mitral valve regurgitation. No evidence of mitral stenosis.  5. Tricuspid valve regurgitation is mild to moderate.  6. The aortic valve is tricuspid. Aortic valve regurgitation is mild to  moderate. No aortic stenosis is present.  7. There is mild (Grade II) layered plaque involving the transverse aorta  and descending aorta.  8. The inferior vena cava is normal in size with greater than 50%  respiratory variability, suggesting right atrial pressure of 3 mmHg.   Conclusion(s)/Recommendation(s): Normal biventricular  function without  evidence of hemodynamically significant valvular heart disease.   EKG:  EKG ordered today. EKG personally reviewed and demonstrates atrial fibrillation, rate 124 bpm, with LVH and non-specific ST/T changes.  Recent Labs: 10/12/2019: ALT 36 11/18/2019: B Natriuretic Peptide 1,923.7; TSH 1.398 11/19/2019: BUN 29; Creatinine, Ser 1.21; Hemoglobin 10.8; Magnesium 2.2; Platelets 213; Potassium 3.7; Sodium 136  Recent Lipid Panel    Component Value Date/Time   CHOL 118 10/13/2019 1156   TRIG 46 10/13/2019 1156   HDL 43 10/13/2019 1156   CHOLHDL 2.7 10/13/2019 1156   VLDL 9 10/13/2019 1156   LDLCALC 66 10/13/2019 1156    Physical Exam:    Vital Signs: BP 130/80   Pulse (!) 124   Ht 5\' 2"  (1.575 m)   Wt 128 lb (58.1 kg)   SpO2 98%   BMI 23.41 kg/m     Wt Readings from Last 3 Encounters:  12/03/19 128 lb (58.1 kg)  11/19/19 125 lb (56.7 kg)  11/08/19 124 lb (56.2 kg)     General: 86 y.o. female in no acute distress. Hard of hearing. HEENT: Normocephalic and atraumatic. Sclera clear.  Neck: Supple. No JVD. Heart: Tachycardic with irregularly irregular rhythm. No murmurs, gallops, or rubs. Radial pulses 2+ and equal  bilaterally. Lungs: On supplemental O2 via nasal cannula. No increased work of breathing. Clear to ausculation bilaterally. No wheezes, rhonchi, or rales.  Abdomen: Soft, non-distended, and non-tender to palpation.   Extremities: 1-2+ pitting edema of bilateral lower extremities.   Skin: Warm and dry. Neuro: No focal deficits. Psych: Normal affect. Responds appropriately.  Assessment:    1. Chronic combined systolic and diastolic CHF (congestive heart failure) (HCC)   2. Persistent atrial fibrillation ([place])   3. Essential hypertension   4. Hyperlipidemia, unspecified hyperlipidemia type   5. Medication management     Plan:    Chronic Combined CHF - Patient recently admitted with acute on chronic CHF. - Echo on 10/12/2019 showed LVEF of 40-45% with global hypokinesis and moderate LVH.  - 1-2+ lower extremity edema on exam but other appears euovlemic. - Will increase Lasix to 40mg  x5 days and can then return to usual regimen (40mg  on M/W/F and 20mg  on all other days as well as PRN 40mg  x3 days for weight gain or increased edema). Will prescribe K-Dur 20 mEq for patient to take on days that she takes 40mg  of Lasix. - Continue Losartan 25mg  daily. - Will restart Toprol-XL 100mg  daily. - Advised patient to let us know if she gains 3lb in 1 day or 5 lbs in 1 week. Recommend compression stocking/ACE wraps and low sodium diet. - Will recheck BMET today. - Already scheduled for have repeat Echo in 01/2020.  Persistent Atrial Fibrillation  - S/p DCCV on 11/16/2019.  - Unfortunately back in atrial fibrillation with rates in the 120's today. - Restart Toprol-XL 100mg  daily. - Continue Amiodarone 200mg  daily.  - Continue Eliquis 2.5mg  twice daily (low dose given age and weight). - Per Dr. Allison Quarry last note, plan will now be for rate control. - [place] to monitor BP and heart daily for the next week after restarting beta-blocker.  Hypertension - BP well controlled.  -  Continue Losartan 25mg  daily. - Restart Toprol-XL as above  Hyperlipidemia - Continue Simvastatin 20mg  daily.  Disposition: Follow up in 1 month with me.   Medication Adjustments/Labs and Tests Ordered: Current medicines are reviewed at length with the patient today.  Concerns regarding medicines are outlined  above.  Orders Placed This Encounter  Procedures  . Basic Metabolic Panel (BMET)  . EKG 12-Lead   Meds ordered this encounter  Medications  . metoprolol succinate (TOPROL-XL) 100 MG 24 hr tablet    Sig: Take 1 tablet (100 mg total) by mouth daily. Take with or immediately following a meal.    Dispense:  90 tablet    Refill:  3  . potassium chloride SA (KLOR-CON) 20 MEQ tablet    Sig: Take 1 tablet (20 mEq total) by mouth daily.    Dispense:  30 tablet    Refill:  1    Patient Instructions  Medication Instructions:  RESTART- Metoprolol Succinate 100 mg by mouth daily INCREASE- Furosemide(lasix) 40 mg by mouth daily for 5 days then back to regular dose START- Potassium 20 meq by mouth daily for 5 days with the increase of Furosemide(Lasix)  *If you need a refill on your cardiac medications before your next appointment, please call your pharmacy*   Lab Work: BMP today  If you have labs (blood work) drawn today and your tests are completely normal, you will receive your results only by: Marland Kitchen MyChart Message (if you have MyChart) OR . A paper copy in the mail If you have any lab test that is abnormal or we need to change your treatment, we will call you to review the results.   Testing/Procedures: None Ordered   Follow-Up: At [facility], you and your health needs are our priority.  As part of our continuing mission to provide you with exceptional heart care, we have created designated Provider Care Teams.  These Care Teams include your primary Cardiologist (physician) and Advanced Practice Providers (APPs -  Physician Assistants and Nurse Practitioners) who all  work together to provide you with the care you need, when you need it.  We recommend signing up for the patient portal called "MyChart".  Sign up information is provided on this After Visit Summary.  MyChart is used to connect with patients for Virtual Visits (Telemedicine).  Patients are able to view lab/test results, encounter notes, upcoming appointments, etc.  Non-urgent messages can be sent to your provider as well.   To learn more about what you can do with MyChart, go to [email or web address].    Your next appointment:   1 month(s)  The format for your next appointment:   In Person  Provider:   Sande Rives, PA-C    Other Instructions  Keep daily check of blood pressure and heart rate     Signed, Eppie Gibson  12/03/2019 10:13 AM    [place] [facility]

## 2019-12-03 ENCOUNTER — Ambulatory Visit (INDEPENDENT_AMBULATORY_CARE_PROVIDER_SITE_OTHER): Payer: Medicare Other | Admitting: Student

## 2019-12-03 ENCOUNTER — Other Ambulatory Visit: Payer: Self-pay

## 2019-12-03 ENCOUNTER — Other Ambulatory Visit: Payer: Self-pay | Admitting: Student

## 2019-12-03 ENCOUNTER — Encounter: Payer: Self-pay | Admitting: Student

## 2019-12-03 VITALS — BP 130/80 | HR 124 | Ht 62.0 in | Wt 128.0 lb

## 2019-12-03 DIAGNOSIS — I10 Essential (primary) hypertension: Secondary | ICD-10-CM | POA: Diagnosis not present

## 2019-12-03 DIAGNOSIS — I5042 Chronic combined systolic (congestive) and diastolic (congestive) heart failure: Secondary | ICD-10-CM | POA: Diagnosis not present

## 2019-12-03 DIAGNOSIS — I4819 Other persistent atrial fibrillation: Secondary | ICD-10-CM

## 2019-12-03 DIAGNOSIS — E785 Hyperlipidemia, unspecified: Secondary | ICD-10-CM

## 2019-12-03 DIAGNOSIS — Z79899 Other long term (current) drug therapy: Secondary | ICD-10-CM

## 2019-12-03 LAB — BASIC METABOLIC PANEL
BUN/Creatinine Ratio: 18 (ref 12–28)
BUN: 13 mg/dL (ref 10–36)
CO2: 26 mmol/L (ref 20–29)
Calcium: 8.8 mg/dL (ref 8.7–10.3)
Chloride: 97 mmol/L (ref 96–106)
Creatinine, Ser: 0.73 mg/dL (ref 0.57–1.00)
GFR calc Af Amer: 83 mL/min/{1.73_m2} (ref >59)
GFR calc non Af Amer: 72 mL/min/{1.73_m2} (ref >59)
Glucose: 93 mg/dL (ref 65–99)
Potassium: 4 mmol/L (ref 3.5–5.2)
Sodium: 135 mmol/L (ref 134–144)

## 2019-12-03 MED ORDER — POTASSIUM CHLORIDE CRYS ER 20 MEQ PO TBCR: 20.0000 meq | EXTENDED_RELEASE_TABLET | Freq: Every day | ORAL | 1 refills | Status: DC

## 2019-12-03 MED ORDER — METOPROLOL SUCCINATE ER 100 MG PO TB24: 100.0000 mg | ORAL_TABLET | Freq: Every day | ORAL | 3 refills | Status: AC

## 2019-12-03 NOTE — Patient Instructions (Signed)
Medication Instructions:  RESTART- Metoprolol Succinate 100 mg by mouth daily INCREASE- Furosemide(lasix) 40 mg by mouth daily for 5 days then back to regular dose START- Potassium 20 meq by mouth daily for 5 days with the increase of Furosemide(Lasix)  *If you need a refill on your cardiac medications before your next appointment, please call your pharmacy*   Lab Work: BMP today  If you have labs (blood work) drawn today and your tests are completely normal, you will receive your results only by: Marland Kitchen MyChart Message (if you have MyChart) OR . A paper copy in the mail If you have any lab test that is abnormal or we need to change your treatment, we will call you to review the results.   Testing/Procedures: None Ordered   Follow-Up: At [facility], you and your health needs are our priority.  As part of our continuing mission to provide you with exceptional heart care, we have created designated Provider Care Teams.  These Care Teams include your primary Cardiologist (physician) and Advanced Practice Providers (APPs -  Physician Assistants and Nurse Practitioners) who all work together to provide you with the care you need, when you need it.  We recommend signing up for the patient portal called "MyChart".  Sign up information is provided on this After Visit Summary.  MyChart is used to connect with patients for Virtual Visits (Telemedicine).  Patients are able to view lab/test results, encounter notes, upcoming appointments, etc.  Non-urgent messages can be sent to your provider as well.   To learn more about what you can do with MyChart, go to [email or web address].    Your next appointment:   1 month(s)  The format for your next appointment:   In Person  Provider:   Sande Rives, PA-C    Other Instructions  Keep daily check of blood pressure and heart rate

## 2019-12-18 ENCOUNTER — Other Ambulatory Visit: Payer: Self-pay | Admitting: Family Medicine

## 2019-12-24 ENCOUNTER — Encounter (HOSPITAL_COMMUNITY): Payer: Self-pay

## 2019-12-24 ENCOUNTER — Emergency Department (HOSPITAL_COMMUNITY): Payer: Medicare Other

## 2019-12-24 ENCOUNTER — Inpatient Hospital Stay (HOSPITAL_COMMUNITY)
Admission: EM | Admit: 2019-12-24 | Discharge: 2020-01-01 | DRG: 177 | Disposition: A | Discharge status: Skilled Nursing Facility | Payer: Medicare Other | Source: Skilled Nursing Facility | Attending: Internal Medicine | Admitting: Internal Medicine

## 2019-12-24 DIAGNOSIS — I420 Dilated cardiomyopathy: Secondary | ICD-10-CM | POA: Diagnosis present

## 2019-12-24 DIAGNOSIS — N179 Acute kidney failure, unspecified: Secondary | ICD-10-CM | POA: Diagnosis not present

## 2019-12-24 DIAGNOSIS — I110 Hypertensive heart disease with heart failure: Secondary | ICD-10-CM | POA: Diagnosis present

## 2019-12-24 DIAGNOSIS — I10 Essential (primary) hypertension: Secondary | ICD-10-CM | POA: Diagnosis not present

## 2019-12-24 DIAGNOSIS — E785 Hyperlipidemia, unspecified: Secondary | ICD-10-CM | POA: Diagnosis present

## 2019-12-24 DIAGNOSIS — J9621 Acute and chronic respiratory failure with hypoxia: Secondary | ICD-10-CM | POA: Diagnosis present

## 2019-12-24 DIAGNOSIS — Z7901 Long term (current) use of anticoagulants: Secondary | ICD-10-CM

## 2019-12-24 DIAGNOSIS — F05 Delirium due to known physiological condition: Secondary | ICD-10-CM | POA: Diagnosis present

## 2019-12-24 DIAGNOSIS — Z66 Do not resuscitate: Secondary | ICD-10-CM | POA: Diagnosis present

## 2019-12-24 DIAGNOSIS — I4819 Other persistent atrial fibrillation: Secondary | ICD-10-CM | POA: Diagnosis present

## 2019-12-24 DIAGNOSIS — T502X5A Adverse effect of carbonic-anhydrase inhibitors, benzothiadiazides and other diuretics, initial encounter: Secondary | ICD-10-CM | POA: Diagnosis not present

## 2019-12-24 DIAGNOSIS — I4891 Unspecified atrial fibrillation: Secondary | ICD-10-CM

## 2019-12-24 DIAGNOSIS — F0390 Unspecified dementia without behavioral disturbance: Secondary | ICD-10-CM | POA: Diagnosis present

## 2019-12-24 DIAGNOSIS — K7460 Unspecified cirrhosis of liver: Secondary | ICD-10-CM | POA: Diagnosis present

## 2019-12-24 DIAGNOSIS — R7401 Elevation of levels of liver transaminase levels: Secondary | ICD-10-CM | POA: Diagnosis not present

## 2019-12-24 DIAGNOSIS — J9600 Acute respiratory failure, unspecified whether with hypoxia or hypercapnia: Secondary | ICD-10-CM | POA: Diagnosis present

## 2019-12-24 DIAGNOSIS — E876 Hypokalemia: Secondary | ICD-10-CM | POA: Diagnosis not present

## 2019-12-24 DIAGNOSIS — I5043 Acute on chronic combined systolic (congestive) and diastolic (congestive) heart failure: Secondary | ICD-10-CM | POA: Diagnosis not present

## 2019-12-24 DIAGNOSIS — L8990 Pressure ulcer of unspecified site, unspecified stage: Secondary | ICD-10-CM | POA: Insufficient documentation

## 2019-12-24 DIAGNOSIS — J9601 Acute respiratory failure with hypoxia: Secondary | ICD-10-CM | POA: Diagnosis not present

## 2019-12-24 DIAGNOSIS — L89152 Pressure ulcer of sacral region, stage 2: Secondary | ICD-10-CM | POA: Diagnosis present

## 2019-12-24 DIAGNOSIS — U071 COVID-19: Secondary | ICD-10-CM | POA: Diagnosis present

## 2019-12-24 DIAGNOSIS — J1282 Pneumonia due to coronavirus disease 2019: Secondary | ICD-10-CM | POA: Diagnosis present

## 2019-12-24 DIAGNOSIS — D649 Anemia, unspecified: Secondary | ICD-10-CM | POA: Diagnosis present

## 2019-12-24 DIAGNOSIS — J069 Acute upper respiratory infection, unspecified: Secondary | ICD-10-CM | POA: Diagnosis present

## 2019-12-24 DIAGNOSIS — J90 Pleural effusion, not elsewhere classified: Secondary | ICD-10-CM

## 2019-12-24 DIAGNOSIS — E870 Hyperosmolality and hypernatremia: Secondary | ICD-10-CM | POA: Diagnosis not present

## 2019-12-24 DIAGNOSIS — J918 Pleural effusion in other conditions classified elsewhere: Secondary | ICD-10-CM | POA: Diagnosis present

## 2019-12-24 DIAGNOSIS — Z79899 Other long term (current) drug therapy: Secondary | ICD-10-CM

## 2019-12-24 LAB — CBC WITH DIFFERENTIAL/PLATELET
Abs Immature Granulocytes: 0.02 10*3/uL (ref 0.00–0.07)
Basophils Absolute: 0 10*3/uL (ref 0.0–0.1)
Basophils Relative: 0 %
Eosinophils Absolute: 0 10*3/uL (ref 0.0–0.5)
Eosinophils Relative: 0 %
HCT: 33.9 % — ABNORMAL LOW (ref 36.0–46.0)
Hemoglobin: 10.9 g/dL — ABNORMAL LOW (ref 12.0–15.0)
Immature Granulocytes: 1 %
Lymphocytes Relative: 9 %
Lymphs Abs: 0.4 10*3/uL — ABNORMAL LOW (ref 0.7–4.0)
MCH: 29.7 pg (ref 26.0–34.0)
MCHC: 32.2 g/dL (ref 30.0–36.0)
MCV: 92.4 fL (ref 80.0–100.0)
Monocytes Absolute: 0.3 10*3/uL (ref 0.1–1.0)
Monocytes Relative: 7 %
Neutro Abs: 3.3 10*3/uL (ref 1.7–7.7)
Neutrophils Relative %: 83 %
Platelets: 155 10*3/uL (ref 150–400)
RBC: 3.67 MIL/uL — ABNORMAL LOW (ref 3.87–5.11)
RDW: 18.6 % — ABNORMAL HIGH (ref 11.5–15.5)
WBC: 4 10*3/uL (ref 4.0–10.5)
nRBC: 0 % (ref 0.0–0.2)

## 2019-12-24 LAB — COMPREHENSIVE METABOLIC PANEL
ALT: 100 U/L — ABNORMAL HIGH (ref 0–44)
AST: 214 U/L — ABNORMAL HIGH (ref 15–41)
Albumin: 2 g/dL — ABNORMAL LOW (ref 3.5–5.0)
Alkaline Phosphatase: 166 U/L — ABNORMAL HIGH (ref 38–126)
Anion gap: 8 (ref 5–15)
BUN: 20 mg/dL (ref 8–23)
CO2: 23 mmol/L (ref 22–32)
Calcium: 7 mg/dL — ABNORMAL LOW (ref 8.9–10.3)
Chloride: 107 mmol/L (ref 98–111)
Creatinine, Ser: 0.78 mg/dL (ref 0.44–1.00)
GFR calc Af Amer: >60 mL/min (ref >60)
GFR calc non Af Amer: >60 mL/min (ref >60)
Glucose, Bld: 115 mg/dL — ABNORMAL HIGH (ref 70–99)
Potassium: 3.2 mmol/L — ABNORMAL LOW (ref 3.5–5.1)
Sodium: 138 mmol/L (ref 135–145)
Total Bilirubin: 0.8 mg/dL (ref 0.3–1.2)
Total Protein: 4.5 g/dL — ABNORMAL LOW (ref 6.5–8.1)

## 2019-12-24 LAB — RESPIRATORY PANEL BY RT PCR (FLU A&B, COVID)
Influenza A by PCR: NEGATIVE
Influenza B by PCR: NEGATIVE
SARS Coronavirus 2 by RT PCR: POSITIVE — AB

## 2019-12-24 LAB — PROCALCITONIN: Procalcitonin: 0.15 ng/mL

## 2019-12-24 LAB — D-DIMER, QUANTITATIVE: D-Dimer, Quant: 1.18 ug/mL-FEU — ABNORMAL HIGH (ref 0.00–0.50)

## 2019-12-24 LAB — TRIGLYCERIDES: Triglycerides: 40 mg/dL (ref <150)

## 2019-12-24 LAB — LACTATE DEHYDROGENASE: LDH: 367 U/L — ABNORMAL HIGH (ref 98–192)

## 2019-12-24 LAB — FIBRINOGEN: Fibrinogen: 380 mg/dL (ref 210–475)

## 2019-12-24 LAB — LACTIC ACID, PLASMA: Lactic Acid, Venous: 1 mmol/L (ref 0.5–1.9)

## 2019-12-24 LAB — C-REACTIVE PROTEIN: CRP: 6.1 mg/dL — ABNORMAL HIGH (ref <1.0)

## 2019-12-24 LAB — FERRITIN: Ferritin: 484 ng/mL — ABNORMAL HIGH (ref 11–307)

## 2019-12-24 MED ORDER — ACETAMINOPHEN 650 MG RE SUPP: 650.0000 mg | Freq: Four times a day (QID) | RECTAL | Status: DC | PRN

## 2019-12-24 MED ORDER — DEXAMETHASONE SODIUM PHOSPHATE 10 MG/ML IJ SOLN
6.0000 mg | Freq: Once | INTRAMUSCULAR | Status: AC
  Administered 2019-12-24: 6 mg via INTRAVENOUS
  Filled 2019-12-24: qty 1

## 2019-12-24 MED ORDER — ONDANSETRON HCL 4 MG PO TABS: 4.0000 mg | ORAL_TABLET | Freq: Four times a day (QID) | ORAL | Status: DC | PRN

## 2019-12-24 MED ORDER — SODIUM CHLORIDE 0.9 % IV SOLN
100.0000 mg | Freq: Every day | INTRAVENOUS | Status: AC
  Administered 2019-12-25 – 2019-12-28 (×4): 100 mg via INTRAVENOUS
  Filled 2019-12-24 (×6): qty 20

## 2019-12-24 MED ORDER — FUROSEMIDE 20 MG PO TABS
20.0000 mg | ORAL_TABLET | ORAL | Status: DC
  Administered 2019-12-26 – 2019-12-31 (×3): 20 mg via ORAL
  Filled 2019-12-24 (×7): qty 1

## 2019-12-24 MED ORDER — ONDANSETRON HCL 4 MG/2ML IJ SOLN: 4.0000 mg | Freq: Four times a day (QID) | INTRAMUSCULAR | Status: DC | PRN

## 2019-12-24 MED ORDER — SODIUM CHLORIDE 0.9 % IV SOLN
200.0000 mg | Freq: Once | INTRAVENOUS | Status: AC
  Administered 2019-12-25: 200 mg via INTRAVENOUS
  Filled 2019-12-24: qty 40

## 2019-12-24 MED ORDER — AMIODARONE HCL 200 MG PO TABS
200.0000 mg | ORAL_TABLET | Freq: Every day | ORAL | Status: DC
  Administered 2019-12-25 – 2020-01-01 (×8): 200 mg via ORAL
  Filled 2019-12-24 (×8): qty 1

## 2019-12-24 MED ORDER — HYDROCOD POLST-CPM POLST ER 10-8 MG/5ML PO SUER
5.0000 mL | Freq: Two times a day (BID) | ORAL | Status: DC | PRN
  Administered 2019-12-27: 5 mL via ORAL
  Filled 2019-12-24: qty 5

## 2019-12-24 MED ORDER — FERROUS SULFATE 325 (65 FE) MG PO TABS
325.0000 mg | ORAL_TABLET | Freq: Every day | ORAL | Status: DC
  Administered 2019-12-25 – 2020-01-01 (×8): 325 mg via ORAL
  Filled 2019-12-24 (×8): qty 1

## 2019-12-24 MED ORDER — METOPROLOL SUCCINATE ER 100 MG PO TB24
100.0000 mg | ORAL_TABLET | Freq: Every day | ORAL | Status: DC
  Administered 2019-12-25 – 2020-01-01 (×8): 100 mg via ORAL
  Filled 2019-12-24 (×8): qty 1

## 2019-12-24 MED ORDER — APIXABAN 2.5 MG PO TABS
2.5000 mg | ORAL_TABLET | Freq: Two times a day (BID) | ORAL | Status: DC
  Administered 2019-12-25 – 2019-12-26 (×5): 2.5 mg via ORAL
  Filled 2019-12-24 (×6): qty 1

## 2019-12-24 MED ORDER — POTASSIUM CHLORIDE CRYS ER 20 MEQ PO TBCR
20.0000 meq | EXTENDED_RELEASE_TABLET | Freq: Once | ORAL | Status: DC
  Filled 2019-12-24: qty 1

## 2019-12-24 MED ORDER — SODIUM CHLORIDE 0.9 % IV BOLUS
500.0000 mL | Freq: Once | INTRAVENOUS | Status: AC
  Administered 2019-12-24: 500 mL via INTRAVENOUS

## 2019-12-24 MED ORDER — PREDNISONE 20 MG PO TABS: 50.0000 mg | ORAL_TABLET | Freq: Every day | ORAL | Status: DC

## 2019-12-24 MED ORDER — LOSARTAN POTASSIUM 25 MG PO TABS
25.0000 mg | ORAL_TABLET | Freq: Every day | ORAL | Status: DC
  Administered 2019-12-25 – 2019-12-27 (×3): 25 mg via ORAL
  Filled 2019-12-24 (×3): qty 1

## 2019-12-24 MED ORDER — ACETAMINOPHEN 325 MG PO TABS
650.0000 mg | ORAL_TABLET | Freq: Four times a day (QID) | ORAL | Status: DC | PRN
  Administered 2020-01-01: 650 mg via ORAL
  Filled 2019-12-24: qty 2

## 2019-12-24 MED ORDER — GUAIFENESIN-DM 100-10 MG/5ML PO SYRP
10.0000 mL | ORAL_SOLUTION | ORAL | Status: DC | PRN
  Administered 2019-12-26 – 2020-01-01 (×3): 10 mL via ORAL
  Filled 2019-12-24 (×3): qty 10

## 2019-12-24 MED ORDER — SIMVASTATIN 20 MG PO TABS
20.0000 mg | ORAL_TABLET | Freq: Every day | ORAL | Status: DC
  Administered 2019-12-25 – 2020-01-01 (×8): 20 mg via ORAL
  Filled 2019-12-24 (×9): qty 1

## 2019-12-24 MED ORDER — METHYLPREDNISOLONE SODIUM SUCC 40 MG IJ SOLR
0.5000 mg/kg | Freq: Two times a day (BID) | INTRAMUSCULAR | Status: DC
  Administered 2019-12-24: 29.2 mg via INTRAVENOUS
  Filled 2019-12-24: qty 1

## 2019-12-24 NOTE — ED Notes (Signed)
Reposition pt in bed and check pt, Pt is clean and refused the purwick

## 2019-12-24 NOTE — H&P (Signed)
History and Physical    Shirley Bates QIH:[id number] DOB: May 18, 1925 DOA: 12/24/2019  PCP: Jeanette Caprice, PA-C  Patient coming from: Patient was transferred from dementia unit.  Chief Complaint: Fever.  History obtained from patient's daughter as patient has severe dementia.  HPI: Shirley Bates is a 86 y.o. female with history of cardiomyopathy, A. fib, hypertension, severe dementia was found to be Covid positive yesterday.  As per the patient's daughter one of the residents at the living facility was tested positive last week and subsequently all the residents were being checked alternatingly every other day for Covid test.  Patient turned out to be Covid positive yesterday but was asymptomatic.  But since this morning patient was getting more febrile and was brought to the ER.  ED Course: In the ER patient was hypoxic requiring 4 L oxygen with chest x-ray showing infiltrates Covid test was positive.  Labs are significant for elevated LFTs with AST of 214 ALT of 100 total bili 1.8 potassium 3.2 CRP 6.1 procalcitonin 0.15 hemoglobin 10.9.  D-dimer was 1.18.  Initially patient was in A. fib with RVR improved without any intervention.  Patient is started on steroids and remdesivir after discussing with patient's daughter and admitted for acute respiratory failure secondary to Covid infection.  Review of Systems: As per HPI, rest all negative.   Past Medical History:  Diagnosis Date  . Atrial fibrillation status post cardioversion ([place]) 10/12/2019   With recurrent A. fib post cardioversion (was placed on amiodarone and had TEE cardioversion)  . Cancer ([place])   . Hyperlipidemia   . Hypertension     Past Surgical History:  Procedure Laterality Date  . 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 CARDIOVERSION N/A 10/16/2019   Procedure: CARDIOVERSION;  Surgeon: Skeet Latch, MD;  Location: [place];  Service: Cardiovascular;;;  Post procedure patient had little bursts of A.  fib.  Was given 150 amiodarone bolus  . CARDIOVERSION N/A 11/16/2019   Procedure: CARDIOVERSION;  Surgeon: Acie Fredrickson Wonda Cheng, MD;  Location: [facility];  Service: Cardiovascular;  Laterality: N/A;  . TEE WITHOUT CARDIOVERSION N/A 10/16/2019   Procedure: TRANSESOPHAGEAL ECHOCARDIOGRAM (TEE);  Surgeon: Skeet Latch, MD;  Location: [place];  Service: Cardiovascular;;  EF estimated 30 to 35% moderate decreased function.  Moderately reduced RV function.  No LAA thrombus.  Mild MR with multiple jets.  Mild to moderate TR.  Grade 2 aortic atheroma.   . TRANSTHORACIC ECHOCARDIOGRAM  10/12/2019   EF 40 and 45%.  Moderate biatrial enlargement.  Mild to moderate AI.     reports that she has never smoked. She has never used smokeless tobacco. She reports that she does not drink alcohol and does not use drugs.  No Known Allergies  Family History  Problem Relation Age of Onset  . Heart disease Father   . Heart disease Sister   . Heart disease Brother   . Hypertension Brother   . Breast cancer Neg Hx     Prior to Admission medications   Medication Sig Start Date End Date Taking? Authorizing Provider  acetaminophen (TYLENOL) 500 MG tablet Take 500 mg by mouth every 6 (six) hours as needed for mild pain.   Yes [provider]  aluminum-magnesium hydroxide-simethicone (MAALOX) 200-200-20 MG/5ML SUSP Take 30 mLs by mouth every 6 (six) hours as needed (indigestion).   Yes [provider]  amiodarone (PACERONE) 200 MG tablet Take 1 tablet (200 mg total) by mouth daily. 11/15/19  Yes Ellyn Hack,  Leonie Green, MD  apixaban (ELIQUIS) 2.5 MG TABS tablet Take 1 tablet (2.5 mg total) by mouth 2 (two) times daily. 11/14/19  Yes Leonie Man, MD  ferrous sulfate 325 (65 FE) MG tablet Take 1 tablet (325 mg total) by mouth daily with breakfast. 11/14/19  Yes Leonie Man, MD  furosemide (LASIX) 20 MG tablet Take 20 mg everyday except on Mon, Wed,and Fri. Take 40 mg ( 2 tablets). If weight is  greater than 3 lbs or increase swelling. Take 40 mg for 3 days then return daily dosing. Patient taking differently: Take 20 mg by mouth. Take 20 mg by mouth on Tuesday, Thursday, Saturday and Sunday. If weight is greater than 3 lbs or increase swelling, take 40 mg for 3 days then return daily dosing. 11/08/19  Yes Leonie Man, MD  guaiFENesin-dextromethorphan [facility] DM) 100-10 MG/5ML syrup Take 5 mLs by mouth every 4 (four) hours as needed for cough. 10/17/19  Yes Aline August, MD  loperamide (IMODIUM) 2 MG capsule Take 2 mg by mouth as needed for diarrhea or loose stools (total 8 doses in 24 HR).   Yes [provider]  losartan (COZAAR) 25 MG tablet Take 1 tablet (25 mg total) by mouth daily. 11/14/19  Yes Leonie Man, MD  magnesium hydroxide (MILK OF MAGNESIA) 400 MG/5ML suspension Take 30 mLs by mouth at bedtime as needed for mild constipation.   Yes [provider]  metoprolol succinate (TOPROL-XL) 100 MG 24 hr tablet Take 1 tablet (100 mg total) by mouth daily. Take with or immediately following a meal. 12/03/19 03/02/20 Yes Sande Rives E, PA-C  Multi Vit-Fluoride-Folic Acid (MULTIVITAMIN/FLUORIDE PO) Take 1 tablet by mouth every morning. Chewable   Yes [provider]  multivitamin-iron-minerals-folic acid (CENTRUM) chewable tablet Chew 1 tablet by mouth daily.   Yes [provider]  naphazoline-pheniramine (NAPHCON-A) 0.025-0.3 % ophthalmic solution 1 drop daily.   Yes [provider]  neomycin-bacitracin-polymyxin (NEOSPORIN) ointment Apply 1 application topically as needed for wound Bates.   Yes [provider]  simvastatin (ZOCOR) 20 MG tablet TAKE 1 TABLET BY MOUTH AT BEDTIME Patient taking differently: Take 20 mg by mouth at bedtime.  12/19/19  Yes Susy Frizzle, MD  NONFORMULARY OR COMPOUNDED ITEM Apply 1 application topically at bedtime. Antifungal solution: Terbinafine 3%, Fluconazole 2%, Tea Tree Oil 5%, Urea 10%,  Ibuprofen 2% in DMSO suspension #51mL  Apply at bedtime to all toenails except right 1st toe Patient not taking: Reported on 12/24/2019 10/17/19   Aline August, MD  potassium chloride SA (KLOR-CON) 20 MEQ tablet TAKE 1 TABLET(20 MEQ) BY MOUTH DAILY Patient not taking: Reported on 12/24/2019 12/03/19   Darreld Mclean, PA-C    Physical Exam: Constitutional: Moderately built and nourished. Vitals:   12/24/19 1900 12/24/19 1915 12/24/19 2000 12/24/19 2030  BP: (!) 130/92 (!) 126/91 120/75 128/84  Pulse: 90 (!) 102 90 80  Resp: 17 (!) 29 (!) 41 18  Temp:      TempSrc:      SpO2: 100% 98% (!) 87% 99%   Eyes: Anicteric no pallor. ENMT: No discharge from the ears eyes nose or mouth. Neck: No mass felt.  No neck rigidity. Respiratory: No rhonchi or crepitations. Cardiovascular: S1-S2 heard. Abdomen: Soft nontender bowel sounds present. Musculoskeletal: No edema. Skin: No rash. Neurologic: Alert awake but has severe dementia does not follow commands.  Moves all extremities. Psychiatric: Has severe dementia.   Labs on Admission: I have personally reviewed  following labs and imaging studies  CBC: Recent Labs  Lab 12/24/19 1737  WBC 4.0  NEUTROABS 3.3  HGB 10.9*  HCT 33.9*  MCV 92.4  PLT 694   Basic Metabolic Panel: Recent Labs  Lab 12/24/19 1737  NA 138  K 3.2*  CL 107  CO2 23  GLUCOSE 115*  BUN 20  CREATININE 0.78  CALCIUM 7.0*   GFR: CrCl cannot be calculated (Unknown ideal weight.). Liver Function Tests: Recent Labs  Lab 12/24/19 1737  AST 214*  ALT 100*  ALKPHOS 166*  BILITOT 0.8  PROT 4.5*  ALBUMIN 2.0*   No results for input(s): LIPASE, AMYLASE in the last 168 hours. No results for input(s): AMMONIA in the last 168 hours. Coagulation Profile: No results for input(s): INR, PROTIME in the last 168 hours. Cardiac Enzymes: No results for input(s): CKTOTAL, CKMB, CKMBINDEX, TROPONINI in the last 168 hours. BNP (last 3 results) No results for  input(s): PROBNP in the last 8760 hours. HbA1C: No results for input(s): HGBA1C in the last 72 hours. CBG: No results for input(s): GLUCAP in the last 168 hours. Lipid Profile: Recent Labs    12/24/19 1739  TRIG 40   Thyroid Function Tests: No results for input(s): TSH, T4TOTAL, FREET4, T3FREE, THYROIDAB in the last 72 hours. Anemia Panel: Recent Labs    12/24/19 1737  FERRITIN 484*   Urine analysis:    Component Value Date/Time   COLORURINE AMBER (A) 10/12/2019 1528   APPEARANCEUR CLOUDY (A) 10/12/2019 1528   LABSPEC 1.021 10/12/2019 1528   PHURINE 5.0 10/12/2019 1528   GLUCOSEU NEGATIVE 10/12/2019 1528   HGBUR NEGATIVE 10/12/2019 1528   BILIRUBINUR NEGATIVE 10/12/2019 1528   KETONESUR NEGATIVE 10/12/2019 1528   PROTEINUR 30 (A) 10/12/2019 1528   NITRITE NEGATIVE 10/12/2019 1528   LEUKOCYTESUR LARGE (A) 10/12/2019 1528   Sepsis Labs: @LABRCNTIP (procalcitonin:4,lacticidven:4) ) Recent Results (from the past 240 hour(s))  Respiratory Panel by RT PCR (Flu A&B, Covid) - Nasopharyngeal Swab     Status: Abnormal   Collection Time: 12/24/19  6:39 PM   Specimen: Nasopharyngeal Swab  Result Value Ref Range Status   SARS Coronavirus 2 by RT PCR POSITIVE (A) NEGATIVE Final    Comment: RESULT CALLED TO, READ BACK BY AND VERIFIED WITH: Trudi Ida RN 2030 12/24/19 A BROWNING (NOTE) SARS-CoV-2 target nucleic acids are DETECTED.  SARS-CoV-2 RNA is generally detectable in upper respiratory specimens  during the acute phase of infection. Positive results are indicative of the presence of the identified virus, but do not rule out bacterial infection or co-infection with other pathogens not detected by the test. Clinical correlation with patient history and other diagnostic information is necessary to determine patient infection status. The expected result is Negative.  Fact Sheet for Patients:  PinkCheek.be  Fact Sheet for Healthcare  Providers: GravelBags.it  This test is not yet approved or cleared by the [place] FDA and  has been authorized for detection and/or diagnosis of SARS-CoV-2 by FDA under an Emergency Use Authorization (EUA).  This EUA will remain in effect (meaning this test can be  used) for the duration of  the COVID-19 declaration under Section 564(b)(1) of the Act, 21 U.S.C. section 360bbb-3(b)(1), unless the authorization is terminated or revoked sooner.      Influenza A by PCR NEGATIVE NEGATIVE Final   Influenza B by PCR NEGATIVE NEGATIVE Final    Comment: (NOTE) The Xpert Xpress SARS-CoV-2/FLU/RSV assay is intended as an aid in  the diagnosis of influenza from  Nasopharyngeal swab specimens and  should not be used as a sole basis for treatment. Nasal washings and  aspirates are unacceptable for Xpert Xpress SARS-CoV-2/FLU/RSV  testing.  Fact Sheet for Patients: PinkCheek.be  Fact Sheet for Healthcare Providers: GravelBags.it  This test is not yet approved or cleared by the [place] FDA and  has been authorized for detection and/or diagnosis of SARS-CoV-2 by  FDA under an Emergency Use Authorization (EUA). This EUA will remain  in effect (meaning this test can be used) for the duration of the  Covid-19 declaration under Section 564(b)(1) of the Act, 21  U.S.C. section 360bbb-3(b)(1), unless the authorization is  terminated or revoked. Performed at [facility], [place] [address], [place], [place] [address]      Radiological Exams on Admission: DG Chest Port 1 View  Result Date: 12/24/2019 CLINICAL DATA:  COVID-19 positivity with cough and chest congestion EXAM: PORTABLE CHEST 1 VIEW COMPARISON:  11/18/2018 FINDINGS: Cardiac shadow is enlarged but stable. Aortic calcifications are again seen. Patchy airspace opacity is noted in the right apex new from the prior exam consistent with the  given clinical history. Patchy bibasilar opacities are again seen. No acute bony abnormality is noted. IMPRESSION: New right upper lobe airspace opacity consistent with the given clinical history. Electronically Signed   By: Inez Catalina M.D.   On: 12/24/2019 18:31    EKG: Independently reviewed.  A. fib with RVR.  Assessment/Plan Principal Problem:   Acute respiratory failure (HCC) Active Problems:   Essential hypertension   Anemia   Dilated cardiomyopathy (HCC)   Persistent atrial fibrillation (HCC)   Acute respiratory disease due to COVID-19 virus    1. Acute respiratory failure with hypoxia secondary to COVID-19 infection presently on 4 L oxygen for which patient has been started on IV Solu-Medrol and remdesivir.  I also discussed with patient's daughter about the side effects indications and contraindications of Barcitinib.  Patient's daughter has consented to the use of baricitinib if required.  Follow respiratory status and inflammatory markers.  Since patient also has mildly elevated procalcitonin I have added doxycycline. 2. Markedly elevated LFTs with abdomen appears benign patient not having any vomiting will continue to monitor per LFTs likely elevated due to Covid.  If LFT elevation does not improve may have to hold amiodarone. 3. A. fib initially was in A. fib with RVR improved without any intervention.  Presently on beta-blockers and amiodarone.  Note that patient's LFTs are elevated and if continues to rise may have to hold amiodarone.  On apixaban for anticoagulation. 4. History of chronic systolic and diastolic CHF appears compensated last EF was 40 to 45%.  On Lasix every other day.  Also on ARB. 5. Anemia appears to be chronic follow CBC. 6. Severe dementia.  No acute issues.  Since patient has acute respiratory failure secondary to Covid infection will need close monitoring for any further deterioration in inpatient status.   DVT prophylaxis: Apixaban. Code Status: DNR  confirmed with patient's daughter. Family Communication: Patient's daughter. Disposition Plan: Back to facility when stable. Consults called: None. Admission status: Inpatient.   Rise Patience MD [facility] Pager [phone].  If 7PM-7AM, please contact night-coverage [email or web address] Password [facility]  12/24/2019, 9:31 PM

## 2019-12-24 NOTE — ED Provider Notes (Signed)
[facility] Emergency Department Provider Note MRN:  [id number]  Arrival date & time: 12/24/19     Chief Complaint   Covid Positive, Cough, and Shortness of Breath   History of Present Illness   Shirley Bates is a 86 y.o. year-old female with a history of hypertension, dementia, A. fib presenting to the ED with chief complaint of hypoxia.  Patient tested positive for COVID-19 yesterday.  Lives at memory unit and exposed to another resident with Covid.  Some increased cough and fevers for the past 1 to 2 days.  Oxygen saturation 83% at facility despite 2 L nasal cannula which is her baseline.  I was unable to obtain an accurate HPI, PMH, or ROS due to the patient's dementia.  Level 5 caveat.  Review of Systems  Positive for cough, hypoxia, fever.  Patient's Health History    Past Medical History:  Diagnosis Date  . Atrial fibrillation status post cardioversion ([place]) 10/12/2019   With recurrent A. fib post cardioversion (was placed on amiodarone and had TEE cardioversion)  . Cancer ([place])   . Hyperlipidemia   . Hypertension     Past Surgical History:  Procedure Laterality Date  . 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 CARDIOVERSION N/A 10/16/2019   Procedure: CARDIOVERSION;  Surgeon: Skeet Latch, MD;  Location: [place];  Service: Cardiovascular;;;  Post procedure patient had little bursts of A. fib.  Was given 150 amiodarone bolus  . CARDIOVERSION N/A 11/16/2019   Procedure: CARDIOVERSION;  Surgeon: Acie Fredrickson Wonda Cheng, MD;  Location: [place];  Service: Cardiovascular;  Laterality: N/A;  . TEE WITHOUT CARDIOVERSION N/A 10/16/2019   Procedure: TRANSESOPHAGEAL ECHOCARDIOGRAM (TEE);  Surgeon: Skeet Latch, MD;  Location: [place];  Service: Cardiovascular;;  EF estimated 30 to 35% moderate decreased function.  Moderately reduced RV function.  No LAA thrombus.  Mild MR with multiple jets.  Mild to moderate TR.  Grade 2 aortic atheroma.     . TRANSTHORACIC ECHOCARDIOGRAM  10/12/2019   EF 40 and 45%.  Moderate biatrial enlargement.  Mild to moderate AI.    Family History  Problem Relation Age of Onset  . Heart disease Father   . Heart disease Sister   . Heart disease Brother   . Hypertension Brother   . Breast cancer Neg Hx     Social History   Socioeconomic History  . Marital status: Divorced    Spouse name: Not on file  . Number of children: Not on file  . Years of education: Not on file  . Highest education level: Not on file  Occupational History  . Not on file  Tobacco Use  . Smoking status: Never Smoker  . Smokeless tobacco: Never Used  Substance and Sexual Activity  . Alcohol use: No  . Drug use: No  . Sexual activity: Never  Other Topics Concern  . Not on file  Social History Narrative   She has been very independent was living on her own until January and February 2021.  She moved into the [facility].   CODE STATUS is DNR.   Social Determinants of Health   Financial Resource Strain:   . Difficulty of Paying Living Expenses: Not on file  Food Insecurity:   . Worried About Charity fundraiser in the Last Year: Not on file  . Ran Out of Food in the Last Year: Not on file  Transportation Needs:   . Lack of Transportation (Medical): Not on  file  . Lack of Transportation (Non-Medical): Not on file  Physical Activity:   . Days of Exercise per Week: Not on file  . Minutes of Exercise per Session: Not on file  Stress:   . Feeling of Stress : Not on file  Social Connections:   . Frequency of Communication with Friends and Family: Not on file  . Frequency of Social Gatherings with Friends and Family: Not on file  . Attends Religious Services: Not on file  . Active Member of Clubs or Organizations: Not on file  . Attends Archivist Meetings: Not on file  . Marital Status: Not on file  Intimate Partner Violence:   . Fear of Current or Ex-Partner: Not on file  . Emotionally Abused: Not  on file  . Physically Abused: Not on file  . Sexually Abused: Not on file     Physical Exam   Vitals:   12/24/19 1728 12/24/19 1732  BP: 108/89   Pulse: (!) 120   Resp: (!) 21   Temp: 99.1 F (37.3 C)   SpO2: 92% 94%    CONSTITUTIONAL: Chronically ill-appearing, NAD NEURO: Oriented to name, moves all extremities EYES:  eyes equal and reactive ENT/NECK:  no LAD, no JVD CARDIO: Tachycardic and irregular rate, well-perfused, normal S1 and S2 PULM: Sitting comfortably with no increased work of breathing GI/GU:  normal bowel sounds, non-distended, non-tender MSK/SPINE:  No gross deformities, no edema SKIN:  no rash, atraumatic PSYCH:  Appropriate speech and behavior  *Additional and/or pertinent findings included in MDM below  Diagnostic and Interventional Summary    EKG Interpretation  Date/Time:  Monday December 24 2019 17:27:21 EDT Ventricular Rate:  135 PR Interval:    QRS Duration: 81 QT Interval:  348 QTC Calculation: 530 R Axis:   40 Text Interpretation: Atrial fibrillation Paired ventricular premature complexes Aberrant conduction of SV complex(es) Low voltage, precordial leads Nonspecific T abnrm, anterolateral leads Prolonged QT interval Confirmed by Gerlene Fee [phone]) on 12/24/2019 6:47:39 PM      Labs Reviewed  CBC WITH DIFFERENTIAL/PLATELET - Abnormal; Notable for the following components:      Result Value   RBC 3.67 (*)    Hemoglobin 10.9 (*)    HCT 33.9 (*)    RDW 18.6 (*)    Lymphs Abs 0.4 (*)    All other components within normal limits  COMPREHENSIVE METABOLIC PANEL - Abnormal; Notable for the following components:   Potassium 3.2 (*)    Glucose, Bld 115 (*)    Calcium 7.0 (*)    Total Protein 4.5 (*)    Albumin 2.0 (*)    AST 214 (*)    ALT 100 (*)    Alkaline Phosphatase 166 (*)    All other components within normal limits  D-DIMER, QUANTITATIVE (NOT AT [facility]) - Abnormal; Notable for the following components:   D-Dimer, Quant 1.18 (*)      All other components within normal limits  LACTATE DEHYDROGENASE - Abnormal; Notable for the following components:   LDH 367 (*)    All other components within normal limits  RESPIRATORY PANEL BY RT PCR (FLU A&B, COVID)  CULTURE, BLOOD (ROUTINE X 2)  CULTURE, BLOOD (ROUTINE X 2)  LACTIC ACID, PLASMA  TRIGLYCERIDES  FIBRINOGEN  LACTIC ACID, PLASMA  PROCALCITONIN  FERRITIN  C-REACTIVE PROTEIN    DG Chest Port 1 View  Final Result      Medications  sodium chloride 0.9 % bolus 500 mL (500 mLs Intravenous  New Bag/Given 12/24/19 1801)  dexamethasone (DECADRON) injection 6 mg (6 mg Intravenous Given 12/24/19 1802)     Procedures  /  Critical Care .Critical Care Performed by: Maudie Flakes, MD Authorized by: Maudie Flakes, MD   Critical care provider statement:    Critical care time (minutes):  33   Critical care was necessary to treat or prevent imminent or life-threatening deterioration of the following conditions: Hypoxic respiratory failure in the setting of COVID-19.   Critical care was time spent personally by me on the following activities:  Discussions with consultants, evaluation of patient's response to treatment, examination of patient, ordering and performing treatments and interventions, ordering and review of laboratory studies, ordering and review of radiographic studies, pulse oximetry, re-evaluation of patient's condition, obtaining history from patient or surrogate and review of old charts    ED Course and Medical Decision Making  I have reviewed the triage vital signs, the nursing notes, and pertinent available records from the EMR.  Listed above are laboratory and imaging tests that I personally ordered, reviewed, and interpreted and then considered in my medical decision making (see below for details).  COVID-19 with increased oxygen requirement, will need admission.  Patient is mildly tachycardic with known A. fib, mild RVR.  Blood pressure is a bit soft  compared to her baseline, will provide judicious fluids.  Currently on 4 L nasal cannula and her baseline is 2 L.  Discussed with patient's daughter, have confirmed that patient is DNR and is likely DNI as well though daughter would like to rediscuss that if it became a serious consideration.       Barth Kirks. Sedonia Small, MD [place] [email or web address]  Final Clinical Impressions(s) / ED Diagnoses     ICD-10-CM   1. COVID-19  U07.1   2. Acute respiratory failure with hypoxia (HCC)  J96.01   3. Atrial fibrillation with RVR ([place])  I48.91     ED Discharge Orders    None       Discharge Instructions Discussed with and Provided to Patient:   Discharge Instructions   None       Maudie Flakes, MD 12/24/19 1850

## 2019-12-24 NOTE — ED Triage Notes (Signed)
Per ems pt tested positive for covid yesterday. Patient stays at a memory care unit at [facility]. 84% RA and  98% on 4L. Pt is normally on 2l [place]. Per ems pt has rhonchi in her lung fields and on their monitor is in Afib. Pt has dementia, and is at her baseline per facility. Pt does have a productive cough per assessment.

## 2019-12-25 ENCOUNTER — Other Ambulatory Visit: Payer: Self-pay

## 2019-12-25 DIAGNOSIS — L8990 Pressure ulcer of unspecified site, unspecified stage: Secondary | ICD-10-CM | POA: Insufficient documentation

## 2019-12-25 LAB — BRAIN NATRIURETIC PEPTIDE: B Natriuretic Peptide: 843.9 pg/mL — ABNORMAL HIGH (ref 0.0–100.0)

## 2019-12-25 LAB — CBC WITH DIFFERENTIAL/PLATELET
Abs Immature Granulocytes: 0 10*3/uL (ref 0.00–0.07)
Basophils Absolute: 0 10*3/uL (ref 0.0–0.1)
Basophils Relative: 0 %
Eosinophils Absolute: 0 10*3/uL (ref 0.0–0.5)
Eosinophils Relative: 0 %
HCT: 35.2 % — ABNORMAL LOW (ref 36.0–46.0)
Hemoglobin: 11 g/dL — ABNORMAL LOW (ref 12.0–15.0)
Lymphocytes Relative: 3 %
Lymphs Abs: 0.1 10*3/uL — ABNORMAL LOW (ref 0.7–4.0)
MCH: 29.3 pg (ref 26.0–34.0)
MCHC: 31.3 g/dL (ref 30.0–36.0)
MCV: 93.6 fL (ref 80.0–100.0)
Monocytes Absolute: 0.1 10*3/uL (ref 0.1–1.0)
Monocytes Relative: 2 %
Neutro Abs: 2.9 10*3/uL (ref 1.7–7.7)
Neutrophils Relative %: 95 %
Platelets: 179 10*3/uL (ref 150–400)
RBC: 3.76 MIL/uL — ABNORMAL LOW (ref 3.87–5.11)
RDW: 18.8 % — ABNORMAL HIGH (ref 11.5–15.5)
WBC: 3.1 10*3/uL — ABNORMAL LOW (ref 4.0–10.5)
nRBC: 0 % (ref 0.0–0.2)
nRBC: 0 /100 WBC

## 2019-12-25 LAB — D-DIMER, QUANTITATIVE: D-Dimer, Quant: 1.25 ug/mL-FEU — ABNORMAL HIGH (ref 0.00–0.50)

## 2019-12-25 LAB — COMPREHENSIVE METABOLIC PANEL
ALT: 137 U/L — ABNORMAL HIGH (ref 0–44)
AST: 248 U/L — ABNORMAL HIGH (ref 15–41)
Albumin: 2.5 g/dL — ABNORMAL LOW (ref 3.5–5.0)
Alkaline Phosphatase: 186 U/L — ABNORMAL HIGH (ref 38–126)
Anion gap: 9 (ref 5–15)
BUN: 26 mg/dL — ABNORMAL HIGH (ref 8–23)
CO2: 29 mmol/L (ref 22–32)
Calcium: 8.1 mg/dL — ABNORMAL LOW (ref 8.9–10.3)
Chloride: 101 mmol/L (ref 98–111)
Creatinine, Ser: 0.95 mg/dL (ref 0.44–1.00)
GFR calc Af Amer: >60 mL/min (ref >60)
GFR calc non Af Amer: 52 mL/min — ABNORMAL LOW (ref >60)
Glucose, Bld: 146 mg/dL — ABNORMAL HIGH (ref 70–99)
Potassium: 4.6 mmol/L (ref 3.5–5.1)
Sodium: 139 mmol/L (ref 135–145)
Total Bilirubin: 1 mg/dL (ref 0.3–1.2)
Total Protein: 5.8 g/dL — ABNORMAL LOW (ref 6.5–8.1)

## 2019-12-25 LAB — HEPATITIS PANEL, ACUTE
HCV Ab: NONREACTIVE
Hep A IgM: NONREACTIVE
Hep B C IgM: NONREACTIVE
Hepatitis B Surface Ag: NONREACTIVE

## 2019-12-25 LAB — MAGNESIUM: Magnesium: 2.1 mg/dL (ref 1.7–2.4)

## 2019-12-25 LAB — ACETAMINOPHEN LEVEL: Acetaminophen (Tylenol), Serum: <10 ug/mL — ABNORMAL LOW (ref 10–30)

## 2019-12-25 LAB — C-REACTIVE PROTEIN: CRP: 8.1 mg/dL — ABNORMAL HIGH (ref <1.0)

## 2019-12-25 LAB — MRSA PCR SCREENING: MRSA by PCR: POSITIVE — AB

## 2019-12-25 MED ORDER — FUROSEMIDE 10 MG/ML IJ SOLN
40.0000 mg | Freq: Once | INTRAMUSCULAR | Status: AC
  Administered 2019-12-25: 40 mg via INTRAVENOUS
  Filled 2019-12-25: qty 4

## 2019-12-25 MED ORDER — SODIUM CHLORIDE 0.9 % IV SOLN
3.0000 g | Freq: Two times a day (BID) | INTRAVENOUS | Status: AC
  Administered 2019-12-25 – 2019-12-29 (×10): 3 g via INTRAVENOUS
  Filled 2019-12-25 (×2): qty 8
  Filled 2019-12-25: qty 3
  Filled 2019-12-25: qty 8
  Filled 2019-12-25 (×4): qty 3
  Filled 2019-12-25 (×2): qty 8
  Filled 2019-12-25: qty 3

## 2019-12-25 MED ORDER — PANTOPRAZOLE SODIUM 40 MG PO TBEC
40.0000 mg | DELAYED_RELEASE_TABLET | Freq: Every day | ORAL | Status: DC
  Administered 2019-12-26 – 2020-01-01 (×7): 40 mg via ORAL
  Filled 2019-12-25 (×7): qty 1

## 2019-12-25 MED ORDER — HALOPERIDOL LACTATE 5 MG/ML IJ SOLN
2.0000 mg | Freq: Four times a day (QID) | INTRAMUSCULAR | Status: DC | PRN
  Administered 2019-12-25 – 2019-12-27 (×6): 2 mg via INTRAVENOUS
  Filled 2019-12-25 (×6): qty 1

## 2019-12-25 MED ORDER — SODIUM CHLORIDE 0.9 % IV SOLN
100.0000 mg | Freq: Two times a day (BID) | INTRAVENOUS | Status: DC
  Administered 2019-12-25: 100 mg via INTRAVENOUS
  Filled 2019-12-25 (×3): qty 100

## 2019-12-25 MED ORDER — METHYLPREDNISOLONE SODIUM SUCC 125 MG IJ SOLR
60.0000 mg | Freq: Two times a day (BID) | INTRAMUSCULAR | Status: DC
  Administered 2019-12-25 – 2019-12-30 (×12): 60 mg via INTRAVENOUS
  Filled 2019-12-25 (×12): qty 2

## 2019-12-25 NOTE — Progress Notes (Signed)
Pharmacy Antibiotic Note  Shirley Bates is a 86 y.o. female admitted on 12/24/2019 with aspiration pneumonia.  Pharmacy has been consulted for Unasyn dosing. SCr trend up to 0.95 today.  Plan: Unasyn 3g IV q12h Monitor clinical progress, c/s, renal function F/u de-escalation plan/LOT Pharmacy will sign off consult and monitor peripherally  Weight: 58.1 kg (128 lb 1.4 oz)  Temp (24hrs), Avg:98.2 F (36.8 C), Min:97.6 F (36.4 C), Max:99.1 F (37.3 C)  Recent Labs  Lab 12/24/19 1737 12/24/19 1739 12/25/19 0047  WBC 4.0  --  3.1*  CREATININE 0.78  --  0.95  LATICACIDVEN  --  1.0  --     Estimated Creatinine Clearance: 29.9 mL/min (by C-G formula based on SCr of 0.95 mg/dL).    No Known Allergies   Arturo Morton, PharmD, BCPS Please check AMION for all [place] contact numbers Clinical Pharmacist 12/25/2019 10:10 AM

## 2019-12-25 NOTE — Progress Notes (Signed)
PROGRESS NOTE                                                                                                                                                                                                             Patient Demographics:    Shirley Bates, is a 86 y.o. female, DOB - 1925-09-21, GLO:[id number]  Admit date - 12/24/2019   Admitting Physician Rise Patience, MD  Outpatient Primary MD for the patient is Kurth-Bowen, Cornelia, PA-C  LOS - 1  Chief Complaint  Patient presents with  . Covid Positive  . Cough  . Shortness of Breath       Brief Narrative  Shirley Bates is a 86 y.o. female with history of cardiomyopathy, A. fib, hypertension, severe dementia was found to be Covid positive yesterday.  As per the patient's daughter one of the residents at the living facility was tested positive last week and subsequently all the residents were being checked alternatingly every other day for Covid test.  Patient turned out to be Covid positive yesterday but was asymptomatic.  But since this morning patient was getting more febrile and was brought to the ER.   Subjective:    Shirley Bates today has, No headache, No chest pain, No abdominal pain - No Nausea, No new weakness tingling or numbness, No Cough - SOB.  Unreliable historian.   Assessment  & Plan :     1.  Acute respiratory failure with hypoxia secondary to COVID-19 infection presently on 4 L oxygen for which patient has been started on IV Solu-Medrol and remdesivir.  Patient's daughter has consented to the use of baricitinib if required.  Since the infiltrate is more prominent on the right side and procalcitonin was borderline we will put her on Unasyn and request speech to rule out any ongoing aspiration, currently appears nontoxic and does not clinically seems to have a flaring bacterial infection, due to her age she is tenuous, will encourage her to sit up in chair use I-S and flutter valve for pulmonary  toiletry, advance activity and titrate down oxygen as much as we can.    Recent Labs  Lab 12/24/19 1737 12/24/19 1739 12/24/19 1839 12/25/19 0047  WBC 4.0  --   --  3.1*  CRP 6.1*  --   --  8.1*  DDIMER 1.18*  --   --  1.25*  BNP  --   --   --  843.9*  PROCALCITON 0.15  --   --   --  LATICACIDVEN  --  1.0  --   --   AST 214*  --   --  248*  ALT 100*  --   --  137*  ALKPHOS 166*  --   --  186*  BILITOT 0.8  --   --  1.0  ALBUMIN 2.0*  --   --  2.5*  SARSCOV2NAA  --   --  POSITIVE*  --      2. Markedly elevated LFTs with abdomen appears benign patient not having any vomiting will continue to monitor per LFTs likely elevated due to Covid.  If LFT elevation does not improve may have to hold amiodarone.  Check right upper quadrant ultrasound. 3. A. fib initially was in A. fib with RVR improved without any intervention.  Presently on beta-blockers and amiodarone.  Note that patient's LFTs are elevated and if continues to rise may have to hold amiodarone.  On apixaban for anticoagulation.  She has had 2 cardioversions which have failed in the past, daughter now desires medical treatment only. 4. History of chronic systolic and diastolic CHF appears compensated last EF was 40 to 45%.  On Lasix every other day.  Also on ARB.  Currently evidence of fluid overload challenged with IV Lasix 1 dose on 12/25/2019. 5. Anemia appears to be chronic follow CBC.  6.  Severe dementia.  No acute issues.  Remains at risk for delirium.  Minimize narcotics and benzodiazepines.    Condition - Extremely Guarded  Family Communication  :  Daughter [phone] on 12/25/19, DNR, medical treatment.  If any decline focus more on comfort.  Code Status :  DNR  Consults  :  None  Procedures  :  None  PUD Prophylaxis : PPI  Disposition Plan  :    Status is: Inpatient  Remains inpatient appropriate because:IV treatments appropriate due to intensity of illness or inability to take PO   Dispo: The patient  is from: SNF              Anticipated d/c is to: Home              Anticipated d/c date is: > 3 days              Patient currently is not medically stable to d/c.    DVT Prophylaxis  :  Eliquis  Lab Results  Component Value Date   PLT 179 12/25/2019    Diet :  Diet Order            DIET SOFT Room service appropriate? Yes; Fluid consistency: Thin; Fluid restriction: 1200 mL Fluid  Diet effective now                  Inpatient Medications Scheduled Meds: . amiodarone  200 mg Oral Daily  . apixaban  2.5 mg Oral BID  . ferrous sulfate  325 mg Oral Q breakfast  . furosemide  20 mg Oral Q M,W,F  . losartan  25 mg Oral Daily  . methylPREDNISolone (SOLU-MEDROL) injection  60 mg Intravenous Q12H  . metoprolol succinate  100 mg Oral Daily  . potassium chloride  20 mEq Oral Once  . simvastatin  20 mg Oral QHS   Continuous Infusions: . doxycycline (VIBRAMYCIN) IV 100 mg (12/25/19 0703)  . remdesivir 100 mg in NS 100 mL 100 mg (12/25/19 0910)   PRN Meds:.acetaminophen **OR** [DISCONTINUED] acetaminophen, chlorpheniramine-HYDROcodone, guaiFENesin-dextromethorphan, [DISCONTINUED] ondansetron **OR** ondansetron (ZOFRAN) IV  Antibiotics  :   Anti-infectives (From admission,  onward)   Start     Dose/Rate Route Frequency Ordered Stop   12/25/19 1000  remdesivir 100 mg in sodium chloride 0.9 % 100 mL IVPB       "Followed by" Linked Group Details   100 mg 200 mL/hr over 30 Minutes Intravenous Daily 12/24/19 2133 12/29/19 0959   12/25/19 0600  doxycycline (VIBRAMYCIN) 100 mg in sodium chloride 0.9 % 250 mL IVPB        100 mg 125 mL/hr over 120 Minutes Intravenous Every 12 hours 12/25/19 0534     12/24/19 2200  remdesivir 200 mg in sodium chloride 0.9% 250 mL IVPB       "Followed by" Linked Group Details   200 mg 580 mL/hr over 30 Minutes Intravenous Once 12/24/19 2133 12/25/19 0903          Objective:   Vitals:   12/24/19 2315 12/24/19 2330 12/25/19 0012 12/25/19 0756  BP:  125/77 123/87 (!) 127/95 120/80  Pulse: 91 80 73 95  Resp: 15 14 20 16   Temp:   98 F (36.7 C) 97.6 F (36.4 C)  TempSrc:      SpO2: 98% 99% 99% 93%  Weight:        SpO2: 93 % O2 Flow Rate (L/min): 5 L/min  Wt Readings from Last 3 Encounters:  12/24/19 58.1 kg  12/03/19 58.1 kg  11/19/19 56.7 kg     Intake/Output Summary (Last 24 hours) at 12/25/2019 0948 Last data filed at 12/25/2019 0600 Gross per 24 hour  Intake 500 ml  Output 0 ml  Net 500 ml     Physical Exam  Awake, mildly confused, No new F.N deficits,  [place].AT,PERRAL Supple Neck,No JVD, No cervical lymphadenopathy appriciated.  Symmetrical Chest wall movement, Good air movement bilaterally, few rales RRR,No Gallops,Rubs or new Murmurs, No Parasternal Heave +ve B.Sounds, Abd Soft, No tenderness, No organomegaly appriciated, No rebound - guarding or rigidity. No Cyanosis, Clubbing , 2+ edema      Pressure Injury 12/25/19 Coccyx Medial Stage 2 -  Partial thickness loss of dermis presenting as a shallow open injury with a red, pink wound bed without slough. (Active)  12/25/19 0100  Location: Coccyx  Location Orientation: Medial  Staging: Stage 2 -  Partial thickness loss of dermis presenting as a shallow open injury with a red, pink wound bed without slough.  Wound Description (Comments):   Present on Admission: Yes     Data Review:   Recent Labs  Lab 12/24/19 1737 12/25/19 0047  WBC 4.0 3.1*  HGB 10.9* 11.0*  HCT 33.9* 35.2*  PLT 155 179  MCV 92.4 93.6  MCH 29.7 29.3  MCHC 32.2 31.3  RDW 18.6* 18.8*  LYMPHSABS 0.4* 0.1*  MONOABS 0.3 0.1  EOSABS 0.0 0.0  BASOSABS 0.0 0.0    Recent Labs  Lab 12/24/19 1737 12/24/19 1739 12/25/19 0047 12/25/19 0841  NA 138  --  139  --   K 3.2*  --  4.6  --   CL 107  --  101  --   CO2 23  --  29  --   GLUCOSE 115*  --  146*  --   BUN 20  --  26*  --   CREATININE 0.78  --  0.95  --   CALCIUM 7.0*  --  8.1*  --   AST 214*  --  248*  --   ALT 100*  --   137*  --   ALKPHOS 166*  --  186*  --   BILITOT 0.8  --  1.0  --   ALBUMIN 2.0*  --  2.5*  --   MG  --   --   --  2.1  CRP 6.1*  --  8.1*  --   DDIMER 1.18*  --  1.25*  --   PROCALCITON 0.15  --   --   --   LATICACIDVEN  --  1.0  --   --   BNP  --   --  843.9*  --     Recent Labs  Lab 12/24/19 1737 12/24/19 1839 12/25/19 0047  CRP 6.1*  --  8.1*  DDIMER 1.18*  --  1.25*  BNP  --   --  843.9*  PROCALCITON 0.15  --   --   SARSCOV2NAA  --  POSITIVE*  --     ------------------------------------------------------------------------------------------------------------------ Recent Labs    12/24/19 1739  TRIG 40    Lab Results  Component Value Date   HGBA1C 5.8 (H) 04/30/2014   ------------------------------------------------------------------------------------------------------------------ No results for input(s): TSH, T4TOTAL, T3FREE, THYROIDAB in the last 72 hours.  Invalid input(s): FREET3 ------------------------------------------------------------------------------------------------------------------ Recent Labs    12/24/19 1737  FERRITIN 484*    Coagulation profile No results for input(s): INR, PROTIME in the last 168 hours.  Recent Labs    12/24/19 1737 12/25/19 0047  DDIMER 1.18* 1.25*    Cardiac Enzymes No results for input(s): CKMB, TROPONINI, MYOGLOBIN in the last 168 hours.  Invalid input(s): CK ------------------------------------------------------------------------------------------------------------------    Component Value Date/Time   BNP 843.9 (H) 12/25/2019 0047    Micro Results Recent Results (from the past 240 hour(s))  Blood Culture (routine x 2)     Status: None (Preliminary result)   Collection Time: 12/24/19  5:37 PM   Specimen: BLOOD LEFT ARM  Result Value Ref Range Status   Specimen Description BLOOD LEFT ARM  Final   Special Requests   Final    BOTTLES DRAWN AEROBIC AND ANAEROBIC Blood Culture adequate volume   Culture    Final    NO GROWTH < 24 HOURS Performed at [facility], [place] [address], [place], [place] [address]    Report Status PENDING  Incomplete  Blood Culture (routine x 2)     Status: None (Preliminary result)   Collection Time: 12/24/19  5:51 PM   Specimen: BLOOD RIGHT HAND  Result Value Ref Range Status   Specimen Description BLOOD RIGHT HAND  Final   Special Requests   Final    BOTTLES DRAWN AEROBIC AND ANAEROBIC Blood Culture adequate volume   Culture   Final    NO GROWTH < 24 HOURS Performed at [facility], [place] [address] [place], [place] [address]    Report Status PENDING  Incomplete  Respiratory Panel by RT PCR (Flu A&B, Covid) - Nasopharyngeal Swab     Status: Abnormal   Collection Time: 12/24/19  6:39 PM   Specimen: Nasopharyngeal Swab  Result Value Ref Range Status   SARS Coronavirus 2 by RT PCR POSITIVE (A) NEGATIVE Final    Comment: RESULT CALLED TO, READ BACK BY AND VERIFIED WITH: Trudi Ida RN 2030 12/24/19 A BROWNING (NOTE) SARS-CoV-2 target nucleic acids are DETECTED.  SARS-CoV-2 RNA is generally detectable in upper respiratory specimens  during the acute phase of infection. Positive results are indicative of the presence of the identified virus, but do not rule out bacterial infection or co-infection with other pathogens not detected by the test. Clinical  correlation with patient history and other diagnostic information is necessary to determine patient infection status. The expected result is Negative.  Fact Sheet for Patients:  PinkCheek.be  Fact Sheet for Healthcare Providers: GravelBags.it  This test is not yet approved or cleared by the [place] FDA and  has been authorized for detection and/or diagnosis of SARS-CoV-2 by FDA under an Emergency Use Authorization (EUA).  This EUA will remain in effect (meaning this test can be  used) for the duration of  the COVID-19 declaration under  Section 564(b)(1) of the Act, 21 U.S.C. section 360bbb-3(b)(1), unless the authorization is terminated or revoked sooner.      Influenza A by PCR NEGATIVE NEGATIVE Final   Influenza B by PCR NEGATIVE NEGATIVE Final    Comment: (NOTE) The Xpert Xpress SARS-CoV-2/FLU/RSV assay is intended as an aid in  the diagnosis of influenza from Nasopharyngeal swab specimens and  should not be used as a sole basis for treatment. Nasal washings and  aspirates are unacceptable for Xpert Xpress SARS-CoV-2/FLU/RSV  testing.  Fact Sheet for Patients: PinkCheek.be  Fact Sheet for Healthcare Providers: GravelBags.it  This test is not yet approved or cleared by the [place] FDA and  has been authorized for detection and/or diagnosis of SARS-CoV-2 by  FDA under an Emergency Use Authorization (EUA). This EUA will remain  in effect (meaning this test can be used) for the duration of the  Covid-19 declaration under Section 564(b)(1) of the Act, 21  U.S.C. section 360bbb-3(b)(1), unless the authorization is  terminated or revoked. Performed at [facility], [place] [address], [place], [place] [address]     Radiology Reports DG Chest [place] 1 View  Result Date: 12/24/2019 CLINICAL DATA:  COVID-19 positivity with cough and chest congestion EXAM: PORTABLE CHEST 1 VIEW COMPARISON:  11/18/2018 FINDINGS: Cardiac shadow is enlarged but stable. Aortic calcifications are again seen. Patchy airspace opacity is noted in the right apex new from the prior exam consistent with the given clinical history. Patchy bibasilar opacities are again seen. No acute bony abnormality is noted. IMPRESSION: New right upper lobe airspace opacity consistent with the given clinical history. Electronically Signed   By: Inez Catalina M.D.   On: 12/24/2019 18:31    Time Spent in minutes  30   Lala Lund M.D on 12/25/2019 at 9:48 AM  To page go to [email or web address] -  password [facility]

## 2019-12-25 NOTE — Plan of Care (Signed)

## 2019-12-25 NOTE — Evaluation (Signed)
Physical Therapy Evaluation Patient Details Name: Shirley Bates MRN: [id number] DOB: December 13, 1925 Today's Date: 12/25/2019   History of Present Illness  Shirley Bates is a 86 y.o. female with history of cardiomyopathy, A. fib, hypertension, severe dementia was found to be Covid positive yesterday.  LIves in [place] where resident was found positive so was getting testing.  Admitted due to fever and hypoxia requiring 4L O2 and CDR showing infiltrates.  Clinical Impression  Patient presents with decreased mobility due to generalized weakness, decreased cardiopulmonary endurance, with HR up to 140's and SpO2 down to 86% with ambulation in room on 2L O2., decreased safety awareness, decreased balance and she will benefit from skilled PT in the acute setting to allow return home to ALF at d/c with follow up HHPT.     Follow Up Recommendations Supervision for mobility/OOB;Home health PT    Equipment Recommendations  None recommended by PT    Recommendations for Other Services       Precautions / Restrictions Precautions Precautions: Fall Precaution Comments: watch HR, O2      Mobility  Bed Mobility Overal bed mobility: Needs Assistance Bed Mobility: Supine to Sit;Sit to Supine     Supine to sit: Supervision;HOB elevated Sit to supine: Mod assist   General bed mobility comments: able to come up to sit on her own, assist for legs into bed to supine, cues for positioning, pt able to scoot up in bed with min A  Transfers Overall transfer level: Needs assistance Equipment used: Rolling walker (2 wheeled) Transfers: Sit to/from Stand Sit to Stand: Min guard         General transfer comment: up and down on bed with steadying assist pt grunting down and up from toilet with min A needed for safety to rise  Ambulation/Gait Ambulation/Gait assistance: Min assist Gait Distance (Feet): 12 Feet (x 2) Assistive device: Rolling walker (2 wheeled) Gait Pattern/deviations: Step-through  pattern;Step-to pattern;Decreased stride length;Trunk flexed     General Gait Details: slow pace and flexed to and from bathroom with RW keeping walker a little further out, but uses rollator at baseline  Stairs            Wheelchair Mobility    Modified Rankin (Stroke Patients Only)       Balance Overall balance assessment: Needs assistance   Sitting balance-Leahy Scale: Good Sitting balance - Comments: on toilet without armrests, able to attempt hygiene in sitting   Standing balance support: During functional activity;No upper extremity supported Standing balance-Leahy Scale: Poor Standing balance comment: min A for balance while standing to try to perfom hygiene                             Pertinent Vitals/Pain Pain Assessment: No/denies pain    Home Living Family/patient expects to be discharged to:: Assisted living               Home Equipment: Shower seat - built in;Grab bars - toilet;Grab bars - tub/shower;Hand held Tourist information centre manager - 4 wheels Additional Comments: [facility]    Prior Function Level of Independence: Needs assistance      ADL's / Homemaking Assistance Needed: ALF assists with bathing, dresses herself, toilets herself  Comments: uses rollator, was using cane till started using oxygen     Hand Dominance        Extremity/Trunk Assessment   Upper Extremity Assessment Upper Extremity Assessment: Generalized weakness    Lower  Extremity Assessment Lower Extremity Assessment: Generalized weakness    Cervical / Trunk Assessment Cervical / Trunk Assessment: Kyphotic  Communication   Communication: HOH  Cognition Arousal/Alertness: Awake/alert Behavior During Therapy: Anxious Overall Cognitive Status: No family/caregiver present to determine baseline cognitive functioning                                 General Comments: spoke with daughter on the phone who reports above information, but  patient in room yelling out when I entered, easily redirected as she wanted to get up, but anxious and asking what's next each step during toileting and was incontinent along the way to and from bathroom, but toileted while there, she is also [facility]      General Comments General comments (skin integrity, edema, etc.): HR up to 140's with ambulation in room and SpO2 86% after ambulating on 2L O2, increased to 90 after resting in bed 2 minutes    Exercises     Assessment/Plan    PT Assessment Patient needs continued PT services  PT Problem List Decreased strength;Decreased mobility;Decreased activity tolerance;Cardiopulmonary status limiting activity;Decreased knowledge of use of DME       PT Treatment Interventions DME instruction;Therapeutic activities;Gait training;Therapeutic exercise;Patient/family education;Balance training;Functional mobility training    PT Goals (Current goals can be found in the Care Plan section)  Acute Rehab PT Goals Patient Stated Goal: None stated, wants to get OOB PT Goal Formulation: Patient unable to participate in goal setting Time For Goal Achievement: 01/08/20 Potential to Achieve Goals: Fair    Frequency Min 2X/week   Barriers to discharge        Co-evaluation               AM-PAC PT "6 Clicks" Mobility  Outcome Measure Help needed turning from your back to your side while in a flat bed without using bedrails?: A Little Help needed moving from lying on your back to sitting on the side of a flat bed without using bedrails?: A Little Help needed moving to and from a bed to a chair (including a wheelchair)?: A Little Help needed standing up from a chair using your arms (e.g., wheelchair or bedside chair)?: A Little Help needed to walk in hospital room?: A Little Help needed climbing 3-5 steps with a railing? : A Lot 6 Click Score: 17    End of Session   Activity Tolerance: Patient tolerated treatment well Patient left: in bed;with call  bell/phone within reach;with bed alarm set   PT Visit Diagnosis: Other abnormalities of gait and mobility (R26.89);Difficulty in walking, not elsewhere classified (R26.2)    Time: 3244-0102 PT Time Calculation (min) (ACUTE ONLY): 40 min   Charges:   PT Evaluation $PT Eval Moderate Complexity: 1 Mod PT Treatments $Gait Training: 8-22 mins $Therapeutic Activity: 8-22 mins        Magda Kiel, PT Acute Rehabilitation Services VOZDG:[phone] Office:[phone] 12/25/2019   Reginia Naas 12/25/2019, 4:11 PM

## 2019-12-26 ENCOUNTER — Inpatient Hospital Stay (HOSPITAL_COMMUNITY): Payer: Medicare Other

## 2019-12-26 DIAGNOSIS — I4891 Unspecified atrial fibrillation: Secondary | ICD-10-CM

## 2019-12-26 DIAGNOSIS — R7401 Elevation of levels of liver transaminase levels: Secondary | ICD-10-CM

## 2019-12-26 LAB — CBC WITH DIFFERENTIAL/PLATELET
Abs Immature Granulocytes: 0.03 10*3/uL (ref 0.00–0.07)
Basophils Absolute: 0 10*3/uL (ref 0.0–0.1)
Basophils Relative: 0 %
Eosinophils Absolute: 0 10*3/uL (ref 0.0–0.5)
Eosinophils Relative: 0 %
HCT: 38 % (ref 36.0–46.0)
Hemoglobin: 12.2 g/dL (ref 12.0–15.0)
Immature Granulocytes: 1 %
Lymphocytes Relative: 10 %
Lymphs Abs: 0.4 10*3/uL — ABNORMAL LOW (ref 0.7–4.0)
MCH: 29.5 pg (ref 26.0–34.0)
MCHC: 32.1 g/dL (ref 30.0–36.0)
MCV: 91.8 fL (ref 80.0–100.0)
Monocytes Absolute: 0.2 10*3/uL (ref 0.1–1.0)
Monocytes Relative: 6 %
Neutro Abs: 3.3 10*3/uL (ref 1.7–7.7)
Neutrophils Relative %: 83 %
Platelets: 152 10*3/uL (ref 150–400)
RBC: 4.14 MIL/uL (ref 3.87–5.11)
RDW: 18.5 % — ABNORMAL HIGH (ref 11.5–15.5)
WBC: 3.9 10*3/uL — ABNORMAL LOW (ref 4.0–10.5)
nRBC: 0 % (ref 0.0–0.2)

## 2019-12-26 LAB — COMPREHENSIVE METABOLIC PANEL
ALT: 115 U/L — ABNORMAL HIGH (ref 0–44)
AST: 143 U/L — ABNORMAL HIGH (ref 15–41)
Albumin: 2.1 g/dL — ABNORMAL LOW (ref 3.5–5.0)
Alkaline Phosphatase: 150 U/L — ABNORMAL HIGH (ref 38–126)
Anion gap: 10 (ref 5–15)
BUN: 37 mg/dL — ABNORMAL HIGH (ref 8–23)
CO2: 28 mmol/L (ref 22–32)
Calcium: 7.9 mg/dL — ABNORMAL LOW (ref 8.9–10.3)
Chloride: 104 mmol/L (ref 98–111)
Creatinine, Ser: 0.9 mg/dL (ref 0.44–1.00)
GFR calc non Af Amer: 56 mL/min — ABNORMAL LOW (ref >60)
Glucose, Bld: 175 mg/dL — ABNORMAL HIGH (ref 70–99)
Potassium: 3.8 mmol/L (ref 3.5–5.1)
Sodium: 142 mmol/L (ref 135–145)
Total Bilirubin: 0.4 mg/dL (ref 0.3–1.2)
Total Protein: 5.2 g/dL — ABNORMAL LOW (ref 6.5–8.1)

## 2019-12-26 LAB — C-REACTIVE PROTEIN: CRP: 6 mg/dL — ABNORMAL HIGH (ref <1.0)

## 2019-12-26 LAB — BRAIN NATRIURETIC PEPTIDE: B Natriuretic Peptide: 1160.1 pg/mL — ABNORMAL HIGH (ref 0.0–100.0)

## 2019-12-26 LAB — D-DIMER, QUANTITATIVE: D-Dimer, Quant: 1.05 ug/mL-FEU — ABNORMAL HIGH (ref 0.00–0.50)

## 2019-12-26 MED ORDER — DIGOXIN 0.25 MG/ML IJ SOLN: 0.2500 mg | Freq: Once | INTRAMUSCULAR | Status: DC

## 2019-12-26 MED ORDER — FUROSEMIDE 10 MG/ML IJ SOLN
40.0000 mg | Freq: Once | INTRAMUSCULAR | Status: AC
  Administered 2019-12-26: 40 mg via INTRAVENOUS
  Filled 2019-12-26: qty 4

## 2019-12-26 MED ORDER — MUPIROCIN 2 % EX OINT
1.0000 "application " | TOPICAL_OINTMENT | Freq: Two times a day (BID) | CUTANEOUS | Status: AC
  Administered 2019-12-27 – 2019-12-31 (×10): 1 via NASAL
  Filled 2019-12-26: qty 22

## 2019-12-26 NOTE — Evaluation (Signed)
Clinical/Bedside Swallow Evaluation Patient Details  Name: Shirley Bates MRN: [id number] Date of Birth: 01/17/1926  Today's Date: 12/26/2019 Time: SLP Start Time (ACUTE ONLY): 8676 SLP Stop Time (ACUTE ONLY): 1406 SLP Time Calculation (min) (ACUTE ONLY): 13 min  Past Medical History:  Past Medical History:  Diagnosis Date  . Atrial fibrillation status post cardioversion ([place]) 10/12/2019   With recurrent A. fib post cardioversion (was placed on amiodarone and had TEE cardioversion)  . Cancer ([place])   . Hyperlipidemia   . Hypertension    Past Surgical History:  Past Surgical History:  Procedure Laterality Date  . 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 CARDIOVERSION N/A 10/16/2019   Procedure: CARDIOVERSION;  Surgeon: Skeet Latch, MD;  Location: [facility];  Service: Cardiovascular;;;  Post procedure patient had little bursts of A. fib.  Was given 150 amiodarone bolus  . CARDIOVERSION N/A 11/16/2019   Procedure: CARDIOVERSION;  Surgeon: Acie Fredrickson Wonda Cheng, MD;  Location: [facility] [place];  Service: Cardiovascular;  Laterality: N/A;  . TEE WITHOUT CARDIOVERSION N/A 10/16/2019   Procedure: TRANSESOPHAGEAL ECHOCARDIOGRAM (TEE);  Surgeon: Skeet Latch, MD;  Location: [facility];  Service: Cardiovascular;;  EF estimated 30 to 35% moderate decreased function.  Moderately reduced RV function.  No LAA thrombus.  Mild MR with multiple jets.  Mild to moderate TR.  Grade 2 aortic atheroma.   . TRANSTHORACIC ECHOCARDIOGRAM  10/12/2019   EF 40 and 45%.  Moderate biatrial enlargement.  Mild to moderate AI.   HPI:  Shirley Bates is a 86 y.o. female with history of cardiomyopathy, A. fib, hypertension, severe dementia was found to be Covid positive 10/5.  Admitted due to fever and hypoxia requiring 4L O2 and CXR showing new right upper lobe airspace opacity consistent with the given clinical history.   Assessment / Plan / Recommendation Clinical Impression  Pt exhibits a cough at baseline  prior to consuming solids or liquids that does not increase with sips thin or puree. Swallow appeared timely from subjective judgement. Sips thin did not yield s/s aspiration and she expectorated the graham cracker stating she was able to chew it but full. It is recommended she continue soft texture, thin liquids, straws allowed and pills crushed. No further ST needed.   SLP Visit Diagnosis: Dysphagia, unspecified (R13.10)    Aspiration Risk  Moderate aspiration risk    Diet Recommendation     Liquid Administration via: Cup;Straw Medication Administration: Whole meds with puree Supervision: Patient able to self feed;Full supervision/cueing for compensatory strategies Compensations: Minimize environmental distractions;Small sips/bites;Slow rate Postural Changes: Seated upright at 90 degrees    Other  Recommendations Oral Care Recommendations: Oral care BID   Follow up Recommendations None      Frequency and Duration            Prognosis Barriers to Reach Goals: Cognitive deficits      Swallow Study   General HPI: Shirley Bates is a 86 y.o. female with history of cardiomyopathy, A. fib, hypertension, severe dementia was found to be Covid positive 10/5.  Admitted due to fever and hypoxia requiring 4L O2 and CXR showing new right upper lobe airspace opacity consistent with the given clinical history. Type of Study: Bedside Swallow Evaluation Previous Swallow Assessment:  (none) Diet Prior to this Study: Other (Comment);Thin liquids (soft) Temperature Spikes Noted: No Respiratory Status: Nasal cannula History of Recent Intubation: No Behavior/Cognition: Alert;Cooperative;Pleasant mood;Confused;Distractible;Requires cueing Oral Cavity Assessment: Within Functional Limits Oral Care Completed by SLP: No  Oral Cavity - Dentition: Adequate natural dentition Vision: Functional for self-feeding Self-Feeding Abilities: Needs assist;Able to feed self Patient Positioning: Upright in bed     Oral/Motor/Sensory Function     Ice Chips Ice chips: Within functional limits   Thin Liquid Presentation: Cup;Straw    Nectar Thick Nectar Thick Liquid: Not tested   Honey Thick Honey Thick Liquid: Not tested   Puree Puree: Within functional limits Presentation: Spoon   Solid     Solid: Impaired Presentation: Self Fed Oral Phase Impairments: Other (comment) Oral Phase Functional Implications: Other (comment) (expectorated cracker)      Mick Sell, Orbie Pyo 12/26/2019,3:30 PM  Orbie Pyo [place].Ed Risk analyst [phone] Office [phone]

## 2019-12-26 NOTE — TOC Initial Note (Signed)
Transition of Care [facility]) - Initial/Assessment Note    Patient Details  Name: Shirley Bates MRN: [id number] Date of Birth: [birth year]  Transition of Care [facility]) CM/SW Contact:    Angelita Ingles, RN Phone Number: [phone]  12/26/2019, 10:15 AM  Clinical Narrative:      86 year old female with advanced dementia admitted from [facility] dementia unit. Daughter [name]) who is also POA has been notified by CM to verify that daughter wishes for patient to return to [facility]. Daughter does wish for patient to return to [facility]. CM has reached out to [place] verify that patient is in good standing to return and confirm what is needed for return. Message has been left for Valerie Salts to return the call. CM will continue to follow.              Expected Discharge Plan: Skilled Nursing Facility Barriers to Discharge: Continued Medical Work up   Patient Goals and CMS Choice Patient states their goals for this hospitalization and ongoing recovery are:: Patient has advanced dementia   Choice offered to / list presented to : NA  Expected Discharge Plan and Services Expected Discharge Plan: [place] In-house Referral: NA Discharge Planning Services: CM Consult Post Acute Care Choice: NA Living arrangements for the past 2 months: [place]                 DME Arranged: N/A DME Agency: NA       HH Arranged: NA HH Agency: NA        Prior Living Arrangements/Services Living arrangements for the past 2 months: [place] Lives with:: Facility Resident Patient language and need for interpreter reviewed:: Yes        Need for Family Participation in Patient Care: Yes (Comment) Care giver support system in place?: Yes (comment)   Criminal Activity/Legal Involvement Pertinent to Current Situation/Hospitalization: No - Comment as needed  Activities of Daily Living      Permission Sought/Granted Permission  sought to share information with : Other (comment) (Patioents daughter is her POA)                Emotional Assessment Appearance:: Other (Comment Required (unable to assess covid positive patient) Attitude/Demeanor/Rapport: Unable to Assess Affect (typically observed): Unable to Assess Orientation: :  (advanced dementia) Alcohol / Substance Use: Not Applicable Psych Involvement: No (comment)  Admission diagnosis:  Acute respiratory failure ([place]) [J96.00] Acute respiratory failure with hypoxia ([place]) [J96.01] Atrial fibrillation with RVR (HCC) [I48.91] Acute respiratory disease due to COVID-19 virus [U07.1, J06.9] COVID-19 [U07.1] Patient Active Problem List   Diagnosis Date Noted  . Pressure injury of skin 12/25/2019  . Acute respiratory failure ([place]) 12/24/2019  . Acute respiratory disease due to COVID-19 virus 12/24/2019  . Acute systolic heart failure ([place]) 11/18/2019  . Persistent atrial fibrillation ([place])   . Atrial fibrillation, new onset ([place]) 11/08/2019  . Impaired mobility and activities of daily living 10/14/2019  . Atrial fibrillation with RVR ([place]) 10/12/2019  . Fluid overload 10/12/2019  . Anemia 10/12/2019  . Dilated cardiomyopathy ([place]) 10/12/2019  . Osteoporosis 05/28/2015  . Left ear impacted cerumen 12/12/2012  . OA (osteoarthritis) of neck 12/12/2012  . Hyperlipidemia   . Essential hypertension    PCP:  Jeanette Caprice, PA-C Pharmacy:   [facility] [place], [place] [address] Phone: [phone] Fax: [phone]  [facility] [place], [place] [address] Phone: [phone] Fax: [phone]  [facility] [place], [place] [address] [place] [address] Phone: [phone] Fax:  [phone]     Social Determinants of Health ([place]) Interventions    Readmission Risk Interventions Readmission Risk Prevention Plan 12/26/2019  Transportation Screening Complete  PCP or Specialist Appt within 3-5 Days Complete  HRI or Home Care Consult Complete  Social Work Consult for [place] Planning/Counseling Complete  Palliative Care Screening Not Applicable  Medication Review Press photographer) Referral to Pharmacy  Some recent data might be hidden

## 2019-12-26 NOTE — Plan of Care (Signed)
  Problem: Clinical Measurements: Goal: Ability to maintain clinical measurements within normal limits will improve Outcome: Progressing Goal: Will remain free from infection Outcome: Progressing Goal: Diagnostic test results will improve Outcome: Progressing Goal: Respiratory complications will improve Outcome: Progressing Goal: Cardiovascular complication will be avoided Outcome: Progressing   Problem: Activity: Goal: Risk for activity intolerance will decrease Outcome: Progressing   Problem: Nutrition: Goal: Adequate nutrition will be maintained Outcome: Progressing   Problem: Coping: Goal: Level of anxiety will decrease Outcome: Progressing   Problem: Elimination: Goal: Will not experience complications related to bowel motility Outcome: Progressing Goal: Will not experience complications related to urinary retention Outcome: Progressing   Problem: Pain Managment: Goal: General experience of comfort will improve Outcome: Progressing   Problem: Safety: Goal: Ability to remain free from injury will improve Outcome: Progressing   Problem: Skin Integrity: Goal: Risk for impaired skin integrity will decrease Outcome: Progressing   Problem: Education: Goal: Knowledge of General Education information will improve Description: Including pain rating scale, medication(s)/side effects and non-pharmacologic comfort measures Outcome: Not Met (add Reason)   Problem: Health Behavior/Discharge Planning: Goal: Ability to manage health-related needs will improve Outcome: Not Met (add Reason)

## 2019-12-26 NOTE — Progress Notes (Signed)
PROGRESS NOTE                                                                                                                                                                                                             Patient Demographics:    Shirley Bates, is a 86 y.o. female, DOB - [birth year], YIR:[id number]  Admit date - 12/24/2019   Admitting Physician Rise Patience, MD  Outpatient Primary MD for the patient is Kurth-Bowen, Cornelia, PA-C  LOS - 2  Chief Complaint  Patient presents with  . Covid Positive  . Cough  . Shortness of Breath       Brief Narrative  Shirley Bates is a 86 y.o. female with history of cardiomyopathy, A. fib, hypertension, severe dementia was found to be Covid positive yesterday.  As per the patient's daughter one of the residents at the living facility was tested positive last week and subsequently all the residents were being checked alternatingly every other day for Covid test.  Patient turned out to be Covid positive yesterday but was asymptomatic.  But since this morning patient was getting more febrile and was brought to the ER.   Subjective:    Shirley Bates today she herself denies any complaints, but has increased oxygen requirement up to 5 L, she is pleasantly confused and demented. No significant events as discussed with staff.    Assessment  & Plan :    Acute respiratory failure with hypoxia secondary to COVID-19 infection  -She is on 4 to 5 L oxygen this morning . -Continue with IV steroids . -Continue with IV remdesivir . -  Patient's daughter has consented to the use of baricitinib if required.  Since the infiltrate is more prominent on the right side and procalcitonin was borderline we will put her on Unasyn and request speech to rule out any ongoing aspiration, currently appears nontoxic and does not clinically seems to have a flaring bacterial infection, due to her age she is tenuous, will encourage her to sit up in chair use  I-S and flutter valve for pulmonary toiletry, advance activity and titrate down oxygen as much as we can.    Recent Labs  Lab 12/24/19 1737 12/24/19 1739 12/24/19 1839 12/25/19 0047 12/26/19 0339 12/26/19 0348  WBC 4.0  --   --  3.1* 3.9*  --   CRP 6.1*  --   --  8.1* 6.0*  --   DDIMER 1.18*  --   --  1.25* 1.05*  --  BNP  --   --   --  843.9*  --  1,160.1*  PROCALCITON 0.15  --   --   --   --   --   LATICACIDVEN  --  1.0  --   --   --   --   AST 214*  --   --  248* 143*  --   ALT 100*  --   --  137* 115*  --   ALKPHOS 166*  --   --  186* 150*  --   BILITOT 0.8  --   --  1.0 0.4  --   ALBUMIN 2.0*  --   --  2.5* 2.1*  --   SARSCOV2NAA  --   --  POSITIVE*  --   --   --     Markedly elevated LFTs  -This is most likely in the setting of COVID-19 pneumonia, disease as evident on ultrasound, continue to monitor closely specially she is on amiodarone and Remdesivir .  A. fib with RVR -Heart rate is fluctuating, continue with home medication including amiodarone and beta-blockers -She is on Eliquis for anticoagulation.  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  On Lasix every other day.  Also on ARB. -Diuresis as needed given tenuous blood pressure, will give extra milligram of IV Lasix today, meanwhile continue with home dose every other day.  Right Pleural  effusion -Most likely due to CHF, will obtain CT chest and if significant will request thoracentesis  Severe dementia.  No acute issues.  Remains at risk for delirium.  Minimize narcotics and benzodiazepines.    Condition - Extremely Guarded  Family Communication  :  Daughter [phone] on 12/25/19,and 12/26/2019 DNR, medical treatment.  If any decline focus more on comfort.  Code Status :  DNR  Consults  :  None  Procedures  :  None  PUD Prophylaxis : PPI  Disposition Plan  :    Status is: Inpatient  Remains inpatient appropriate because:IV treatments appropriate due to  intensity of illness or inability to take PO   Dispo: The patient is from: SNF              Anticipated d/c is to: Home              Anticipated d/c date is: > 3 days              Patient currently is not medically stable to d/c.    DVT Prophylaxis  :  Eliquis  Lab Results  Component Value Date   PLT 152 12/26/2019    Diet :  Diet Order            DIET SOFT Room service appropriate? Yes; Fluid consistency: Thin; Fluid restriction: 1200 mL Fluid  Diet effective now                  Inpatient Medications Scheduled Meds: . amiodarone  200 mg Oral Daily  . apixaban  2.5 mg Oral BID  . ferrous sulfate  325 mg Oral Q breakfast  . furosemide  20 mg Oral Q M,W,F  . losartan  25 mg Oral Daily  . methylPREDNISolone (SOLU-MEDROL) injection  60 mg Intravenous Q12H  . metoprolol succinate  100 mg Oral Daily  . pantoprazole  40 mg Oral Daily  . simvastatin  20 mg Oral QHS   Continuous Infusions: . ampicillin-sulbactam (UNASYN) IV 3 g (12/26/19 0940)  .  remdesivir 100 mg in NS 100 mL 100 mg (12/26/19 0833)   PRN Meds:.acetaminophen **OR** [DISCONTINUED] acetaminophen, chlorpheniramine-HYDROcodone, guaiFENesin-dextromethorphan, haloperidol lactate, [DISCONTINUED] ondansetron **OR** ondansetron (ZOFRAN) IV  Antibiotics  :   Anti-infectives (From admission, onward)   Start     Dose/Rate Route Frequency Ordered Stop   12/25/19 1015  Ampicillin-Sulbactam (UNASYN) 3 g in sodium chloride 0.9 % 100 mL IVPB        3 g 200 mL/hr over 30 Minutes Intravenous Every 12 hours 12/25/19 1008     12/25/19 1000  remdesivir 100 mg in sodium chloride 0.9 % 100 mL IVPB       "Followed by" Linked Group Details   100 mg 200 mL/hr over 30 Minutes Intravenous Daily 12/24/19 2133 12/29/19 0959   12/25/19 0600  doxycycline (VIBRAMYCIN) 100 mg in sodium chloride 0.9 % 250 mL IVPB  Status:  Discontinued        100 mg 125 mL/hr over 120 Minutes Intravenous Every 12 hours 12/25/19 0534 12/25/19 0957    12/24/19 2200  remdesivir 200 mg in sodium chloride 0.9% 250 mL IVPB       "Followed by" Linked Group Details   200 mg 580 mL/hr over 30 Minutes Intravenous Once 12/24/19 2133 12/25/19 0903          Objective:   Vitals:   12/25/19 1726 12/25/19 2037 12/26/19 0647 12/26/19 0744  BP: (!) 127/96 (!) 136/98 128/86 (!) 125/96  Pulse: 92 (!) 44 (!) 44 (!) 111  Resp: 16 20 18 20   Temp: 97.6 F (36.4 C) 98 F (36.7 C) 98 F (36.7 C) 98.4 F (36.9 C)  TempSrc:      SpO2: 99% 97% 97% 94%  Weight:        SpO2: 94 % O2 Flow Rate (L/min): 5 L/min  Wt Readings from Last 3 Encounters:  12/24/19 58.1 kg  12/03/19 58.1 kg  11/19/19 56.7 kg     Intake/Output Summary (Last 24 hours) at 12/26/2019 1158 Last data filed at 12/26/2019 1000 Gross per 24 hour  Intake 120 ml  Output 600 ml  Net -480 ml     Physical Exam  Awake Alert, pleasant, but significantly demented Symmetrical Chest wall movement, Minister entry at the bases R>L RRR,No Gallops,Rubs or new Murmurs, No Parasternal Heave +ve B.Sounds, Abd Soft, No tenderness, No rebound - guarding or rigidity. No Cyanosis, Clubbing ,+1 edema   Pressure Injury 12/25/19 Coccyx Medial Stage 2 -  Partial thickness loss of dermis presenting as a shallow open injury with a red, pink wound bed without slough. (Active)  12/25/19 0100  Location: Coccyx  Location Orientation: Medial  Staging: Stage 2 -  Partial thickness loss of dermis presenting as a shallow open injury with a red, pink wound bed without slough.  Wound Description (Comments):   Present on Admission: Yes     Data Review:   Recent Labs  Lab 12/24/19 1737 12/25/19 0047 12/26/19 0339  WBC 4.0 3.1* 3.9*  HGB 10.9* 11.0* 12.2  HCT 33.9* 35.2* 38.0  PLT 155 179 152  MCV 92.4 93.6 91.8  MCH 29.7 29.3 29.5  MCHC 32.2 31.3 32.1  RDW 18.6* 18.8* 18.5*  LYMPHSABS 0.4* 0.1* 0.4*  MONOABS 0.3 0.1 0.2  EOSABS 0.0 0.0 0.0  BASOSABS 0.0 0.0 0.0    Recent Labs  Lab  12/24/19 1737 12/24/19 1739 12/25/19 0047 12/25/19 0841 12/26/19 0339 12/26/19 0348  NA 138  --  139  --  142  --  K 3.2*  --  4.6  --  3.8  --   CL 107  --  101  --  104  --   CO2 23  --  29  --  28  --   GLUCOSE 115*  --  146*  --  175*  --   BUN 20  --  26*  --  37*  --   CREATININE 0.78  --  0.95  --  0.90  --   CALCIUM 7.0*  --  8.1*  --  7.9*  --   AST 214*  --  248*  --  143*  --   ALT 100*  --  137*  --  115*  --   ALKPHOS 166*  --  186*  --  150*  --   BILITOT 0.8  --  1.0  --  0.4  --   ALBUMIN 2.0*  --  2.5*  --  2.1*  --   MG  --   --   --  2.1  --   --   CRP 6.1*  --  8.1*  --  6.0*  --   DDIMER 1.18*  --  1.25*  --  1.05*  --   PROCALCITON 0.15  --   --   --   --   --   LATICACIDVEN  --  1.0  --   --   --   --   BNP  --   --  843.9*  --   --  1,160.1*    Recent Labs  Lab 12/24/19 1737 12/24/19 1839 12/25/19 0047 12/26/19 0339 12/26/19 0348  CRP 6.1*  --  8.1* 6.0*  --   DDIMER 1.18*  --  1.25* 1.05*  --   BNP  --   --  843.9*  --  1,160.1*  PROCALCITON 0.15  --   --   --   --   SARSCOV2NAA  --  POSITIVE*  --   --   --     ------------------------------------------------------------------------------------------------------------------ Recent Labs    12/24/19 1739  TRIG 40    Lab Results  Component Value Date   HGBA1C 5.8 (H) 04/30/2014   ------------------------------------------------------------------------------------------------------------------ No results for input(s): TSH, T4TOTAL, T3FREE, THYROIDAB in the last 72 hours.  Invalid input(s): FREET3 ------------------------------------------------------------------------------------------------------------------ Recent Labs    12/24/19 1737  FERRITIN 484*    Coagulation profile No results for input(s): INR, PROTIME in the last 168 hours.  Recent Labs    12/25/19 0047 12/26/19 0339  DDIMER 1.25* 1.05*    Cardiac Enzymes No results for input(s): CKMB, TROPONINI, MYOGLOBIN in  the last 168 hours.  Invalid input(s): CK ------------------------------------------------------------------------------------------------------------------    Component Value Date/Time   BNP 1,160.1 (H) 12/26/2019 0348    Micro Results Recent Results (from the past 240 hour(s))  Blood Culture (routine x 2)     Status: None (Preliminary result)   Collection Time: 12/24/19  5:37 PM   Specimen: BLOOD LEFT ARM  Result Value Ref Range Status   Specimen Description BLOOD LEFT ARM  Final   Special Requests   Final    BOTTLES DRAWN AEROBIC AND ANAEROBIC Blood Culture adequate volume   Culture   Final    NO GROWTH 2 DAYS Performed at [facility], [address], [place], [place] [address]    Report Status PENDING  Incomplete  Blood Culture (routine x 2)     Status: None (Preliminary result)   Collection Time: 12/24/19  5:51 PM   Specimen: BLOOD RIGHT HAND  Result Value Ref Range Status   Specimen Description BLOOD RIGHT HAND  Final   Special Requests   Final    BOTTLES DRAWN AEROBIC AND ANAEROBIC Blood Culture adequate volume   Culture   Final    NO GROWTH 2 DAYS Performed at [facility], [address] [place], [place] [address]    Report Status PENDING  Incomplete  Respiratory Panel by RT PCR (Flu A&B, Covid) - Nasopharyngeal Swab     Status: Abnormal   Collection Time: 12/24/19  6:39 PM   Specimen: Nasopharyngeal Swab  Result Value Ref Range Status   SARS Coronavirus 2 by RT PCR POSITIVE (A) NEGATIVE Final    Comment: RESULT CALLED TO, READ BACK BY AND VERIFIED WITH: Trudi Ida RN 2030 12/24/19 A BROWNING (NOTE) SARS-CoV-2 target nucleic acids are DETECTED.  SARS-CoV-2 RNA is generally detectable in upper respiratory specimens  during the acute phase of infection. Positive results are indicative of the presence of the identified virus, but do not rule out bacterial infection or co-infection with other pathogens not detected by the test. Clinical correlation  with patient history and other diagnostic information is necessary to determine patient infection status. The expected result is Negative.  Fact Sheet for Patients:  PinkCheek.be  Fact Sheet for Healthcare Providers: GravelBags.it  This test is not yet approved or cleared by the [place] FDA and  has been authorized for detection and/or diagnosis of SARS-CoV-2 by FDA under an Emergency Use Authorization (EUA).  This EUA will remain in effect (meaning this test can be  used) for the duration of  the COVID-19 declaration under Section 564(b)(1) of the Act, 21 U.S.C. section 360bbb-3(b)(1), unless the authorization is terminated or revoked sooner.      Influenza A by PCR NEGATIVE NEGATIVE Final   Influenza B by PCR NEGATIVE NEGATIVE Final    Comment: (NOTE) The Xpert Xpress SARS-CoV-2/FLU/RSV assay is intended as an aid in  the diagnosis of influenza from Nasopharyngeal swab specimens and  should not be used as a sole basis for treatment. Nasal washings and  aspirates are unacceptable for Xpert Xpress SARS-CoV-2/FLU/RSV  testing.  Fact Sheet for Patients: PinkCheek.be  Fact Sheet for Healthcare Providers: GravelBags.it  This test is not yet approved or cleared by the [place] FDA and  has been authorized for detection and/or diagnosis of SARS-CoV-2 by  FDA under an Emergency Use Authorization (EUA). This EUA will remain  in effect (meaning this test can be used) for the duration of the  Covid-19 declaration under Section 564(b)(1) of the Act, 21  U.S.C. section 360bbb-3(b)(1), unless the authorization is  terminated or revoked. Performed at [facility], [place] [address] [place], [place] [address]   MRSA PCR Screening     Status: Abnormal   Collection Time: 12/25/19 11:38 AM   Specimen: Nasal Mucosa; Nasopharyngeal  Result Value Ref Range Status    MRSA by PCR POSITIVE (A) NEGATIVE Final    Comment:        The GeneXpert MRSA Assay (FDA approved for NASAL specimens only), is one component of a comprehensive MRSA colonization surveillance program. It is not intended to diagnose MRSA infection nor to guide or monitor treatment for MRSA infections. RESULT CALLED TO, READ BACK BY AND VERIFIED WITH: Genella Rife RN 15:30 12/25/19 ([email or web address]) Performed at [facility], [place] [address] [place], [place] [address]     Radiology Reports DG  Chest Port 1 View  Result Date: 12/24/2019 CLINICAL DATA:  COVID-19 positivity with cough and chest congestion EXAM: PORTABLE CHEST 1 VIEW COMPARISON:  11/18/2018 FINDINGS: Cardiac shadow is enlarged but stable. Aortic calcifications are again seen. Patchy airspace opacity is noted in the right apex new from the prior exam consistent with the given clinical history. Patchy bibasilar opacities are again seen. No acute bony abnormality is noted. IMPRESSION: New right upper lobe airspace opacity consistent with the given clinical history. Electronically Signed   By: Inez Catalina M.D.   On: 12/24/2019 18:31   US Abdomen Limited RUQ  Result Date: 12/26/2019 CLINICAL DATA:  Transaminitis EXAM: ULTRASOUND ABDOMEN LIMITED RIGHT UPPER QUADRANT COMPARISON:  04/25/2019 FINDINGS: Gallbladder: There is pericholecystic free fluid with mild gallbladder wall thickening with the gallbladder wall measuring approximately 3 mm. There are no gallstones. The sonographic Percell Miller sign is reported as negative. Common bile duct: Diameter: 3 mm Liver: The liver parenchyma is coarsened and heterogeneous. Liver surface appears somewhat nodular. Portal vein is patent on color Doppler imaging with normal direction of blood flow towards the liver. Other: There is a large right-sided pleural effusion. There is a small volume of ascites in the upper abdomen. IMPRESSION: 1. Gallbladder wall thickening without cholelithiasis or definite  sonographic evidence for acute cholecystitis. Gallbladder wall thickening is a nonspecific finding and can be seen in patients with ascites or heart failure. 2. Incidentally noted large right-sided pleural effusion. 3. Small volume ascites. 4. Again noted is a coarsened and heterogeneous appearance of the liver parenchyma consistent with underlying hepatocellular disease. Electronically Signed   By: Constance Holster M.D.   On: 12/26/2019 01:45    Phillips Climes M.D on 12/26/2019 at 11:58 AM  To page go to [email or web address]

## 2019-12-26 NOTE — Progress Notes (Signed)
Spoke with [name] daughter of patient regarding setting up a time for the patient to have a factime phone call with her daughter and to request Dr. [facility] to call with an update on her status.

## 2019-12-26 NOTE — Progress Notes (Addendum)
Occupational Therapy Evaluation Patient Details Name: Shirley Bates MRN: [id number] DOB: 1926-01-07 Today's Date: 12/26/2019    History of Present Illness Shirley Bates is a 86 y.o. female with history of cardiomyopathy, A. fib, hypertension, severe dementia was found to be Covid positive.  LIves in [place] where she was ambulatory adn assisted with her ADL tasks. Admitted due to fever and hypoxia requiring 4L O2 and CDR showing infiltrates.   Clinical Impression   PTA, pt lived at [place] (memory care unit) and was modified independent with mobility, dressed and fed herself. Pt seen on 4L with VSS. SpO2 desat briefly to 87 with 2/4 DOE with max HR 125 when ambulating to the bathroom. Pt able to assist with pericare, however also incontinent once back to bed. Pt min guard A with mobility and min A with ADL @ RW level. Recommend pt return to ALF however important for pt to ambulate with staff to decrease restlessness and maintain strength needed to return to assisted living setting. Will follow acutely.  Pt thanking this therapist at end of session for helping her.  Follow Up Recommendations  Home health OT;Supervision/Assistance - 24 hour (Return to ALF)    Equipment Recommendations  None recommended by OT    Recommendations for Other Services       Precautions / Restrictions Precautions Precautions: Fall      Mobility Bed Mobility Overal bed mobility: Needs Assistance Bed Mobility: Supine to Sit;Sit to Supine     Supine to sit: Min assist Sit to supine: Min assist   General bed mobility comments: Pt required A to move to EOB adn A to lift legs back onto bed  Transfers Overall transfer level: Needs assistance Equipment used: Rolling walker (2 wheeled) Transfers: Sit to/from [facility] Sit to Stand: Min guard              Balance Overall balance assessment: Mild deficits observed, not formally tested                                          ADL either performed or assessed with clinical judgement   ADL Overall ADL's : Needs assistance/impaired Eating/Feeding: Set up   Grooming: Set up;Supervision/safety;Sitting   Upper Body Bathing: Supervision/ safety;Set up;Sitting   Lower Body Bathing: Minimal assistance;Sit to/from stand   Upper Body Dressing : Set up;Supervision/safety;Sitting   Lower Body Dressing: Minimal assistance;Sit to/from stand   Toilet Transfer: Ambulation;RW;Grab bars;Min guard   Toileting- Clothing Manipulation and Hygiene: Moderate assistance;Sit to/from stand Toileting - Clothing Manipulation Details (indicate cue type and reason): urinated adn had BM on toilet however incontinenet of BM once back in bed     Functional mobility during ADLs: Min guard;Rolling walker       Vision         Perception     Praxis      Pertinent Vitals/Pain Pain Assessment: Faces Faces Pain Scale: Hurts a little bit Pain Location: general discomfort Pain Descriptors / Indicators: Grimacing Pain Intervention(s): Limited activity within patient's tolerance     Hand Dominance Right   Extremity/Trunk Assessment Upper Extremity Assessment Upper Extremity Assessment: Generalized weakness   Lower Extremity Assessment Lower Extremity Assessment: Defer to PT evaluation   Cervical / Trunk Assessment Cervical / Trunk Assessment: Kyphotic   Communication Communication Communication: HOH   Cognition Arousal/Alertness: Awake/alert Behavior During Therapy: Restless Overall Cognitive  Status: No family/caregiver present to determine baseline cognitive functioning                                 General Comments: most likely more confused from beign in unfamiliar environment; followed commands adn approrpiate during session wtih redirection to task   General Comments  VSS on 4L with Max HR 125    Exercises     Shoulder Instructions      Home Living Family/patient expects to  be discharged to:: Assisted living                             Home Equipment: Shower seat - built in;Grab bars - toilet;Grab bars - tub/shower;Hand held Tourist information centre manager - 4 wheels   Additional Comments: [facility]      Prior Functioning/Environment Level of Independence: Needs assistance  Gait / Transfers Assistance Needed: [place] or rollator with mod I ADL's / Homemaking Assistance Needed: ALF assists with bathing, dresses herself, toilets herself Communication / Swallowing Assistance Needed: HOH          OT Problem List: Decreased strength;Decreased activity tolerance;Decreased cognition;Decreased safety awareness;Decreased knowledge of use of DME or AE;Cardiopulmonary status limiting activity;Pain      OT Treatment/Interventions: Self-care/ADL training;Therapeutic exercise;Neuromuscular education;Energy conservation;DME and/or AE instruction;Therapeutic activities;Cognitive remediation/compensation;Patient/family education;Balance training    OT Goals(Current goals can be found in the care plan section) Acute Rehab OT Goals Patient Stated Goal: to "get up" OT Goal Formulation: Patient unable to participate in goal setting Time For Goal Achievement: 01/05/2020 Potential to Achieve Goals: Good  OT Frequency: Min 2X/week   Barriers to D/C:            Co-evaluation              AM-PAC OT "6 Clicks" Daily Activity     Outcome Measure Help from another person eating meals?: A Little Help from another person taking care of personal grooming?: A Little Help from another person toileting, which includes using toliet, bedpan, or urinal?: A Lot Help from another person bathing (including washing, rinsing, drying)?: A Little Help from another person to put on and taking off regular upper body clothing?: A Little Help from another person to put on and taking off regular lower body clothing?: A Little 6 Click Score: 17   End of Session Equipment Utilized  During Treatment: Gait belt;Rolling walker;Oxygen (4L) Nurse Communication: Mobility status;Other (comment) (encourage ambulation)  Activity Tolerance: Patient tolerated treatment well Patient left: in bed;with call bell/phone within reach;with bed alarm set  OT Visit Diagnosis: Unsteadiness on feet (R26.81);Muscle weakness (generalized) (M62.81);Pain Pain - part of body:  (general discomfort)                Time: 1027-2536 OT Time Calculation (min): 32 min Charges:  OT General Charges $OT Visit: 1 Visit OT Evaluation $OT Eval Moderate Complexity: 1 Mod OT Treatments $Self Care/Home Management : 8-22 mins  Maurie Boettcher, OT/L   Acute OT Clinical Specialist Acute Rehabilitation Services Pager [phone] Office [phone]   [facility] 12/26/2019, 5:40 PM

## 2019-12-26 NOTE — Plan of Care (Signed)

## 2019-12-27 LAB — CBC WITH DIFFERENTIAL/PLATELET
Abs Immature Granulocytes: 0.06 10*3/uL (ref 0.00–0.07)
Basophils Absolute: 0 10*3/uL (ref 0.0–0.1)
Basophils Relative: 0 %
Eosinophils Absolute: 0 10*3/uL (ref 0.0–0.5)
Eosinophils Relative: 0 %
HCT: 39.7 % (ref 36.0–46.0)
Hemoglobin: 13 g/dL (ref 12.0–15.0)
Immature Granulocytes: 1 %
Lymphocytes Relative: 4 %
Lymphs Abs: 0.3 10*3/uL — ABNORMAL LOW (ref 0.7–4.0)
MCH: 30 pg (ref 26.0–34.0)
MCHC: 32.7 g/dL (ref 30.0–36.0)
MCV: 91.7 fL (ref 80.0–100.0)
Monocytes Absolute: 0.3 10*3/uL (ref 0.1–1.0)
Monocytes Relative: 4 %
Neutro Abs: 6.8 10*3/uL (ref 1.7–7.7)
Neutrophils Relative %: 91 %
Platelets: 211 10*3/uL (ref 150–400)
RBC: 4.33 MIL/uL (ref 3.87–5.11)
RDW: 18.5 % — ABNORMAL HIGH (ref 11.5–15.5)
WBC: 7.5 10*3/uL (ref 4.0–10.5)
nRBC: 0 % (ref 0.0–0.2)

## 2019-12-27 LAB — COMPREHENSIVE METABOLIC PANEL
ALT: 110 U/L — ABNORMAL HIGH (ref 0–44)
AST: 104 U/L — ABNORMAL HIGH (ref 15–41)
Albumin: 2.5 g/dL — ABNORMAL LOW (ref 3.5–5.0)
Alkaline Phosphatase: 145 U/L — ABNORMAL HIGH (ref 38–126)
Anion gap: 15 (ref 5–15)
BUN: 41 mg/dL — ABNORMAL HIGH (ref 8–23)
CO2: 25 mmol/L (ref 22–32)
Calcium: 8 mg/dL — ABNORMAL LOW (ref 8.9–10.3)
Chloride: 102 mmol/L (ref 98–111)
Creatinine, Ser: 1.27 mg/dL — ABNORMAL HIGH (ref 0.44–1.00)
GFR calc non Af Amer: 37 mL/min — ABNORMAL LOW (ref >60)
Glucose, Bld: 204 mg/dL — ABNORMAL HIGH (ref 70–99)
Potassium: 3.6 mmol/L (ref 3.5–5.1)
Sodium: 142 mmol/L (ref 135–145)
Total Bilirubin: 0.7 mg/dL (ref 0.3–1.2)
Total Protein: 6 g/dL — ABNORMAL LOW (ref 6.5–8.1)

## 2019-12-27 LAB — D-DIMER, QUANTITATIVE: D-Dimer, Quant: 1.15 ug/mL-FEU — ABNORMAL HIGH (ref 0.00–0.50)

## 2019-12-27 LAB — BRAIN NATRIURETIC PEPTIDE: B Natriuretic Peptide: 1008.4 pg/mL — ABNORMAL HIGH (ref 0.0–100.0)

## 2019-12-27 LAB — SURGICAL PCR SCREEN
MRSA, PCR: POSITIVE — AB
Staphylococcus aureus: POSITIVE — AB

## 2019-12-27 LAB — C-REACTIVE PROTEIN: CRP: 4.2 mg/dL — ABNORMAL HIGH (ref <1.0)

## 2019-12-27 MED ORDER — APIXABAN 2.5 MG PO TABS
2.5000 mg | ORAL_TABLET | Freq: Two times a day (BID) | ORAL | Status: DC
  Administered 2019-12-28: 2.5 mg via ORAL
  Filled 2019-12-27: qty 1

## 2019-12-27 MED ORDER — DIGOXIN 0.25 MG/ML IJ SOLN
0.2500 mg | Freq: Once | INTRAMUSCULAR | Status: AC
  Administered 2019-12-27: 0.25 mg via INTRAVENOUS
  Filled 2019-12-27: qty 1

## 2019-12-27 MED ORDER — FUROSEMIDE 10 MG/ML IJ SOLN
20.0000 mg | Freq: Once | INTRAMUSCULAR | Status: AC
  Administered 2019-12-27: 20 mg via INTRAVENOUS
  Filled 2019-12-27: qty 2

## 2019-12-27 MED ORDER — POTASSIUM CHLORIDE CRYS ER 20 MEQ PO TBCR
40.0000 meq | EXTENDED_RELEASE_TABLET | Freq: Once | ORAL | Status: AC
  Administered 2019-12-27: 40 meq via ORAL
  Filled 2019-12-27: qty 2

## 2019-12-27 MED ORDER — ADULT MULTIVITAMIN W/MINERALS CH
1.0000 | ORAL_TABLET | Freq: Every day | ORAL | Status: DC
  Administered 2019-12-27 – 2020-01-01 (×6): 1 via ORAL
  Filled 2019-12-27 (×6): qty 1

## 2019-12-27 MED ORDER — PROSOURCE PLUS PO LIQD
30.0000 mL | Freq: Two times a day (BID) | ORAL | Status: DC
  Administered 2019-12-27 – 2020-01-01 (×11): 30 mL via ORAL
  Filled 2019-12-27 (×13): qty 30

## 2019-12-27 MED ORDER — ENSURE ENLIVE PO LIQD
237.0000 mL | Freq: Two times a day (BID) | ORAL | Status: DC
  Administered 2019-12-27 – 2020-01-01 (×12): 237 mL via ORAL

## 2019-12-27 NOTE — Progress Notes (Signed)
Pt's HR maintaining 130-140s. Provider Shalhoub made aware. Shalhoub said to notify him if pt's HR sustains above 140s or if pt becomes hypotensive or has respiratory distress.

## 2019-12-27 NOTE — Progress Notes (Signed)
PROGRESS NOTE                                                                                                                                                                                                             Patient Demographics:    Shirley Bates, is a 86 y.o. female, DOB - 1925-06-01, SAY:[id number]  Admit date - 12/24/2019   Admitting Physician Rise Patience, MD  Outpatient Primary MD for the patient is Kurth-Bowen, Cornelia, PA-C  LOS - 3  Chief Complaint  Patient presents with  . Covid Positive  . Cough  . Shortness of Breath       Brief Narrative  Shirley Bates is a 86 y.o. female with history of cardiomyopathy, A. fib, hypertension, severe dementia was found to be Covid positive yesterday.  As per the patient's daughter one of the residents at the living facility was tested positive last week and subsequently all the residents were being checked alternatingly every other day for Covid test.  Patient turned out to be Covid positive yesterday but was asymptomatic.  But since this morning patient was getting more febrile and was brought to the ER.   Subjective:    Mamie Hundertmark today appears to be slightly more confused, she had poor breakfast, healthcare cannot provide any complaints today.    Assessment  & Plan :    Acute respiratory failure with hypoxia secondary to COVID-19 infection  -He has improved oxygen requirement, this morning she is on 3 L of oxygen nasal cannula. -Continue with IV steroids . -Continue with IV remdesivir . -Borderline procalcitonin, possible bacterial pneumonia, continue with IV Unasyn. - due to her age she is tenuous, will encourage her to sit up in chair use I-S and flutter valve for pulmonary toiletry, advance activity and titrate down oxygen as much as we can.    Recent Labs  Lab 12/24/19 1737 12/24/19 1739 12/24/19 1839 12/25/19 0047 12/26/19 0339 12/26/19 0348 12/27/19 0423  WBC 4.0  --   --  3.1* 3.9*  --   7.5  CRP 6.1*  --   --  8.1* 6.0*  --  4.2*  DDIMER 1.18*  --   --  1.25* 1.05*  --  1.15*  BNP  --   --   --  843.9*  --  1,160.1* 1,008.4*  PROCALCITON 0.15  --   --   --   --   --   --   LATICACIDVEN  --  1.0  --   --   --   --   --  AST 214*  --   --  248* 143*  --  104*  ALT 100*  --   --  137* 115*  --  110*  ALKPHOS 166*  --   --  186* 150*  --  145*  BILITOT 0.8  --   --  1.0 0.4  --  0.7  ALBUMIN 2.0*  --   --  2.5* 2.1*  --  2.5*  SARSCOV2NAA  --   --  POSITIVE*  --   --   --   --     Markedly elevated LFTs  -Chronic liver disease/cirrhosis at baseline, as discussed with daughter, they are aware of this diagnosis, no further work-up at this point given her advanced age. -As well worsening LFTs in the setting of COVID-19 infection.  A. fib with RVR -Heart rate uncontrolled overnight, continue with beta-blockers, continue with amiodarone, will give one-time dose of digoxin . -She is on Eliquis for anticoagulation.  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   -Continue with home dose Lasix p.o. every other day, and she is receiving IV Lasix as needed, I will give 20 mg of IV Lasix today . - she is on ARB and prolonged.  Right Pleural  effusion -Most likely due to CHF/and liver cirrhosis, significant amount, will request thoracentesis by IR.  Hold Eliquis for today.  Severe dementia.  No acute issues.  Remains at risk for delirium.  Minimize narcotics and benzodiazepines.    Condition - Extremely Guarded  Family Communication  :  Daughter [phone] on 12/25/19,12/26/2019,12/27/2019 DNR, medical treatment.  If any decline focus more on comfort.  Code Status :  DNR  Consults  :  None  Procedures  :  None  PUD Prophylaxis : PPI  Disposition Plan  :    Status is: Inpatient  Remains inpatient appropriate because:IV treatments appropriate due to intensity of illness or inability to take PO   Dispo: The patient is from: SNF               Anticipated d/c is to: Home              Anticipated d/c date is: > 3 days              Patient currently is not medically stable to d/c.    DVT Prophylaxis  :  Eliquis  Lab Results  Component Value Date   PLT 211 12/27/2019    Diet :  Diet Order            DIET SOFT Room service appropriate? Yes; Fluid consistency: Thin; Fluid restriction: 1200 mL Fluid  Diet effective now                  Inpatient Medications Scheduled Meds: . (feeding supplement) PROSource Plus  30 mL Oral BID BM  . amiodarone  200 mg Oral Daily  . [START ON 12/28/2019] apixaban  2.5 mg Oral BID  . feeding supplement (ENSURE ENLIVE)  237 mL Oral BID BM  . ferrous sulfate  325 mg Oral Q breakfast  . furosemide  20 mg Oral Q M,W,F  . losartan  25 mg Oral Daily  . methylPREDNISolone (SOLU-MEDROL) injection  60 mg Intravenous Q12H  . metoprolol succinate  100 mg Oral Daily  . multivitamin with minerals  1 tablet Oral Daily  . mupirocin ointment  1 application Nasal BID  . pantoprazole  40 mg Oral  Daily  . simvastatin  20 mg Oral QHS   Continuous Infusions: . ampicillin-sulbactam (UNASYN) IV 3 g (12/27/19 0912)  . remdesivir 100 mg in NS 100 mL 100 mg (12/27/19 0836)   PRN Meds:.acetaminophen **OR** [DISCONTINUED] acetaminophen, chlorpheniramine-HYDROcodone, guaiFENesin-dextromethorphan, haloperidol lactate, [DISCONTINUED] ondansetron **OR** ondansetron (ZOFRAN) IV  Antibiotics  :   Anti-infectives (From admission, onward)   Start     Dose/Rate Route Frequency Ordered Stop   12/25/19 1015  Ampicillin-Sulbactam (UNASYN) 3 g in sodium chloride 0.9 % 100 mL IVPB        3 g 200 mL/hr over 30 Minutes Intravenous Every 12 hours 12/25/19 1008 12/30/19 1014   12/25/19 1000  remdesivir 100 mg in sodium chloride 0.9 % 100 mL IVPB       "Followed by" Linked Group Details   100 mg 200 mL/hr over 30 Minutes Intravenous Daily 12/24/19 2133 12/29/19 0959   12/25/19 0600  doxycycline (VIBRAMYCIN) 100 mg  in sodium chloride 0.9 % 250 mL IVPB  Status:  Discontinued        100 mg 125 mL/hr over 120 Minutes Intravenous Every 12 hours 12/25/19 0534 12/25/19 0957   12/24/19 2200  remdesivir 200 mg in sodium chloride 0.9% 250 mL IVPB       "Followed by" Linked Group Details   200 mg 580 mL/hr over 30 Minutes Intravenous Once 12/24/19 2133 12/25/19 0903          Objective:   Vitals:   12/26/19 2210 12/27/19 0344 12/27/19 0737 12/27/19 0739  BP: (!) 142/90  (!) 134/122 (!) 134/122  Pulse: (!) 104  93   Resp: 18  19 20   Temp: 97.9 F (36.6 C)  98.7 F (37.1 C) 98.7 F (37.1 C)  TempSrc:    Axillary  SpO2: 91%  91% 93%  Weight:  57.6 kg      SpO2: 93 % O2 Flow Rate (L/min): 5 L/min  Wt Readings from Last 3 Encounters:  12/27/19 57.6 kg  12/03/19 58.1 kg  11/19/19 56.7 kg     Intake/Output Summary (Last 24 hours) at 12/27/2019 1209 Last data filed at 12/27/2019 0836 Gross per 24 hour  Intake 420 ml  Output 460 ml  Net -40 ml     Physical Exam  Awake, she appears more confused and less interactive today Symmetrical Chest wall movement, next air entry at bilateral bases but right more than left Irregular irregular, tachycardic,No Gallops,Rubs or new Murmurs, No Parasternal Heave +ve B.Sounds, Abd Soft, No tenderness, No rebound - guarding or rigidity. No Cyanosis, Clubbing ,+1 edema, No new Rash or bruise      Pressure Injury 12/25/19 Coccyx Medial Stage 2 -  Partial thickness loss of dermis presenting as a shallow open injury with a red, pink wound bed without slough. (Active)  12/25/19 0100  Location: Coccyx  Location Orientation: Medial  Staging: Stage 2 -  Partial thickness loss of dermis presenting as a shallow open injury with a red, pink wound bed without slough.  Wound Description (Comments):   Present on Admission: Yes     Data Review:   Recent Labs  Lab 12/24/19 1737 12/25/19 0047 12/26/19 0339 12/27/19 0423  WBC 4.0 3.1* 3.9* 7.5  HGB 10.9* 11.0*  12.2 13.0  HCT 33.9* 35.2* 38.0 39.7  PLT 155 179 152 211  MCV 92.4 93.6 91.8 91.7  MCH 29.7 29.3 29.5 30.0  MCHC 32.2 31.3 32.1 32.7  RDW 18.6* 18.8* 18.5* 18.5*  LYMPHSABS 0.4* 0.1* 0.4* 0.3*  MONOABS 0.3 0.1 0.2 0.3  EOSABS 0.0 0.0 0.0 0.0  BASOSABS 0.0 0.0 0.0 0.0    Recent Labs  Lab 12/24/19 1737 12/24/19 1739 12/25/19 0047 12/25/19 0841 12/26/19 0339 12/26/19 0348 12/27/19 0423  NA 138  --  139  --  142  --  142  K 3.2*  --  4.6  --  3.8  --  3.6  CL 107  --  101  --  104  --  102  CO2 23  --  29  --  28  --  25  GLUCOSE 115*  --  146*  --  175*  --  204*  BUN 20  --  26*  --  37*  --  41*  CREATININE 0.78  --  0.95  --  0.90  --  1.27*  CALCIUM 7.0*  --  8.1*  --  7.9*  --  8.0*  AST 214*  --  248*  --  143*  --  104*  ALT 100*  --  137*  --  115*  --  110*  ALKPHOS 166*  --  186*  --  150*  --  145*  BILITOT 0.8  --  1.0  --  0.4  --  0.7  ALBUMIN 2.0*  --  2.5*  --  2.1*  --  2.5*  MG  --   --   --  2.1  --   --   --   CRP 6.1*  --  8.1*  --  6.0*  --  4.2*  DDIMER 1.18*  --  1.25*  --  1.05*  --  1.15*  PROCALCITON 0.15  --   --   --   --   --   --   LATICACIDVEN  --  1.0  --   --   --   --   --   BNP  --   --  843.9*  --   --  1,160.1* 1,008.4*    Recent Labs  Lab 12/24/19 1737 12/24/19 1839 12/25/19 0047 12/26/19 0339 12/26/19 0348 12/27/19 0423  CRP 6.1*  --  8.1* 6.0*  --  4.2*  DDIMER 1.18*  --  1.25* 1.05*  --  1.15*  BNP  --   --  843.9*  --  1,160.1* 1,008.4*  PROCALCITON 0.15  --   --   --   --   --   SARSCOV2NAA  --  POSITIVE*  --   --   --   --     ------------------------------------------------------------------------------------------------------------------ Recent Labs    12/24/19 1739  TRIG 40    Lab Results  Component Value Date   HGBA1C 5.8 (H) 04/30/2014   ------------------------------------------------------------------------------------------------------------------ No results for input(s): TSH, T4TOTAL, T3FREE,  THYROIDAB in the last 72 hours.  Invalid input(s): FREET3 ------------------------------------------------------------------------------------------------------------------ Recent Labs    12/24/19 1737  FERRITIN 484*    Coagulation profile No results for input(s): INR, PROTIME in the last 168 hours.  Recent Labs    12/26/19 0339 12/27/19 0423  DDIMER 1.05* 1.15*    Cardiac Enzymes No results for input(s): CKMB, TROPONINI, MYOGLOBIN in the last 168 hours.  Invalid input(s): CK ------------------------------------------------------------------------------------------------------------------    Component Value Date/Time   BNP 1,008.4 (H) 12/27/2019 0423    Micro Results Recent Results (from the past 240 hour(s))  Blood Culture (routine x 2)     Status: None (Preliminary result)   Collection Time: 12/24/19  5:37 PM   Specimen: BLOOD  LEFT ARM  Result Value Ref Range Status   Specimen Description BLOOD LEFT ARM  Final   Special Requests   Final    BOTTLES DRAWN AEROBIC AND ANAEROBIC Blood Culture adequate volume   Culture   Final    NO GROWTH 2 DAYS Performed at [facility], [address], [place], [place] [address]    Report Status PENDING  Incomplete  Blood Culture (routine x 2)     Status: None (Preliminary result)   Collection Time: 12/24/19  5:51 PM   Specimen: BLOOD RIGHT HAND  Result Value Ref Range Status   Specimen Description BLOOD RIGHT HAND  Final   Special Requests   Final    BOTTLES DRAWN AEROBIC AND ANAEROBIC Blood Culture adequate volume   Culture   Final    NO GROWTH 2 DAYS Performed at [facility], [place] [address] [place], [place] [address]    Report Status PENDING  Incomplete  Respiratory Panel by RT PCR (Flu A&B, Covid) - Nasopharyngeal Swab     Status: Abnormal   Collection Time: 12/24/19  6:39 PM   Specimen: Nasopharyngeal Swab  Result Value Ref Range Status   SARS Coronavirus 2 by RT PCR POSITIVE (A) NEGATIVE Final     Comment: RESULT CALLED TO, READ BACK BY AND VERIFIED WITH: Trudi Ida RN 2030 12/24/19 A BROWNING (NOTE) SARS-CoV-2 target nucleic acids are DETECTED.  SARS-CoV-2 RNA is generally detectable in upper respiratory specimens  during the acute phase of infection. Positive results are indicative of the presence of the identified virus, but do not rule out bacterial infection or co-infection with other pathogens not detected by the test. Clinical correlation with patient history and other diagnostic information is necessary to determine patient infection status. The expected result is Negative.  Fact Sheet for Patients:  PinkCheek.be  Fact Sheet for Healthcare Providers: GravelBags.it  This test is not yet approved or cleared by the [place] FDA and  has been authorized for detection and/or diagnosis of SARS-CoV-2 by FDA under an Emergency Use Authorization (EUA).  This EUA will remain in effect (meaning this test can be  used) for the duration of  the COVID-19 declaration under Section 564(b)(1) of the Act, 21 U.S.C. section 360bbb-3(b)(1), unless the authorization is terminated or revoked sooner.      Influenza A by PCR NEGATIVE NEGATIVE Final   Influenza B by PCR NEGATIVE NEGATIVE Final    Comment: (NOTE) The Xpert Xpress SARS-CoV-2/FLU/RSV assay is intended as an aid in  the diagnosis of influenza from Nasopharyngeal swab specimens and  should not be used as a sole basis for treatment. Nasal washings and  aspirates are unacceptable for Xpert Xpress SARS-CoV-2/FLU/RSV  testing.  Fact Sheet for Patients: PinkCheek.be  Fact Sheet for Healthcare Providers: GravelBags.it  This test is not yet approved or cleared by the [place] FDA and  has been authorized for detection and/or diagnosis of SARS-CoV-2 by  FDA under an Emergency Use Authorization (EUA). This  EUA will remain  in effect (meaning this test can be used) for the duration of the  Covid-19 declaration under Section 564(b)(1) of the Act, 21  U.S.C. section 360bbb-3(b)(1), unless the authorization is  terminated or revoked. Performed at [facility], [place] [address] [place], [place] [address]   MRSA PCR Screening     Status: Abnormal   Collection Time: 12/25/19 11:38 AM   Specimen: Nasal Mucosa; Nasopharyngeal  Result Value Ref Range Status  MRSA by PCR POSITIVE (A) NEGATIVE Final    Comment:        The GeneXpert MRSA Assay (FDA approved for NASAL specimens only), is one component of a comprehensive MRSA colonization surveillance program. It is not intended to diagnose MRSA infection nor to guide or monitor treatment for MRSA infections. RESULT CALLED TO, READ BACK BY AND VERIFIED WITH: Genella Rife RN 15:30 12/25/19 ([email or web address]) Performed at [facility], [place] [address] [place], [place] [address]   Surgical PCR screen     Status: Abnormal   Collection Time: 12/26/19 11:46 PM   Specimen: Nasal Mucosa; Nasal Swab  Result Value Ref Range Status   MRSA, PCR POSITIVE (A) NEGATIVE Final    Comment: RESULT CALLED TO, READ BACK BY AND VERIFIED WITH: [facility] RN 12/27/19 0531 JDW    Staphylococcus aureus POSITIVE (A) NEGATIVE Final    Comment: (NOTE) The Xpert SA Assay (FDA approved for NASAL specimens in patients 59 years of age and older), is one component of a comprehensive surveillance program. It is not intended to diagnose infection nor to guide or monitor treatment. Performed at [facility], [place] [address] [place], [place] [address]     Radiology Reports CT CHEST WO CONTRAST  Result Date: 12/26/2019 CLINICAL DATA:  COVID-19 positive patient with cough and congestion. EXAM: CT CHEST WITHOUT CONTRAST TECHNIQUE: Multidetector CT imaging of the chest was performed following the standard protocol without IV contrast. COMPARISON:  Single-view of the chest  12/24/2019. PA and lateral chest 11/18/2019. FINDINGS: Cardiovascular: Marked cardiomegaly. Calcific aortic and coronary atherosclerosis. No aneurysm. No pericardial effusion. Mediastinum/Nodes: No enlarged mediastinal or axillary lymph nodes. Thyroid gland, trachea, and esophagus demonstrate no significant findings. Lungs/Pleura: The patient has small to moderate pleural effusions, larger on the right. There is associated compressive basilar atelectasis. Right worse than left airspace disease is seen in the upper lobes bilaterally. Small focus of airspace opacity is also seen in the right middle lobe. Upper Abdomen: Trace amount of perihepatic ascites noted. Musculoskeletal: No acute or focal abnormality.  Scoliosis noted. IMPRESSION: Bilateral upper lobe and right middle lobe airspace disease has an appearance most consistent with pneumonia. Small to moderate pleural effusions, larger on the right. Cardiomegaly. Trace amount of perihepatic ascites. Aortic Atherosclerosis (ICD10-I70.0). Calcific coronary artery disease also noted Electronically Signed   By: Inge Rise M.D.   On: 12/26/2019 14:51   DG Chest Port 1 View  Result Date: 12/24/2019 CLINICAL DATA:  COVID-19 positivity with cough and chest congestion EXAM: PORTABLE CHEST 1 VIEW COMPARISON:  11/18/2018 FINDINGS: Cardiac shadow is enlarged but stable. Aortic calcifications are again seen. Patchy airspace opacity is noted in the right apex new from the prior exam consistent with the given clinical history. Patchy bibasilar opacities are again seen. No acute bony abnormality is noted. IMPRESSION: New right upper lobe airspace opacity consistent with the given clinical history. Electronically Signed   By: Inez Catalina M.D.   On: 12/24/2019 18:31   US Abdomen Limited RUQ  Result Date: 12/26/2019 CLINICAL DATA:  Transaminitis EXAM: ULTRASOUND ABDOMEN LIMITED RIGHT UPPER QUADRANT COMPARISON:  04/25/2019 FINDINGS: Gallbladder: There is pericholecystic  free fluid with mild gallbladder wall thickening with the gallbladder wall measuring approximately 3 mm. There are no gallstones. The sonographic Percell Miller sign is reported as negative. Common bile duct: Diameter: 3 mm Liver: The liver parenchyma is coarsened and heterogeneous. Liver surface appears somewhat nodular. Portal vein is patent on color Doppler imaging with normal direction of  blood flow towards the liver. Other: There is a large right-sided pleural effusion. There is a small volume of ascites in the upper abdomen. IMPRESSION: 1. Gallbladder wall thickening without cholelithiasis or definite sonographic evidence for acute cholecystitis. Gallbladder wall thickening is a nonspecific finding and can be seen in patients with ascites or heart failure. 2. Incidentally noted large right-sided pleural effusion. 3. Small volume ascites. 4. Again noted is a coarsened and heterogeneous appearance of the liver parenchyma consistent with underlying hepatocellular disease. Electronically Signed   By: Constance Holster M.D.   On: 12/26/2019 01:45    Phillips Climes M.D on 12/27/2019 at 12:09 PM  To page go to [email or web address]

## 2019-12-27 NOTE — Plan of Care (Signed)
  Problem: Clinical Measurements: Goal: Ability to maintain clinical measurements within normal limits will improve Outcome: Progressing Goal: Will remain free from infection Outcome: Progressing Goal: Diagnostic test results will improve Outcome: Progressing Goal: Respiratory complications will improve Outcome: Progressing Goal: Cardiovascular complication will be avoided Outcome: Progressing   Problem: Activity: Goal: Risk for activity intolerance will decrease Outcome: Progressing   Problem: Elimination: Goal: Will not experience complications related to bowel motility Outcome: Progressing Goal: Will not experience complications related to urinary retention Outcome: Progressing   Problem: Pain Managment: Goal: General experience of comfort will improve Outcome: Progressing   Problem: Safety: Goal: Ability to remain free from injury will improve Outcome: Progressing   Problem: Skin Integrity: Goal: Risk for impaired skin integrity will decrease Outcome: Progressing   Problem: Nutrition: Goal: Adequate nutrition will be maintained Outcome: Not Progressing   Problem: Coping: Goal: Level of anxiety will decrease Outcome: Not Progressing   Problem: Education: Goal: Knowledge of General Education information will improve Description: Including pain rating scale, medication(s)/side effects and non-pharmacologic comfort measures Outcome: Not Met (add Reason)   Problem: Health Behavior/Discharge Planning: Goal: Ability to manage health-related needs will improve Outcome: Not Met (add Reason)

## 2019-12-27 NOTE — Progress Notes (Signed)
CRITICAL VALUE ALERT  Critical Value:  MRSA PCR   Date & Time Notied:  12/27/19 @ 0600  Provider Notified: n/a  Orders Received/Actions taken: patient in contact isolation

## 2019-12-27 NOTE — Plan of Care (Signed)

## 2019-12-27 NOTE — Progress Notes (Signed)
Initial Nutrition Assessment  DOCUMENTATION CODES:   Not applicable  INTERVENTION:  -Magic cup TID with meals, each supplement provides 290 kcal and 9 grams of protein -44ml Prosource PLUS po BID, each supplement provides 100 kcal and 15 grams of protein -MVI daily -Continue Ensure Enlive po BID, each supplement provides 350 kcal and 20 grams of protein   NUTRITION DIAGNOSIS:   Increased nutrient needs related to wound healing, catabolic illness ([id number]) as evidenced by estimated needs.    GOAL:   Patient will meet greater than or equal to 90% of their needs    MONITOR:   PO intake, Supplement acceptance, Labs, Weight trends, I & O's, Skin  REASON FOR ASSESSMENT:   Malnutrition Screening Tool    ASSESSMENT:   Pt from memory unit admitted for acute respiratory failure with hypoxia 2/2 COVID-19 infection (diagnosed 10/6). PMH includes severe dementia, HTN, CHF, A.fib, and cardiomyopathy.  Pt noted to have markedly elevated LFTs, likely in the setting of COVID-19 PNA. Pt also with R pleural effusion, unsure if thoracentesis will be required at this time.   Pt is pleasantly confused and denies any N/V. Limited meal documentation available for pt -- 50% meal consumption x 1 recorded meal. Pt with orders for Ensure Enlive BID. Pt has only received this supplement once so far; unable to determine if this is an effective intervention at this time. Will order additional supplements in case pt does not consume Ensure Enlive well.   Per wt readings, pt has had a 7.29% wt gain over the last 3 months, which is insignificant for time frame. Pt is noted to have +2 moderate pitting edema to BLE. Unsure how much fluid retention is contributing to wt gain at this time.   Labs reviewed. Medications: lasix, ferrous sulfate, solu-medrol, protonix   Diet Order:   Diet Order            DIET SOFT Room service appropriate? Yes; Fluid consistency: Thin; Fluid restriction: 1200 mL Fluid   Diet effective now                 EDUCATION NEEDS:   No education needs have been identified at this time  Skin:  Skin Assessment: Skin Integrity Issues: Skin Integrity Issues:: Stage II Stage II: coccyx  Last BM:  PTA  Height:   Ht Readings from Last 1 Encounters:  12/03/19 5\' 2"  (1.575 m)    Weight:   Wt Readings from Last 5 Encounters:  12/27/19 57.6 kg  12/03/19 58.1 kg  11/19/19 56.7 kg  11/08/19 56.2 kg  10/17/19 53.8 kg    BMI:  Body mass index is 23.23 kg/m.  Estimated Nutritional Needs:   Kcal:  1500-1700  Protein:  80-95 grams  Fluid:  >/= 1.5L/d    Larkin Ina, MS, RD, LDN RD pager number and weekend/on-call pager number located in [place].

## 2019-12-28 ENCOUNTER — Inpatient Hospital Stay (HOSPITAL_COMMUNITY): Payer: Medicare Other

## 2019-12-28 DIAGNOSIS — U071 COVID-19: Principal | ICD-10-CM

## 2019-12-28 DIAGNOSIS — J9601 Acute respiratory failure with hypoxia: Secondary | ICD-10-CM

## 2019-12-28 DIAGNOSIS — J069 Acute upper respiratory infection, unspecified: Secondary | ICD-10-CM

## 2019-12-28 DIAGNOSIS — J90 Pleural effusion, not elsewhere classified: Secondary | ICD-10-CM

## 2019-12-28 LAB — CBC WITH DIFFERENTIAL/PLATELET
Abs Immature Granulocytes: 0.06 10*3/uL (ref 0.00–0.07)
Basophils Absolute: 0 10*3/uL (ref 0.0–0.1)
Basophils Relative: 0 %
Eosinophils Absolute: 0 10*3/uL (ref 0.0–0.5)
Eosinophils Relative: 0 %
HCT: 40.5 % (ref 36.0–46.0)
Hemoglobin: 13.2 g/dL (ref 12.0–15.0)
Immature Granulocytes: 1 %
Lymphocytes Relative: 3 %
Lymphs Abs: 0.3 10*3/uL — ABNORMAL LOW (ref 0.7–4.0)
MCH: 30 pg (ref 26.0–34.0)
MCHC: 32.6 g/dL (ref 30.0–36.0)
MCV: 92 fL (ref 80.0–100.0)
Monocytes Absolute: 0.4 10*3/uL (ref 0.1–1.0)
Monocytes Relative: 4 %
Neutro Abs: 8.8 10*3/uL — ABNORMAL HIGH (ref 1.7–7.7)
Neutrophils Relative %: 92 %
Platelets: 220 10*3/uL (ref 150–400)
RBC: 4.4 MIL/uL (ref 3.87–5.11)
RDW: 18.4 % — ABNORMAL HIGH (ref 11.5–15.5)
WBC: 9.5 10*3/uL (ref 4.0–10.5)
nRBC: 0 % (ref 0.0–0.2)

## 2019-12-28 LAB — COMPREHENSIVE METABOLIC PANEL
ALT: 97 U/L — ABNORMAL HIGH (ref 0–44)
AST: 76 U/L — ABNORMAL HIGH (ref 15–41)
Albumin: 2.4 g/dL — ABNORMAL LOW (ref 3.5–5.0)
Alkaline Phosphatase: 139 U/L — ABNORMAL HIGH (ref 38–126)
Anion gap: 12 (ref 5–15)
BUN: 42 mg/dL — ABNORMAL HIGH (ref 8–23)
CO2: 30 mmol/L (ref 22–32)
Calcium: 8.3 mg/dL — ABNORMAL LOW (ref 8.9–10.3)
Chloride: 103 mmol/L (ref 98–111)
Creatinine, Ser: 0.92 mg/dL (ref 0.44–1.00)
GFR calc non Af Amer: 54 mL/min — ABNORMAL LOW (ref >60)
Glucose, Bld: 192 mg/dL — ABNORMAL HIGH (ref 70–99)
Potassium: 3.5 mmol/L (ref 3.5–5.1)
Sodium: 145 mmol/L (ref 135–145)
Total Bilirubin: 0.7 mg/dL (ref 0.3–1.2)
Total Protein: 5.8 g/dL — ABNORMAL LOW (ref 6.5–8.1)

## 2019-12-28 LAB — D-DIMER, QUANTITATIVE: D-Dimer, Quant: 1.37 ug/mL-FEU — ABNORMAL HIGH (ref 0.00–0.50)

## 2019-12-28 LAB — BRAIN NATRIURETIC PEPTIDE: B Natriuretic Peptide: 1579.5 pg/mL — ABNORMAL HIGH (ref 0.0–100.0)

## 2019-12-28 LAB — C-REACTIVE PROTEIN: CRP: 3.4 mg/dL — ABNORMAL HIGH (ref <1.0)

## 2019-12-28 MED ORDER — QUETIAPINE FUMARATE 25 MG PO TABS
25.0000 mg | ORAL_TABLET | Freq: Every day | ORAL | Status: DC
  Administered 2019-12-28 – 2019-12-30 (×3): 25 mg via ORAL
  Filled 2019-12-28 (×3): qty 1

## 2019-12-28 MED ORDER — POTASSIUM CHLORIDE CRYS ER 20 MEQ PO TBCR
30.0000 meq | EXTENDED_RELEASE_TABLET | Freq: Four times a day (QID) | ORAL | Status: AC
  Administered 2019-12-28 (×2): 30 meq via ORAL
  Filled 2019-12-28 (×2): qty 1

## 2019-12-28 MED ORDER — FUROSEMIDE 10 MG/ML IJ SOLN
40.0000 mg | Freq: Once | INTRAMUSCULAR | Status: AC
  Administered 2019-12-28: 40 mg via INTRAVENOUS
  Filled 2019-12-28: qty 4

## 2019-12-28 MED ORDER — LIDOCAINE HCL 1 % IJ SOLN
INTRAMUSCULAR | Status: AC
  Filled 2019-12-28: qty 20

## 2019-12-28 NOTE — TOC Progression Note (Addendum)
Transition of Care [facility]) - Progression Note    Patient Details  Name: Shirley Bates MRN: [id number] Date of Birth: 03-May-1925  Transition of Care [facility]) CM/SW [place], RN Phone Number: [phone]  12/28/2019, 1:31 PM  Clinical Narrative:    Consult received from MD to initiate bed search for SNF. CM called daughter [name] to discuss the possibility of SNF for rehab and the daughter is agreeable to bed search stating that she realizes her mom need to get stronger. Daughter requested that facility list be faxed to her. CM explained to daughter that list would be limited due to her mothers covid status. List has been faxed to daughter. Daughter gives verbal consent for CM to initiate bed search.  1510 FL2 completed and patient info has been faxed out via the hub. 1615 PASRR submitted but remains pending  Expected Discharge Plan: [place] Barriers to Discharge: Continued Medical Work up  Expected Discharge Plan and Services Expected Discharge Plan: [place] In-house Referral: NA Discharge Planning Services: CM Consult Post Acute Care Choice: [place] Living arrangements for the past 2 months: Assisted Living Facility                 DME Arranged: N/A DME Agency: NA       HH Arranged: NA HH Agency: NA         Social Determinants of Health (SDOH) Interventions    Readmission Risk Interventions Readmission Risk Prevention Plan 12/26/2019  Transportation Screening Complete  PCP or Specialist Appt within 3-5 Days Complete  HRI or Home Care Consult Complete  Social Work Consult for [place] Planning/Counseling Complete  Palliative Care Screening Not Applicable  Medication Review Press photographer) Referral to Pharmacy  Some recent data might be hidden

## 2019-12-28 NOTE — Progress Notes (Signed)
Request to IR for diagnostic and therapeutic thoracentesis. According to floor administration IR APPs do not have privileges which specifically state that we are able to do bedside thoracentesis.   Given patient's COVID (+) status we are unable to bring patient to department for procedure as we do not have an area to accommodate her needs nor a room which can be shut down for several hours post procedure for the required sanitation.   Unfortunately, at this time we are not able to perform this procedure - recommend contacting critical care/pulmonology if this procedure is still needed. Primary team aware.  Candiss Norse, PA-C

## 2019-12-28 NOTE — Progress Notes (Signed)
PROGRESS NOTE                                                                                                                                                                                                             Patient Demographics:    Shirley Bates, is a 86 y.o. female, DOB - [birth year], HBZ:[id number]  Admit date - 12/24/2019   Admitting Physician Rise Patience, MD  Outpatient Primary MD for the patient is Kurth-Bowen, Cornelia, PA-C  LOS - 4  Chief Complaint  Patient presents with  . Covid Positive  . Cough  . Shortness of Breath       Brief Narrative    Shirley Bates is a 86 y.o. female with history of cardiomyopathy, A. fib, hypertension, severe dementia was found to be Covid positive yesterday.  As per the patient's daughter one of the residents at the living facility was tested positive last week and subsequently all the residents were being checked alternatingly every other day for Covid test.  Patient turned out to be Covid positive yesterday but was asymptomatic.  But since this morning patient was getting more febrile and was brought to the ER.   Subjective:    Shirley Bates today with some confusion and delirium overnight where she required Haldol, she was able to get out of bed to chair during the day yesterday.  .    Assessment  & Plan :    Acute respiratory failure with hypoxia secondary to COVID-19 infection  -He has improved oxygen requirement, this morning she is on 3 L of oxygen nasal cannula. -Continue with IV steroids . -Continue with IV remdesivir . -Borderline procalcitonin, possible bacterial pneumonia, continue with IV Unasyn. - due to her age she is tenuous, will encourage her to sit up in chair use I-S and flutter valve for pulmonary toiletry, advance activity and titrate down oxygen as much as we can.    Recent Labs  Lab 12/24/19 1737 12/24/19 1739 12/24/19 1839 12/25/19 0047 12/26/19 0339 12/26/19 0348 12/27/19 0423  12/28/19 0300  WBC 4.0  --   --  3.1* 3.9*  --  7.5 9.5  CRP 6.1*  --   --  8.1* 6.0*  --  4.2* 3.4*  DDIMER 1.18*  --   --  1.25* 1.05*  --  1.15* 1.37*  BNP  --   --   --  843.9*  --  1,160.1* 1,008.4* 1,579.5*  PROCALCITON 0.15  --   --   --   --   --   --   --  LATICACIDVEN  --  1.0  --   --   --   --   --   --   AST 214*  --   --  248* 143*  --  104* 76*  ALT 100*  --   --  137* 115*  --  110* 97*  ALKPHOS 166*  --   --  186* 150*  --  145* 139*  BILITOT 0.8  --   --  1.0 0.4  --  0.7 0.7  ALBUMIN 2.0*  --   --  2.5* 2.1*  --  2.5* 2.4*  SARSCOV2NAA  --   --  POSITIVE*  --   --   --   --   --     Markedly elevated LFTs  -Secondary to COVID-19 pneumonia, with known underlining chronic liver disease/cirrhosis at baseline, as discussed with daughter, they are aware of this diagnosis, decision was made for no further work-up at this point given her advanced age. -Trending down, continue to monitor closely  A. fib with RVR -Heart rate uncontrolled overnight, continue with beta-blockers, continue with amiodarone, she required IV digoxin x1. -She is on Eliquis for anticoagulation.  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   -Continue with home dose Lasix p.o. every other day, she has been receiving IV Lasix as needed almost on a daily basis, given soft blood pressure today I will hold on Lasix . -We will hold her ARB given worsening creatinine .  AKI -Creatinine is up to 1.27 yesterday, have stopped losartan and holding Lasix for now  Right Pleural  effusion -Most likely due to CHF/and liver cirrhosis, significant amount, will request thoracentesis by IR.  Hold Eliquis for today.  Severe dementia.  No acute issues.   Hospital delirium -Patient with significant hospital delirium, requiring frequent Haldol dosing, I will stop it and start her on Seroquel at bedtime.    Condition - Extremely Guarded  Family Communication  :  Daughter  [phone] updated daily. Code Status :  DNR  Consults  :  None  Procedures  :  None  PUD Prophylaxis : PPI  Disposition Plan  :    Status is: Inpatient  Remains inpatient appropriate because:IV treatments appropriate due to intensity of illness or inability to take PO   Dispo: The patient is from: SNF              Anticipated d/c is to: Home              Anticipated d/c date is: > 3 days              Patient currently is not medically stable to d/c.    DVT Prophylaxis  :  Eliquis  Lab Results  Component Value Date   PLT 220 12/28/2019    Diet :  Diet Order            DIET SOFT Room service appropriate? Yes; Fluid consistency: Thin; Fluid restriction: 1200 mL Fluid  Diet effective now                  Inpatient Medications Scheduled Meds: . (feeding supplement) PROSource Plus  30 mL Oral BID BM  . amiodarone  200 mg Oral Daily  . apixaban  2.5 mg Oral BID  . feeding supplement (ENSURE ENLIVE)  237 mL Oral BID BM  . ferrous sulfate  325 mg Oral Q breakfast  . furosemide  20 mg Oral Q M,W,F  . methylPREDNISolone (SOLU-MEDROL) injection  60 mg Intravenous Q12H  . metoprolol succinate  100 mg Oral Daily  . multivitamin with minerals  1 tablet Oral Daily  . mupirocin ointment  1 application Nasal BID  . pantoprazole  40 mg Oral Daily  . QUEtiapine  25 mg Oral QHS  . simvastatin  20 mg Oral QHS   Continuous Infusions: . ampicillin-sulbactam (UNASYN) IV 3 g (12/28/19 1029)   PRN Meds:.acetaminophen **OR** [DISCONTINUED] acetaminophen, chlorpheniramine-HYDROcodone, guaiFENesin-dextromethorphan, [DISCONTINUED] ondansetron **OR** ondansetron (ZOFRAN) IV  Antibiotics  :   Anti-infectives (From admission, onward)   Start     Dose/Rate Route Frequency Ordered Stop   12/25/19 1015  Ampicillin-Sulbactam (UNASYN) 3 g in sodium chloride 0.9 % 100 mL IVPB        3 g 200 mL/hr over 30 Minutes Intravenous Every 12 hours 12/25/19 1008 12/30/19 1014   12/25/19 1000   remdesivir 100 mg in sodium chloride 0.9 % 100 mL IVPB       "Followed by" Linked Group Details   100 mg 200 mL/hr over 30 Minutes Intravenous Daily 12/24/19 2133 12/28/19 0956   12/25/19 0600  doxycycline (VIBRAMYCIN) 100 mg in sodium chloride 0.9 % 250 mL IVPB  Status:  Discontinued        100 mg 125 mL/hr over 120 Minutes Intravenous Every 12 hours 12/25/19 0534 12/25/19 0957   12/24/19 2200  remdesivir 200 mg in sodium chloride 0.9% 250 mL IVPB       "Followed by" Linked Group Details   200 mg 580 mL/hr over 30 Minutes Intravenous Once 12/24/19 2133 12/25/19 0903          Objective:   Vitals:   12/27/19 2319 12/28/19 0247 12/28/19 0559 12/28/19 0749  BP: (!) 110/97   (!) 123/111  Pulse: 86   65  Resp: 20   19  Temp: 98.5 F (36.9 C)   98.7 F (37.1 C)  TempSrc: Axillary     SpO2: 95%   94%  Weight:  56.2 kg 56.9 kg     SpO2: 94 % O2 Flow Rate (L/min): 5 L/min  Wt Readings from Last 3 Encounters:  12/28/19 56.9 kg  12/03/19 58.1 kg  11/19/19 56.7 kg     Intake/Output Summary (Last 24 hours) at 12/28/2019 1150 Last data filed at 12/27/2019 2249 Gross per 24 hour  Intake 434.96 ml  Output 450 ml  Net -15.04 ml     Physical Exam  Patient is more awake today, he remains confused, demented at baseline Symmetrical Chest wall movement, diminished air entry bilaterally, right more than left at the bases, no wheezing Irregular irregular, no rubs or gallops Abdomen soft Extremties with mild edema, no cyanosis   Pressure Injury 12/25/19 Coccyx Medial Stage 2 -  Partial thickness loss of dermis presenting as a shallow open injury with a red, pink wound bed without slough. (Active)  12/25/19 0100  Location: Coccyx  Location Orientation: Medial  Staging: Stage 2 -  Partial thickness loss of dermis presenting as a shallow open injury with a red, pink wound bed without slough.  Wound Description (Comments):   Present on Admission: Yes     Data Review:   Recent  Labs  Lab 12/24/19 1737 12/25/19 0047 12/26/19 0339 12/27/19 0423 12/28/19 0300  WBC 4.0 3.1* 3.9* 7.5 9.5  HGB 10.9* 11.0* 12.2 13.0 13.2  HCT 33.9* 35.2* 38.0 39.7 40.5  PLT 155 179 152 211 220  MCV  92.4 93.6 91.8 91.7 92.0  MCH 29.7 29.3 29.5 30.0 30.0  MCHC 32.2 31.3 32.1 32.7 32.6  RDW 18.6* 18.8* 18.5* 18.5* 18.4*  LYMPHSABS 0.4* 0.1* 0.4* 0.3* 0.3*  MONOABS 0.3 0.1 0.2 0.3 0.4  EOSABS 0.0 0.0 0.0 0.0 0.0  BASOSABS 0.0 0.0 0.0 0.0 0.0    Recent Labs  Lab 12/24/19 1737 12/24/19 1739 12/25/19 0047 12/25/19 0841 12/26/19 0339 12/26/19 0348 12/27/19 0423 12/28/19 0300  NA 138  --  139  --  142  --  142 145  K 3.2*  --  4.6  --  3.8  --  3.6 3.5  CL 107  --  101  --  104  --  102 103  CO2 23  --  29  --  28  --  25 30  GLUCOSE 115*  --  146*  --  175*  --  204* 192*  BUN 20  --  26*  --  37*  --  41* 42*  CREATININE 0.78  --  0.95  --  0.90  --  1.27* 0.92  CALCIUM 7.0*  --  8.1*  --  7.9*  --  8.0* 8.3*  AST 214*  --  248*  --  143*  --  104* 76*  ALT 100*  --  137*  --  115*  --  110* 97*  ALKPHOS 166*  --  186*  --  150*  --  145* 139*  BILITOT 0.8  --  1.0  --  0.4  --  0.7 0.7  ALBUMIN 2.0*  --  2.5*  --  2.1*  --  2.5* 2.4*  MG  --   --   --  2.1  --   --   --   --   CRP 6.1*  --  8.1*  --  6.0*  --  4.2* 3.4*  DDIMER 1.18*  --  1.25*  --  1.05*  --  1.15* 1.37*  PROCALCITON 0.15  --   --   --   --   --   --   --   LATICACIDVEN  --  1.0  --   --   --   --   --   --   BNP  --   --  843.9*  --   --  1,160.1* 1,008.4* 1,579.5*    Recent Labs  Lab 12/24/19 1737 12/24/19 1839 12/25/19 0047 12/26/19 0339 12/26/19 0348 12/27/19 0423 12/28/19 0300  CRP 6.1*  --  8.1* 6.0*  --  4.2* 3.4*  DDIMER 1.18*  --  1.25* 1.05*  --  1.15* 1.37*  BNP  --   --  843.9*  --  1,160.1* 1,008.4* 1,579.5*  PROCALCITON 0.15  --   --   --   --   --   --   SARSCOV2NAA  --  POSITIVE*  --   --   --   --   --       ------------------------------------------------------------------------------------------------------------------ No results for input(s): CHOL, HDL, LDLCALC, TRIG, CHOLHDL, LDLDIRECT in the last 72 hours.  Lab Results  Component Value Date   HGBA1C 5.8 (H) 04/30/2014   ------------------------------------------------------------------------------------------------------------------ No results for input(s): TSH, T4TOTAL, T3FREE, THYROIDAB in the last 72 hours.  Invalid input(s): FREET3 ------------------------------------------------------------------------------------------------------------------ No results for input(s): VITAMINB12, FOLATE, FERRITIN, TIBC, IRON, RETICCTPCT in the last 72 hours.  Coagulation profile No results for input(s): INR, PROTIME in the last 168 hours.  Recent Labs  12/27/19 0423 12/28/19 0300  DDIMER 1.15* 1.37*    Cardiac Enzymes No results for input(s): CKMB, TROPONINI, MYOGLOBIN in the last 168 hours.  Invalid input(s): CK ------------------------------------------------------------------------------------------------------------------    Component Value Date/Time   BNP 1,579.5 (H) 12/28/2019 0300    Micro Results Recent Results (from the past 240 hour(s))  Blood Culture (routine x 2)     Status: None (Preliminary result)   Collection Time: 12/24/19  5:37 PM   Specimen: BLOOD LEFT ARM  Result Value Ref Range Status   Specimen Description BLOOD LEFT ARM  Final   Special Requests   Final    BOTTLES DRAWN AEROBIC AND ANAEROBIC Blood Culture adequate volume   Culture   Final    NO GROWTH 4 DAYS Performed at [facility], [place] [address], [place], [place] [address]    Report Status PENDING  Incomplete  Blood Culture (routine x 2)     Status: None (Preliminary result)   Collection Time: 12/24/19  5:51 PM   Specimen: BLOOD RIGHT HAND  Result Value Ref Range Status   Specimen Description BLOOD RIGHT HAND  Final   Special Requests    Final    BOTTLES DRAWN AEROBIC AND ANAEROBIC Blood Culture adequate volume   Culture   Final    NO GROWTH 4 DAYS Performed at [facility], [place] [address] [place], [place] [address]    Report Status PENDING  Incomplete  Respiratory Panel by RT PCR (Flu A&B, Covid) - Nasopharyngeal Swab     Status: Abnormal   Collection Time: 12/24/19  6:39 PM   Specimen: Nasopharyngeal Swab  Result Value Ref Range Status   SARS Coronavirus 2 by RT PCR POSITIVE (A) NEGATIVE Final    Comment: RESULT CALLED TO, READ BACK BY AND VERIFIED WITH: Trudi Ida RN 2030 12/24/19 A BROWNING (NOTE) SARS-CoV-2 target nucleic acids are DETECTED.  SARS-CoV-2 RNA is generally detectable in upper respiratory specimens  during the acute phase of infection. Positive results are indicative of the presence of the identified virus, but do not rule out bacterial infection or co-infection with other pathogens not detected by the test. Clinical correlation with patient history and other diagnostic information is necessary to determine patient infection status. The expected result is Negative.  Fact Sheet for Patients:  PinkCheek.be  Fact Sheet for Healthcare Providers: GravelBags.it  This test is not yet approved or cleared by the [place] FDA and  has been authorized for detection and/or diagnosis of SARS-CoV-2 by FDA under an Emergency Use Authorization (EUA).  This EUA will remain in effect (meaning this test can be  used) for the duration of  the COVID-19 declaration under Section 564(b)(1) of the Act, 21 U.S.C. section 360bbb-3(b)(1), unless the authorization is terminated or revoked sooner.      Influenza A by PCR NEGATIVE NEGATIVE Final   Influenza B by PCR NEGATIVE NEGATIVE Final    Comment: (NOTE) The Xpert Xpress SARS-CoV-2/FLU/RSV assay is intended as an aid in  the diagnosis of influenza from Nasopharyngeal swab specimens and  should not  be used as a sole basis for treatment. Nasal washings and  aspirates are unacceptable for Xpert Xpress SARS-CoV-2/FLU/RSV  testing.  Fact Sheet for Patients: PinkCheek.be  Fact Sheet for Healthcare Providers: GravelBags.it  This test is not yet approved or cleared by the [place] FDA and  has been authorized for detection and/or diagnosis of SARS-CoV-2 by  FDA under an Emergency Use Authorization (EUA). This EUA will remain  in effect (meaning this test can be used) for the duration of the  Covid-19 declaration under Section 564(b)(1) of the Act, 21  U.S.C. section 360bbb-3(b)(1), unless the authorization is  terminated or revoked. Performed at [facility], [place] [address] [place], [place] [address]   MRSA PCR Screening     Status: Abnormal   Collection Time: 12/25/19 11:38 AM   Specimen: Nasal Mucosa; Nasopharyngeal  Result Value Ref Range Status   MRSA by PCR POSITIVE (A) NEGATIVE Final    Comment:        The GeneXpert MRSA Assay (FDA approved for NASAL specimens only), is one component of a comprehensive MRSA colonization surveillance program. It is not intended to diagnose MRSA infection nor to guide or monitor treatment for MRSA infections. RESULT CALLED TO, READ BACK BY AND VERIFIED WITH: Genella Rife RN 15:30 12/25/19 ([email or web address]) Performed at [facility], [place] [address], [place],  [place] [address]   Surgical PCR screen     Status: Abnormal   Collection Time: 12/26/19 11:46 PM   Specimen: Nasal Mucosa; Nasal Swab  Result Value Ref Range Status   MRSA, PCR POSITIVE (A) NEGATIVE Final    Comment: RESULT CALLED TO, READ BACK BY AND VERIFIED WITH: M [facility] RN 12/27/19 0531 JDW    Staphylococcus aureus POSITIVE (A) NEGATIVE Final    Comment: (NOTE) The Xpert SA Assay (FDA approved for NASAL specimens in patients 54 years of age and older), is one component of a comprehensive surveillance program. It  is not intended to diagnose infection nor to guide or monitor treatment. Performed at [facility], [place] [address] [place], [place] [address]     Radiology Reports CT CHEST WO CONTRAST  Result Date: 12/26/2019 CLINICAL DATA:  COVID-19 positive patient with cough and congestion. EXAM: CT CHEST WITHOUT CONTRAST TECHNIQUE: Multidetector CT imaging of the chest was performed following the standard protocol without IV contrast. COMPARISON:  Single-view of the chest 12/24/2019. PA and lateral chest 11/18/2019. FINDINGS: Cardiovascular: Marked cardiomegaly. Calcific aortic and coronary atherosclerosis. No aneurysm. No pericardial effusion. Mediastinum/Nodes: No enlarged mediastinal or axillary lymph nodes. Thyroid gland, trachea, and esophagus demonstrate no significant findings. Lungs/Pleura: The patient has small to moderate pleural effusions, larger on the right. There is associated compressive basilar atelectasis. Right worse than left airspace disease is seen in the upper lobes bilaterally. Small focus of airspace opacity is also seen in the right middle lobe. Upper Abdomen: Trace amount of perihepatic ascites noted. Musculoskeletal: No acute or focal abnormality.  Scoliosis noted. IMPRESSION: Bilateral upper lobe and right middle lobe airspace disease has an appearance most consistent with pneumonia. Small to moderate pleural effusions, larger on the right. Cardiomegaly. Trace amount of perihepatic ascites. Aortic Atherosclerosis (ICD10-I70.0). Calcific coronary artery disease also noted Electronically Signed   By: Inge Rise M.D.   On: 12/26/2019 14:51   DG Chest Port 1 View  Result Date: 12/24/2019 CLINICAL DATA:  COVID-19 positivity with cough and chest congestion EXAM: PORTABLE CHEST 1 VIEW COMPARISON:  11/18/2018 FINDINGS: Cardiac shadow is enlarged but stable. Aortic calcifications are again seen. Patchy airspace opacity is noted in the right apex new from the prior exam consistent  with the given clinical history. Patchy bibasilar opacities are again seen. No acute bony abnormality is noted. IMPRESSION: New right upper lobe airspace opacity consistent with the given clinical history. Electronically Signed   By: Inez Catalina M.D.   On: 12/24/2019 18:31   US Abdomen Limited RUQ  Result Date: 12/26/2019 CLINICAL DATA:  Transaminitis EXAM: ULTRASOUND ABDOMEN LIMITED RIGHT UPPER QUADRANT COMPARISON:  04/25/2019 FINDINGS: Gallbladder: There is pericholecystic free fluid with mild gallbladder wall thickening with the gallbladder wall measuring approximately 3 mm. There are no gallstones. The sonographic Percell Miller sign is reported as negative. Common bile duct: Diameter: 3 mm Liver: The liver parenchyma is coarsened and heterogeneous. Liver surface appears somewhat nodular. Portal vein is patent on color Doppler imaging with normal direction of blood flow towards the liver. Other: There is a large right-sided pleural effusion. There is a small volume of ascites in the upper abdomen. IMPRESSION: 1. Gallbladder wall thickening without cholelithiasis or definite sonographic evidence for acute cholecystitis. Gallbladder wall thickening is a nonspecific finding and can be seen in patients with ascites or heart failure. 2. Incidentally noted large right-sided pleural effusion. 3. Small volume ascites. 4. Again noted is a coarsened and heterogeneous appearance of the liver parenchyma consistent with underlying hepatocellular disease. Electronically Signed   By: Constance Holster M.D.   On: 12/26/2019 01:45    Phillips Climes M.D on 12/28/2019 at 11:50 AM  To page go to [email or web address]

## 2019-12-28 NOTE — Consult Note (Signed)
NAME:  Shirley Bates, MRN:  [id number], DOB:  1925/09/21, LOS: 4 ADMISSION DATE:  12/24/2019, CONSULTATION DATE:  12/28/19 REFERRING MD:  Elgergawy, CHIEF COMPLAINT:  Pleural effusion  Brief History   86 y.o. F admitted 10/4 with Covid-19 PNA.  PMH also significant for HFrEF, atrial fibrillation, HTN, dementia.  She was admitted and has been on Media oxygen.  CT chest with R pleural effusion.  PCCM consulted for possible thoracentesis.  History of present illness   Shirley Bates is a 86 y.o. F with PMH of atrial fibrillation on Eliquis, cardiomyopathy and HFrEF, cirrhosis, HTN who was admitted 10/4 from assisted living with shortness of breath and covid-19 PNA, has received two doses Pfizer Covid vaccine.  She required 4L [place] above baseline 2-3.   CXR with bilateral pleural effusions R>L.   CT chest was obtained and showed bilateral upper and RML PNA with moderate R pleural effusion.  Most recent Echo in 09/2019 with EF of 30-35%.  She has been receiving Lasix IV and po, though had borderline low BP's and received 20mg  po only today. She is on Eliquis for atrial fibrillation, this was held 10/7 for possible IR thora, IR unable to do the procedure as she is Covid +, Eliquis resumed morning of 10/8.  Past Medical History   has a past medical history of Atrial fibrillation status post cardioversion [facility]) (10/12/2019), Cancer ([place]), Hyperlipidemia, and Hypertension.   Significant Hospital Events   10/4 admit to internal medicine   Consults:  PCCM  Procedures:    Significant Diagnostic Tests:  10/6 CT chest w/o contrast>>Bilateral upper lobe and right middle lobe airspace disease has an appearance most consistent with pneumonia. Small to moderate pleural effusions, larger on the right.  Micro Data:  10/4 Covid-19>>positive 10/4 Flu>>negative 10/5 MRSA screen>>positive 10/4 BCx2>>NG at 4 days   Antimicrobials:   Unasyn 10/5- Doxycycline 10/5 only Remdesevir 10/4-  Interim  history/subjective:    Objective   Blood pressure (!) 151/99, pulse (!) 104, temperature 98.7 F (37.1 C), resp. rate 19, weight 56.9 kg, SpO2 94 %.        Intake/Output Summary (Last 24 hours) at 12/28/2019 1746 Last data filed at 12/28/2019 1500 Gross per 24 hour  Intake 534.96 ml  Output 450 ml  Net 84.96 ml   Filed Weights   12/27/19 0344 12/28/19 0247 12/28/19 0559  Weight: 57.6 kg 56.2 kg 56.9 kg   General:  Elderly F, awake, staring, no distress HEENT: MM pink/dry Neuro: awake, conversational, though disoriented to person and place CV: s1s2 rrr, no m/r/g PULM:  No wheezing, no hypoxia or accessory use, mild rales in the bases GI: soft, non-tender bsx4 active  Extremities: warm/dry, no edema  Skin: no rashes or lesions   Resolved Hospital Problem list     Assessment & Plan:   Moderate R pleural effusion in the setting of chronic HF, cirrhosis and Covid-19 PNA Currently on her baseline 2L, no respiratory distress -Suspect her effusion is primarily secondary to heart failure, BNP rising, would attempt initially to diurese as her blood pressure will allow.  Received 20mg  po today with >500cc in cannister.   -Give 40mg  IVx1, monitor strict I&O's, goal 1-2L net negative daily -Hold Eliquis for 48hrs, PCCM will see again on 10/10 and consider thoracentesis if indicated -Please page for any concerns or worsening respiratory status        Labs   CBC: Recent Labs  Lab 12/24/19 1737 12/25/19 0047 12/26/19 0339 12/27/19 0423 12/28/19  0300  WBC 4.0 3.1* 3.9* 7.5 9.5  NEUTROABS 3.3 2.9 3.3 6.8 8.8*  HGB 10.9* 11.0* 12.2 13.0 13.2  HCT 33.9* 35.2* 38.0 39.7 40.5  MCV 92.4 93.6 91.8 91.7 92.0  PLT 155 179 152 211 400    Basic Metabolic Panel: Recent Labs  Lab 12/24/19 1737 12/25/19 0047 12/25/19 0841 12/26/19 0339 12/27/19 0423 12/28/19 0300  NA 138 139  --  142 142 145  K 3.2* 4.6  --  3.8 3.6 3.5  CL 107 101  --  104 102 103  CO2 23 29  --  28 25 30    GLUCOSE 115* 146*  --  175* 204* 192*  BUN 20 26*  --  37* 41* 42*  CREATININE 0.78 0.95  --  0.90 1.27* 0.92  CALCIUM 7.0* 8.1*  --  7.9* 8.0* 8.3*  MG  --   --  2.1  --   --   --    GFR: Estimated Creatinine Clearance: 30.9 mL/min (by C-G formula based on SCr of 0.92 mg/dL). Recent Labs  Lab 12/24/19 1737 12/24/19 1737 12/24/19 1739 12/25/19 0047 12/26/19 0339 12/27/19 0423 12/28/19 0300  PROCALCITON 0.15  --   --   --   --   --   --   WBC 4.0   < >  --  3.1* 3.9* 7.5 9.5  LATICACIDVEN  --   --  1.0  --   --   --   --    < > = values in this interval not displayed.    Liver Function Tests: Recent Labs  Lab 12/24/19 1737 12/25/19 0047 12/26/19 0339 12/27/19 0423 12/28/19 0300  AST 214* 248* 143* 104* 76*  ALT 100* 137* 115* 110* 97*  ALKPHOS 166* 186* 150* 145* 139*  BILITOT 0.8 1.0 0.4 0.7 0.7  PROT 4.5* 5.8* 5.2* 6.0* 5.8*  ALBUMIN 2.0* 2.5* 2.1* 2.5* 2.4*   No results for input(s): LIPASE, AMYLASE in the last 168 hours. No results for input(s): AMMONIA in the last 168 hours.  ABG No results found for: PHART, PCO2ART, PO2ART, HCO3, TCO2, ACIDBASEDEF, O2SAT   Coagulation Profile: No results for input(s): INR, PROTIME in the last 168 hours.  Cardiac Enzymes: No results for input(s): CKTOTAL, CKMB, CKMBINDEX, TROPONINI in the last 168 hours.  HbA1C: Hgb A1c MFr Bld  Date/Time Value Ref Range Status  04/30/2014 09:48 AM 5.8 (H) <5.7 % Final    Comment:                                                                           According to the ADA Clinical Practice Recommendations for 2011, when HbA1c is used as a screening test:     >=6.5%   Diagnostic of Diabetes Mellitus            (if abnormal result is confirmed)   5.7-6.4%   Increased risk of developing Diabetes Mellitus   References:Diagnosis and Classification of Diabetes Mellitus,Diabetes QQPY,1950,93(OIZTI 1):S62-S69 and Standards of Medical Care in         Diabetes - 2011,Diabetes  WPYK,9983,38 (Suppl 1):S11-S61.       CBG: No results for input(s): GLUCAP in the last 168 hours.  Review of  Systems:   Unable to obtain secondary to confusion  Past Medical History  She,  has a past medical history of Atrial fibrillation status post cardioversion [facility]) (10/12/2019), Cancer ([place]), Hyperlipidemia, and Hypertension.   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 fib.  Was given 150 amiodarone bolus   CARDIOVERSION N/A 11/16/2019   Procedure: CARDIOVERSION;  Surgeon: Acie Fredrickson Wonda Cheng, MD;  Location: [facility] ENDOSCOPY;  Service: Cardiovascular;  Laterality: N/A;   TEE WITHOUT CARDIOVERSION N/A 10/16/2019   Procedure: TRANSESOPHAGEAL ECHOCARDIOGRAM (TEE);  Surgeon: Skeet Latch, MD;  Location: [place];  Service: Cardiovascular;;  EF estimated 30 to 35% moderate decreased function.  Moderately reduced RV function.  No LAA thrombus.  Mild MR with multiple jets.  Mild to moderate TR.  Grade 2 aortic atheroma.    TRANSTHORACIC ECHOCARDIOGRAM  10/12/2019   EF 40 and 45%.  Moderate biatrial enlargement.  Mild to moderate AI.     Social History   reports that she has never smoked. She has never used smokeless tobacco. She reports that she does not drink alcohol and does not use drugs.   Family History   Her family history includes Heart disease in her brother, father, and sister; Hypertension in her brother. There is no history of Breast cancer.   Allergies No Known Allergies   Home Medications  Prior to Admission medications   Medication Sig Start Date End Date Taking? Authorizing Provider  acetaminophen (TYLENOL) 500 MG tablet Take 500 mg by mouth every 6 (six) hours as needed for mild pain.   Yes  [provider]  aluminum-magnesium hydroxide-simethicone (MAALOX) 200-200-20 MG/5ML SUSP Take 30 mLs by mouth every 6 (six) hours as needed (indigestion).   Yes [provider]  amiodarone (PACERONE) 200 MG tablet Take 1 tablet (200 mg total) by mouth daily. 11/15/19  Yes Leonie Man, MD  apixaban (ELIQUIS) 2.5 MG TABS tablet Take 1 tablet (2.5 mg total) by mouth 2 (two) times daily. 11/14/19  Yes Leonie Man, MD  ferrous sulfate 325 (65 FE) MG tablet Take 1 tablet (325 mg total) by mouth daily with breakfast. 11/14/19  Yes Leonie Man, MD  furosemide (LASIX) 20 MG tablet Take 20 mg everyday except on Mon, Wed,and Fri. Take 40 mg ( 2 tablets). If weight is greater than 3 lbs or increase swelling. Take 40 mg for 3 days then return daily dosing. Patient taking differently: Take 20 mg by mouth. Take 20 mg by mouth on Tuesday, Thursday, Saturday and Sunday. If weight is greater than 3 lbs or increase swelling, take 40 mg for 3 days then return daily dosing. 11/08/19  Yes Leonie Man, MD  guaiFENesin-dextromethorphan [facility] DM) 100-10 MG/5ML syrup Take 5 mLs by mouth every 4 (four) hours as needed for cough. 10/17/19  Yes Aline August, MD  loperamide (IMODIUM) 2 MG capsule Take 2 mg by mouth as needed for diarrhea or loose stools (total 8 doses in 24 HR).   Yes [provider]  losartan (COZAAR) 25 MG tablet Take 1 tablet (25 mg total) by mouth daily. 11/14/19  Yes Leonie Man, MD  magnesium hydroxide (MILK OF MAGNESIA) 400 MG/5ML suspension Take 30 mLs by mouth at bedtime as needed for  mild constipation.   Yes [provider]  metoprolol succinate (TOPROL-XL) 100 MG 24 hr tablet Take 1 tablet (100 mg total) by mouth daily. Take with or immediately following a meal. 12/03/19 03/02/20 Yes Sande Rives E, PA-C  Multi Vit-Fluoride-Folic Acid (MULTIVITAMIN/FLUORIDE PO) Take 1 tablet by mouth every morning. Chewable   Yes [provider]    multivitamin-iron-minerals-folic acid (CENTRUM) chewable tablet Chew 1 tablet by mouth daily.   Yes [provider]  naphazoline-pheniramine (NAPHCON-A) 0.025-0.3 % ophthalmic solution 1 drop daily.   Yes [provider]  neomycin-bacitracin-polymyxin (NEOSPORIN) ointment Apply 1 application topically as needed for wound care.   Yes [provider]  simvastatin (ZOCOR) 20 MG tablet TAKE 1 TABLET BY MOUTH AT BEDTIME Patient taking differently: Take 20 mg by mouth at bedtime.  12/19/19  Yes Susy Frizzle, MD  NONFORMULARY OR COMPOUNDED ITEM Apply 1 application topically at bedtime. Antifungal solution: Terbinafine 3%, Fluconazole 2%, Tea Tree Oil 5%, Urea 10%, Ibuprofen 2% in DMSO suspension #84mL  Apply at bedtime to all toenails except right 1st toe Patient not taking: Reported on 12/24/2019 10/17/19   Aline August, MD  potassium chloride SA (KLOR-CON) 20 MEQ tablet TAKE 1 TABLET(20 MEQ) BY MOUTH DAILY Patient not taking: Reported on 12/24/2019 12/03/19   Darreld Mclean, PA-C     Critical care time: 30 minutes      CRITICAL CARE Performed by: Otilio Carpen Marin Wisner   Total critical care time: 30 minutes  Critical care time was exclusive of separately billable procedures and treating other patients.  Critical care was necessary to treat or prevent imminent or life-threatening deterioration.  Critical care was time spent personally by me on the following activities: development of treatment plan with patient and/or surrogate as well as nursing, discussions with consultants, evaluation of patient's response to treatment, examination of patient, obtaining history from patient or surrogate, ordering and performing treatments and interventions, ordering and review of laboratory studies, ordering and review of radiographic studies, pulse oximetry and re-evaluation of patient's condition.  Otilio Carpen Modelle Vollmer, PA-C [place] [facility]  Pager# [phone], if no answer  [phone]

## 2019-12-28 NOTE — Progress Notes (Addendum)
Physical Therapy Treatment Patient Details Name: Shirley Bates MRN: [id number] DOB: [birth year] Today's Date: 12/28/2019    History of Present Illness Shirley Bates is a 86 y.o. female with history of cardiomyopathy, A. fib, hypertension, severe dementia was found to be Covid positive.  LIves in [place] where she was ambulatory adn assisted with her ADL tasks. Admitted due to fever and hypoxia requiring 4L O2 and CDR showing infiltrates.    PT Comments    Pt received in bed, sleepy but able to arouse with initiation of mobility. She required mod assist bed mobility, mod assist sit to stand, and mod assist ambulation 3' with RW. SpO2 88-91% on 3L. Eyes closed throughout session. Pt inconsistently following simple commands. Repetitively saying "help me, help me, help me. Okay." Pt in recliner with feet elevated at end of session. Pt requiring increased assist as compared to previous PT session. Discharge recommendation updated to SNF and 24-hour assist.    Follow Up Recommendations  SNF;Supervision/Assistance - 24 hour     Equipment Recommendations  None recommended by PT    Recommendations for Other Services       Precautions / Restrictions Precautions Precautions: Fall;Other (comment) Precaution Comments: watch HR, O2    Mobility  Bed Mobility Overal bed mobility: Needs Assistance Bed Mobility: Supine to Sit     Supine to sit: Mod assist;HOB elevated     General bed mobility comments: assist with BLE and trunk. Use of bed pad to scoot to EOB.  Transfers Overall transfer level: Needs assistance Equipment used: Rolling walker (2 wheeled) Transfers: Sit to/from Stand Sit to Stand: Mod assist         General transfer comment: assist to power up and stabilize balance  Ambulation/Gait Ambulation/Gait assistance: Mod assist Gait Distance (Feet): 3 Feet Assistive device: Rolling walker (2 wheeled) Gait Pattern/deviations: Step-through pattern;Trunk flexed;Decreased stride  length     General Gait Details: assist to maintain balance and manage RW. Desat to 88% on 3L. Max HR 114. SpO2 91% at rest. Resting HR 102.   Stairs             Wheelchair Mobility    Modified Rankin (Stroke Patients Only)       Balance Overall balance assessment: Needs assistance   Sitting balance-Leahy Scale: Good     Standing balance support: Bilateral upper extremity supported;During functional activity Standing balance-Leahy Scale: Poor Standing balance comment: reliant on external support                            Cognition Arousal/Alertness: Lethargic Behavior During Therapy: Impulsive Overall Cognitive Status: No family/caregiver present to determine baseline cognitive functioning                                 General Comments: Baseline dementia. Resides in memory care unit at ALF. Eyes closed during session. Repeatively saying "help me, help me, help. Okay." Following simple commands inconsistently and with increased time.      Exercises      General Comments        Pertinent Vitals/Pain Pain Assessment: Faces Faces Pain Scale: Hurts a little bit Pain Location: generalized with mobility Pain Descriptors / Indicators: Discomfort;Grimacing Pain Intervention(s): Repositioned;Monitored during session;Limited activity within patient's tolerance    Home Living  Prior Function            PT Goals (current goals can now be found in the care plan section) Acute Rehab PT Goals Patient Stated Goal: not stated Progress towards PT goals: Not progressing toward goals - comment (less responsive today)    Frequency    Min 2X/week      PT Plan Discharge plan needs to be updated    Co-evaluation              AM-PAC PT "6 Clicks" Mobility   Outcome Measure  Help needed turning from your back to your side while in a flat bed without using bedrails?: A Little Help needed moving from  lying on your back to sitting on the side of a flat bed without using bedrails?: A Little Help needed moving to and from a bed to a chair (including a wheelchair)?: A Lot Help needed standing up from a chair using your arms (e.g., wheelchair or bedside chair)?: A Lot Help needed to walk in hospital room?: A Lot Help needed climbing 3-5 steps with a railing? : A Lot 6 Click Score: 14    End of Session Equipment Utilized During Treatment: Gait belt;Oxygen Activity Tolerance: Patient tolerated treatment well Patient left: in chair;with call bell/phone within reach;with chair alarm set Nurse Communication: Mobility status PT Visit Diagnosis: Other abnormalities of gait and mobility (R26.89);Difficulty in walking, not elsewhere classified (R26.2);Muscle weakness (generalized) (M62.81)     Time: 3338-3291 PT Time Calculation (min) (ACUTE ONLY): 23 min  Charges:  $Gait Training: 8-22 mins $Therapeutic Activity: 8-22 mins                     Lorrin Goodell, PT  Office # [phone] Pager [phone]    Shirley Bates 12/28/2019, 12:25 PM

## 2019-12-28 NOTE — Plan of Care (Signed)
  Problem: Clinical Measurements: Goal: Ability to maintain clinical measurements within normal limits will improve Outcome: Progressing Goal: Will remain free from infection Outcome: Progressing Goal: Diagnostic test results will improve Outcome: Progressing Goal: Respiratory complications will improve Outcome: Progressing Goal: Cardiovascular complication will be avoided Outcome: Progressing   Problem: Activity: Goal: Risk for activity intolerance will decrease Outcome: Progressing   Problem: Elimination: Goal: Will not experience complications related to urinary retention Outcome: Progressing   Problem: Pain Managment: Goal: General experience of comfort will improve Outcome: Progressing   Problem: Safety: Goal: Ability to remain free from injury will improve Outcome: Progressing   Problem: Skin Integrity: Goal: Risk for impaired skin integrity will decrease Outcome: Progressing   Problem: Nutrition: Goal: Adequate nutrition will be maintained Outcome: Not Progressing   Problem: Coping: Goal: Level of anxiety will decrease Outcome: Not Progressing   Problem: Elimination: Goal: Will not experience complications related to bowel motility Outcome: Not Progressing   Problem: Education: Goal: Knowledge of General Education information will improve Description: Including pain rating scale, medication(s)/side effects and non-pharmacologic comfort measures Outcome: Not Met (add Reason)   Problem: Health Behavior/Discharge Planning: Goal: Ability to manage health-related needs will improve Outcome: Not Met (add Reason)

## 2019-12-28 NOTE — NC FL2 (Addendum)
MEDICAID FL2 LEVEL OF CARE SCREENING TOOL     IDENTIFICATION  Patient Name: Shirley Bates Birthdate: 1925-05-17 Sex: female Admission Date (Current Location): 12/24/2019  [facility] and [place] Number:  Herbalist and Address:  [facility] [place] [address] [place], [place] [address]      Provider Number: [id number]  Attending Physician Name and Address:  Elgergawy, Silver Huguenin, MD  Relative Name and Phone Number:  [name] [phone]    Current Level of Care: Hospital Recommended Level of Care: [place] Prior Approval Number:    Date Approved/Denied:   PASRR Number:    Discharge Plan: SNF    Current Diagnoses: Patient Active Problem List   Diagnosis Date Noted   Pressure injury of skin 12/25/2019   Acute respiratory failure ([place]) 12/24/2019   Acute respiratory disease due to COVID-19 virus 81/19/1478   Acute systolic heart failure ([place]) 11/18/2019   Persistent atrial fibrillation ([place])    Atrial fibrillation, new onset ([place]) 11/08/2019   Impaired mobility and activities of daily living 10/14/2019   Atrial fibrillation with RVR ([place]) 10/12/2019   Fluid overload 10/12/2019   Anemia 10/12/2019   Dilated cardiomyopathy ([place]) 10/12/2019   Osteoporosis 05/28/2015   Left ear impacted cerumen 12/12/2012   OA (osteoarthritis) of neck 12/12/2012   Hyperlipidemia    Essential hypertension     Orientation RESPIRATION BLADDER Height & Weight     Self  O2 (O2 @ 4L) Incontinent, External catheter Weight: 56.9 kg Height:     BEHAVIORAL SYMPTOMS/MOOD NEUROLOGICAL BOWEL NUTRITION STATUS     (n/a) Continent Diet (Soft diet 1200cc fluid restriction)  AMBULATORY STATUS COMMUNICATION OF NEEDS Skin   Supervision Verbally PU Stage and Appropriate Care   PU Stage 2 Dressing: No Dressing (moisture barrier as needed)                   Personal Care Assistance Level of Assistance  Bathing, Feeding,  Dressing Bathing Assistance: Limited assistance Feeding assistance: Independent (set up only) Dressing Assistance: Limited assistance     Functional Limitations Info  Sight, Hearing, Speech Sight Info: Adequate Hearing Info: Adequate Speech Info: Adequate    SPECIAL CARE FACTORS FREQUENCY  PT (By licensed PT), OT (By licensed OT)     PT Frequency: 5X OT Frequency: 5X            Contractures      Additional Factors Info  Code Status, Allergies, Psychotropic, Insulin Sliding Scale, Isolation Precautions, Suctioning Needs Code Status Info: DNR Allergies Info: NKDA Psychotropic Info: Hx of Advanced dementia / Seroquel Q HS Insulin Sliding Scale Info: n/a see d/c summary for medication list Isolation Precautions Info: Contact and airborne MRSA/ COVID positive Suctioning Needs: n/a   Current Medications (12/28/2019):  This is the current hospital active medication list Current Facility-Administered Medications  Medication Dose Route Frequency Provider Last Rate Last Admin   (feeding supplement) PROSource Plus liquid 30 mL  30 mL Oral BID BM Elgergawy, Silver Huguenin, MD   30 mL at 12/28/19 1229   acetaminophen (TYLENOL) tablet 650 mg  650 mg Oral Q6H PRN Rise Patience, MD       amiodarone (PACERONE) tablet 200 mg  200 mg Oral Daily Rise Patience, MD   200 mg at 12/28/19 0929   Ampicillin-Sulbactam (UNASYN) 3 g in sodium chloride 0.9 % 100 mL IVPB  3 g Intravenous Q12H Elgergawy, Silver Huguenin, MD 200 mL/hr at 12/28/19 1029  3 g at 12/28/19 1029   apixaban (ELIQUIS) tablet 2.5 mg  2.5 mg Oral BID Elgergawy, Silver Huguenin, MD   2.5 mg at 12/28/19 0929   chlorpheniramine-HYDROcodone (TUSSIONEX) 10-8 MG/5ML suspension 5 mL  5 mL Oral Q12H PRN Rise Patience, MD   5 mL at 12/27/19 0327   feeding supplement (ENSURE ENLIVE) (ENSURE ENLIVE) liquid 237 mL  237 mL Oral BID BM Elgergawy, Silver Huguenin, MD   237 mL at 12/28/19 1027   ferrous sulfate tablet 325 mg  325 mg Oral Q  breakfast Rise Patience, MD   325 mg at 12/28/19 5456   furosemide (LASIX) tablet 20 mg  20 mg Oral Q M,W,F Rise Patience, MD   20 mg at 12/28/19 0929   guaiFENesin-dextromethorphan (ROBITUSSIN DM) 100-10 MG/5ML syrup 10 mL  10 mL Oral Q4H PRN Rise Patience, MD   10 mL at 12/28/19 0251   methylPREDNISolone sodium succinate (SOLU-MEDROL) 125 mg/2 mL injection 60 mg  60 mg Intravenous Q12H Thurnell Lose, MD   60 mg at 12/28/19 0927   metoprolol succinate (TOPROL-XL) 24 hr tablet 100 mg  100 mg Oral Daily Rise Patience, MD   100 mg at 12/28/19 2563   multivitamin with minerals tablet 1 tablet  1 tablet Oral Daily Elgergawy, Silver Huguenin, MD   1 tablet at 12/28/19 0929   mupirocin ointment (BACTROBAN) 2 % 1 application  1 application Nasal BID Elgergawy, Silver Huguenin, MD   1 application at 89/37/34 0929   ondansetron (ZOFRAN) injection 4 mg  4 mg Intravenous Q6H PRN Rise Patience, MD       pantoprazole (PROTONIX) EC tablet 40 mg  40 mg Oral Daily Thurnell Lose, MD   40 mg at 12/28/19 0929   potassium chloride (KLOR-CON) CR tablet 30 mEq  30 mEq Oral Q6H Elgergawy, Silver Huguenin, MD   30 mEq at 12/28/19 1228   QUEtiapine (SEROQUEL) tablet 25 mg  25 mg Oral QHS Elgergawy, Silver Huguenin, MD       simvastatin (ZOCOR) tablet 20 mg  20 mg Oral QHS Rise Patience, MD   20 mg at 12/28/19 0255     Discharge Medications: Please see discharge summary for a list of discharge medications.  Relevant Imaging Results:  Relevant Lab Results:   Additional Information SSN [id number]     COVID POSITIVE patient  Angelita Ingles, RN

## 2019-12-29 LAB — CBC WITH DIFFERENTIAL/PLATELET
Abs Immature Granulocytes: 0.06 10*3/uL (ref 0.00–0.07)
Basophils Absolute: 0 10*3/uL (ref 0.0–0.1)
Basophils Relative: 0 %
Eosinophils Absolute: 0 10*3/uL (ref 0.0–0.5)
Eosinophils Relative: 0 %
HCT: 40.7 % (ref 36.0–46.0)
Hemoglobin: 13.2 g/dL (ref 12.0–15.0)
Immature Granulocytes: 1 %
Lymphocytes Relative: 3 %
Lymphs Abs: 0.3 10*3/uL — ABNORMAL LOW (ref 0.7–4.0)
MCH: 29.7 pg (ref 26.0–34.0)
MCHC: 32.4 g/dL (ref 30.0–36.0)
MCV: 91.7 fL (ref 80.0–100.0)
Monocytes Absolute: 0.4 10*3/uL (ref 0.1–1.0)
Monocytes Relative: 4 %
Neutro Abs: 7.6 10*3/uL (ref 1.7–7.7)
Neutrophils Relative %: 92 %
Platelets: 181 10*3/uL (ref 150–400)
RBC: 4.44 MIL/uL (ref 3.87–5.11)
RDW: 18.4 % — ABNORMAL HIGH (ref 11.5–15.5)
WBC: 8.3 10*3/uL (ref 4.0–10.5)
nRBC: 0 % (ref 0.0–0.2)

## 2019-12-29 LAB — COMPREHENSIVE METABOLIC PANEL
ALT: 82 U/L — ABNORMAL HIGH (ref 0–44)
AST: 60 U/L — ABNORMAL HIGH (ref 15–41)
Albumin: 2.3 g/dL — ABNORMAL LOW (ref 3.5–5.0)
Alkaline Phosphatase: 130 U/L — ABNORMAL HIGH (ref 38–126)
Anion gap: 10 (ref 5–15)
BUN: 34 mg/dL — ABNORMAL HIGH (ref 8–23)
CO2: 40 mmol/L — ABNORMAL HIGH (ref 22–32)
Calcium: 8 mg/dL — ABNORMAL LOW (ref 8.9–10.3)
Chloride: 97 mmol/L — ABNORMAL LOW (ref 98–111)
Creatinine, Ser: 0.93 mg/dL (ref 0.44–1.00)
GFR, Estimated: 53 mL/min — ABNORMAL LOW (ref >60)
Glucose, Bld: 203 mg/dL — ABNORMAL HIGH (ref 70–99)
Potassium: 3.1 mmol/L — ABNORMAL LOW (ref 3.5–5.1)
Sodium: 147 mmol/L — ABNORMAL HIGH (ref 135–145)
Total Bilirubin: 0.8 mg/dL (ref 0.3–1.2)
Total Protein: 5.6 g/dL — ABNORMAL LOW (ref 6.5–8.1)

## 2019-12-29 LAB — C-REACTIVE PROTEIN: CRP: 2 mg/dL — ABNORMAL HIGH (ref <1.0)

## 2019-12-29 LAB — CULTURE, BLOOD (ROUTINE X 2)
Culture: NO GROWTH
Culture: NO GROWTH
Special Requests: ADEQUATE
Special Requests: ADEQUATE

## 2019-12-29 LAB — D-DIMER, QUANTITATIVE: D-Dimer, Quant: 1.77 ug/mL-FEU — ABNORMAL HIGH (ref 0.00–0.50)

## 2019-12-29 LAB — BRAIN NATRIURETIC PEPTIDE: B Natriuretic Peptide: 2256.7 pg/mL — ABNORMAL HIGH (ref 0.0–100.0)

## 2019-12-29 LAB — MAGNESIUM: Magnesium: 2 mg/dL (ref 1.7–2.4)

## 2019-12-29 MED ORDER — FUROSEMIDE 10 MG/ML IJ SOLN
20.0000 mg | Freq: Once | INTRAMUSCULAR | Status: AC
  Administered 2019-12-29: 20 mg via INTRAVENOUS

## 2019-12-29 MED ORDER — POTASSIUM CHLORIDE CRYS ER 20 MEQ PO TBCR
40.0000 meq | EXTENDED_RELEASE_TABLET | ORAL | Status: AC
  Administered 2019-12-29 (×2): 40 meq via ORAL
  Filled 2019-12-29: qty 2

## 2019-12-29 MED ORDER — FUROSEMIDE 10 MG/ML IJ SOLN: 40.0000 mg | Freq: Once | INTRAMUSCULAR | Status: DC

## 2019-12-29 NOTE — Progress Notes (Signed)
Updated patient daughter [name].

## 2019-12-29 NOTE — Progress Notes (Signed)
PROGRESS NOTE                                                                                                                                                                                                             Patient Demographics:    Shirley Bates, is a 86 y.o. female, DOB - Jul 28, 1925, ZTI:[id number]  Admit date - 12/24/2019   Admitting Physician Rise Patience, MD  Outpatient Primary MD for the patient is Kurth-Bowen, Cornelia, PA-C  LOS - 5  Chief Complaint  Patient presents with  . Covid Positive  . Cough  . Shortness of Breath       Brief Narrative    Shirley Bates is a 86 y.o. female with history of cardiomyopathy, A. fib, hypertension, severe dementia was found to be Covid positive yesterday.  As per the patient's daughter one of the residents at the living facility was tested positive last week and subsequently all the residents were being checked alternatingly every other day for Covid test.  Patient turned out to be Covid positive yesterday but was asymptomatic.  But since this morning patient was getting more febrile and was brought to the ER.   Subjective:    Shirley Bates today with no significant events overnight as discussed with staff, she had a very good appetite where he finished all her breakfast this morning, but she does have some difficulty chewing given she has no teeth, diet was changed to dysphagia .    Assessment  & Plan :    Acute respiratory failure with hypoxia secondary to COVID-19 infection  -He has improved oxygen requirement, this morning she is on 2 L of oxygen nasal cannula. -Continue with IV steroids . -Continue with IV remdesivir . -Borderline procalcitonin, possible bacterial pneumonia, continue with IV Unasyn. - due to her age she is tenuous, will encourage her to sit up in chair use I-S and flutter valve for pulmonary toiletry, advance activity and titrate down oxygen as much as we can.    Recent Labs  Lab  12/24/19 1737 12/24/19 1737 12/24/19 1739 12/24/19 1839 12/25/19 0047 12/26/19 0339 12/26/19 0348 12/27/19 0423 12/28/19 0300 12/29/19 0212  WBC 4.0   < >  --   --  3.1* 3.9*  --  7.5 9.5 8.3  CRP 6.1*   < >  --   --  8.1* 6.0*  --  4.2* 3.4* 2.0*  DDIMER 1.18*   < >  --   --  1.25* 1.05*  --  1.15* 1.37* 1.77*  BNP  --   --   --   --  843.9*  --  1,160.1* 1,008.4* 1,579.5* 2,256.7*  PROCALCITON 0.15  --   --   --   --   --   --   --   --   --   LATICACIDVEN  --   --  1.0  --   --   --   --   --   --   --   AST 214*   < >  --   --  248* 143*  --  104* 76* 60*  ALT 100*   < >  --   --  137* 115*  --  110* 97* 82*  ALKPHOS 166*   < >  --   --  186* 150*  --  145* 139* 130*  BILITOT 0.8   < >  --   --  1.0 0.4  --  0.7 0.7 0.8  ALBUMIN 2.0*   < >  --   --  2.5* 2.1*  --  2.5* 2.4* 2.3*  SARSCOV2NAA  --   --   --  POSITIVE*  --   --   --   --   --   --    < > = values in this interval not displayed.    Markedly elevated LFTs  -Secondary to COVID-19 pneumonia, with known underlining chronic liver disease/cirrhosis at baseline, as discussed with daughter, they are aware of this diagnosis, decision was made for no further work-up at this point given her advanced age. -Trending down, continue to monitor closely  A. fib with RVR -Heart rate uncontrolled overnight, continue with beta-blockers, continue with amiodarone, she required IV digoxin x1. -She is on Eliquis for anticoagulation.  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   -Blood pressure and heart rate very tenuous, so she is on her home dose medication 20 mg of Lasix every other day, and she has been receiving Lasix on as-needed basis, but so far she has been tolerating 40 mg of IV Lasix daily , given some hyponatremia today and no edema, will give 20 mg of IV Lasix today . -We will hold her ARB given worsening creatinine .  Hypernatremia -Sodium of 147, this is due to diuresis, she was  encouraged to increase her free water intake.  Hypokalemia -With potassium of 3.1, repleted, recheck in a.m.  AKI -Creatinine is up to 1.27, have stopped losartan Courage her fluid intake, this seems to be improving  Right Pleural  effusion -Most likely due to CHF/and liver cirrhosis, significant amount, discussed with IR, they are unable to perform thoracentesis, PCCM were consulted, continue with aggressive diuresis for now, and hold Eliquis for possible need of thoracentesis if no improvement with IV diuresis.   Severe dementia.  No acute issues.   Hospital delirium -Patient with significant hospital delirium, requiring frequent Haldol dosing, she is currently on Seroquel nightly.    Condition - Extremely Guarded  Family Communication  :  Daughter [phone] updated daily, most recent 10/8. Code Status :  DNR  Consults  :  None  Procedures  :  None  PUD Prophylaxis : PPI  Disposition Plan  :    Status is: Inpatient  Remains inpatient appropriate because:IV treatments appropriate due to intensity of illness or inability to take PO   Dispo: The patient is from: SNF  Anticipated d/c is to: SNF              Anticipated d/c date is: 2 days              Patient currently is not medically stable to d/c.    DVT Prophylaxis  :  Eliquis on hold for possible thoracentesis.  Lab Results  Component Value Date   PLT 181 12/29/2019    Diet :  Diet Order            DIET DYS 3 Room service appropriate? Yes; Fluid consistency: Thin  Diet effective now                  Inpatient Medications Scheduled Meds: . (feeding supplement) PROSource Plus  30 mL Oral BID BM  . amiodarone  200 mg Oral Daily  . feeding supplement (ENSURE ENLIVE)  237 mL Oral BID BM  . ferrous sulfate  325 mg Oral Q breakfast  . furosemide  20 mg Oral Q M,W,F  . methylPREDNISolone (SOLU-MEDROL) injection  60 mg Intravenous Q12H  . metoprolol succinate  100 mg Oral Daily  .  multivitamin with minerals  1 tablet Oral Daily  . mupirocin ointment  1 application Nasal BID  . pantoprazole  40 mg Oral Daily  . QUEtiapine  25 mg Oral QHS  . simvastatin  20 mg Oral QHS   Continuous Infusions: . ampicillin-sulbactam (UNASYN) IV 3 g (12/29/19 1028)   PRN Meds:.acetaminophen **OR** [DISCONTINUED] acetaminophen, chlorpheniramine-HYDROcodone, guaiFENesin-dextromethorphan, [DISCONTINUED] ondansetron **OR** ondansetron (ZOFRAN) IV  Antibiotics  :   Anti-infectives (From admission, onward)   Start     Dose/Rate Route Frequency Ordered Stop   12/25/19 1015  Ampicillin-Sulbactam (UNASYN) 3 g in sodium chloride 0.9 % 100 mL IVPB        3 g 200 mL/hr over 30 Minutes Intravenous Every 12 hours 12/25/19 1008 12/30/19 1014   12/25/19 1000  remdesivir 100 mg in sodium chloride 0.9 % 100 mL IVPB       "Followed by" Linked Group Details   100 mg 200 mL/hr over 30 Minutes Intravenous Daily 12/24/19 2133 12/28/19 1923   12/25/19 0600  doxycycline (VIBRAMYCIN) 100 mg in sodium chloride 0.9 % 250 mL IVPB  Status:  Discontinued        100 mg 125 mL/hr over 120 Minutes Intravenous Every 12 hours 12/25/19 0534 12/25/19 0957   12/24/19 2200  remdesivir 200 mg in sodium chloride 0.9% 250 mL IVPB       "Followed by" Linked Group Details   200 mg 580 mL/hr over 30 Minutes Intravenous Once 12/24/19 2133 12/25/19 0903          Objective:   Vitals:   12/28/19 1953 12/28/19 2215 12/29/19 0629 12/29/19 0747  BP:  (!) 143/68  (!) 148/83  Pulse:  88  89  Resp:  20  16  Temp:  98.5 F (36.9 C)    TempSrc:  Axillary    SpO2: 97% 98%    Weight:   52 kg     SpO2: 98 % O2 Flow Rate (L/min): 2 L/min  Wt Readings from Last 3 Encounters:  12/29/19 52 kg  12/03/19 58.1 kg  11/19/19 56.7 kg     Intake/Output Summary (Last 24 hours) at 12/29/2019 1336 Last data filed at 12/29/2019 1028 Gross per 24 hour  Intake 495.5 ml  Output 2000 ml  Net -1504.5 ml     Physical  Exam  Patient is more awake,  demented at baseline, but follow commands and pleasant  Diminished air entry at the bases, right more than left, no wheezing or rhonchi  Irregular irregular, no rubs or gallops Abdomen soft Extremities with no edema today.    Pressure Injury 12/25/19 Coccyx Medial Stage 2 -  Partial thickness loss of dermis presenting as a shallow open injury with a red, pink wound bed without slough. (Active)  12/25/19 0100  Location: Coccyx  Location Orientation: Medial  Staging: Stage 2 -  Partial thickness loss of dermis presenting as a shallow open injury with a red, pink wound bed without slough.  Wound Description (Comments):   Present on Admission: Yes     Data Review:   Recent Labs  Lab 12/25/19 0047 12/26/19 0339 12/27/19 0423 12/28/19 0300 12/29/19 0212  WBC 3.1* 3.9* 7.5 9.5 8.3  HGB 11.0* 12.2 13.0 13.2 13.2  HCT 35.2* 38.0 39.7 40.5 40.7  PLT 179 152 211 220 181  MCV 93.6 91.8 91.7 92.0 91.7  MCH 29.3 29.5 30.0 30.0 29.7  MCHC 31.3 32.1 32.7 32.6 32.4  RDW 18.8* 18.5* 18.5* 18.4* 18.4*  LYMPHSABS 0.1* 0.4* 0.3* 0.3* 0.3*  MONOABS 0.1 0.2 0.3 0.4 0.4  EOSABS 0.0 0.0 0.0 0.0 0.0  BASOSABS 0.0 0.0 0.0 0.0 0.0    Recent Labs  Lab 12/24/19 1737 12/24/19 1737 12/24/19 1739 12/25/19 0047 12/25/19 0841 12/26/19 0339 12/26/19 0348 12/27/19 0423 12/28/19 0300 12/29/19 0212 12/29/19 0636  NA 138   < >  --  139  --  142  --  142 145 147*  --   K 3.2*   < >  --  4.6  --  3.8  --  3.6 3.5 3.1*  --   CL 107   < >  --  101  --  104  --  102 103 97*  --   CO2 23   < >  --  29  --  28  --  25 30 40*  --   GLUCOSE 115*   < >  --  146*  --  175*  --  204* 192* 203*  --   BUN 20   < >  --  26*  --  37*  --  41* 42* 34*  --   CREATININE 0.78   < >  --  0.95  --  0.90  --  1.27* 0.92 0.93  --   CALCIUM 7.0*   < >  --  8.1*  --  7.9*  --  8.0* 8.3* 8.0*  --   AST 214*   < >  --  248*  --  143*  --  104* 76* 60*  --   ALT 100*   < >  --  137*  --  115*   --  110* 97* 82*  --   ALKPHOS 166*   < >  --  186*  --  150*  --  145* 139* 130*  --   BILITOT 0.8   < >  --  1.0  --  0.4  --  0.7 0.7 0.8  --   ALBUMIN 2.0*   < >  --  2.5*  --  2.1*  --  2.5* 2.4* 2.3*  --   MG  --   --   --   --  2.1  --   --   --   --   --  2.0  CRP 6.1*   < >  --  8.1*  --  6.0*  --  4.2* 3.4* 2.0*  --   DDIMER 1.18*   < >  --  1.25*  --  1.05*  --  1.15* 1.37* 1.77*  --   PROCALCITON 0.15  --   --   --   --   --   --   --   --   --   --   LATICACIDVEN  --   --  1.0  --   --   --   --   --   --   --   --   BNP  --   --   --  843.9*  --   --  1,160.1* 1,008.4* 1,579.5* 2,256.7*  --    < > = values in this interval not displayed.    Recent Labs  Lab 12/24/19 1737 12/24/19 1737 12/24/19 1839 12/25/19 0047 12/26/19 0339 12/26/19 0348 12/27/19 0423 12/28/19 0300 12/29/19 0212  CRP 6.1*   < >  --  8.1* 6.0*  --  4.2* 3.4* 2.0*  DDIMER 1.18*   < >  --  1.25* 1.05*  --  1.15* 1.37* 1.77*  BNP  --   --   --  843.9*  --  1,160.1* 1,008.4* 1,579.5* 2,256.7*  PROCALCITON 0.15  --   --   --   --   --   --   --   --   SARSCOV2NAA  --   --  POSITIVE*  --   --   --   --   --   --    < > = values in this interval not displayed.    ------------------------------------------------------------------------------------------------------------------ No results for input(s): CHOL, HDL, LDLCALC, TRIG, CHOLHDL, LDLDIRECT in the last 72 hours.  Lab Results  Component Value Date   HGBA1C 5.8 (H) 04/30/2014   ------------------------------------------------------------------------------------------------------------------ No results for input(s): TSH, T4TOTAL, T3FREE, THYROIDAB in the last 72 hours.  Invalid input(s): FREET3 ------------------------------------------------------------------------------------------------------------------ No results for input(s): VITAMINB12, FOLATE, FERRITIN, TIBC, IRON, RETICCTPCT in the last 72 hours.  Coagulation profile No results for  input(s): INR, PROTIME in the last 168 hours.  Recent Labs    12/28/19 0300 12/29/19 0212  DDIMER 1.37* 1.77*    Cardiac Enzymes No results for input(s): CKMB, TROPONINI, MYOGLOBIN in the last 168 hours.  Invalid input(s): CK ------------------------------------------------------------------------------------------------------------------    Component Value Date/Time   BNP 2,256.7 (H) 12/29/2019 6010    Micro Results Recent Results (from the past 240 hour(s))  Blood Culture (routine x 2)     Status: None   Collection Time: 12/24/19  5:37 PM   Specimen: BLOOD LEFT ARM  Result Value Ref Range Status   Specimen Description BLOOD LEFT ARM  Final   Special Requests   Final    BOTTLES DRAWN AEROBIC AND ANAEROBIC Blood Culture adequate volume   Culture   Final    NO GROWTH 5 DAYS Performed at [facility], [address] [place], [place] [address]    Report Status 12/29/2019 FINAL  Final  Blood Culture (routine x 2)     Status: None   Collection Time: 12/24/19  5:51 PM   Specimen: BLOOD RIGHT HAND  Result Value Ref Range Status   Specimen Description BLOOD RIGHT HAND  Final   Special Requests   Final    BOTTLES DRAWN AEROBIC AND ANAEROBIC Blood Culture adequate volume   Culture   Final    NO GROWTH 5 DAYS Performed at [facility], [place] [address], [place], [place] [address]    Report Status 12/29/2019 FINAL  Final  Respiratory Panel by RT PCR (Flu A&B, Covid) - Nasopharyngeal Swab     Status: Abnormal   Collection Time: 12/24/19  6:39 PM   Specimen: Nasopharyngeal Swab  Result Value Ref Range Status   SARS Coronavirus 2 by RT PCR POSITIVE (A) NEGATIVE Final    Comment: RESULT CALLED TO, READ BACK BY AND VERIFIED WITH: Trudi Ida RN 2030 12/24/19 A BROWNING (NOTE) SARS-CoV-2 target nucleic acids are DETECTED.  SARS-CoV-2 RNA is generally detectable in upper respiratory specimens  during the acute phase of infection. Positive results are indicative of the  presence of the identified virus, but do not rule out bacterial infection or co-infection with other pathogens not detected by the test. Clinical correlation with patient history and other diagnostic information is necessary to determine patient infection status. The expected result is Negative.  Fact Sheet for Patients:  PinkCheek.be  Fact Sheet for Healthcare Providers: GravelBags.it  This test is not yet approved or cleared by the [place] FDA and  has been authorized for detection and/or diagnosis of SARS-CoV-2 by FDA under an Emergency Use Authorization (EUA).  This EUA will remain in effect (meaning this test can be  used) for the duration of  the COVID-19 declaration under Section 564(b)(1) of the Act, 21 U.S.C. section 360bbb-3(b)(1), unless the authorization is terminated or revoked sooner.      Influenza A by PCR NEGATIVE NEGATIVE Final   Influenza B by PCR NEGATIVE NEGATIVE Final    Comment: (NOTE) The Xpert Xpress SARS-CoV-2/FLU/RSV assay is intended as an aid in  the diagnosis of influenza from Nasopharyngeal swab specimens and  should not be used as a sole basis for treatment. Nasal washings and  aspirates are unacceptable for Xpert Xpress SARS-CoV-2/FLU/RSV  testing.  Fact Sheet for Patients: PinkCheek.be  Fact Sheet for Healthcare Providers: GravelBags.it  This test is not yet approved or cleared by the [place] FDA and  has been authorized for detection and/or diagnosis of SARS-CoV-2 by  FDA under an Emergency Use Authorization (EUA). This EUA will remain  in effect (meaning this test can be used) for the duration of the  Covid-19 declaration under Section 564(b)(1) of the Act, 21  U.S.C. section 360bbb-3(b)(1), unless the authorization is  terminated or revoked. Performed at [facility], [place] [address], [place],  [place] [address]   MRSA PCR Screening     Status: Abnormal   Collection Time: 12/25/19 11:38 AM   Specimen: Nasal Mucosa; Nasopharyngeal  Result Value Ref Range Status   MRSA by PCR POSITIVE (A) NEGATIVE Final    Comment:        The GeneXpert MRSA Assay (FDA approved for NASAL specimens only), is one component of a comprehensive MRSA colonization surveillance program. It is not intended to diagnose MRSA infection nor to guide or monitor treatment for MRSA infections. RESULT CALLED TO, READ BACK BY AND VERIFIED WITH: Genella Rife RN 15:30 12/25/19 ([email or web address]) Performed at [facility], [place] [address], [place], [place] [address]   Surgical PCR screen     Status: Abnormal   Collection Time: 12/26/19 11:46 PM   Specimen: Nasal Mucosa; Nasal Swab  Result Value Ref Range Status   MRSA, PCR POSITIVE (A) NEGATIVE Final    Comment: RESULT CALLED TO, READ BACK BY AND VERIFIED WITH: [facility] RN 12/27/19 0531 JDW    Staphylococcus aureus POSITIVE (  A) NEGATIVE Final    Comment: (NOTE) The Xpert SA Assay (FDA approved for NASAL specimens in patients 33 years of age and older), is one component of a comprehensive surveillance program. It is not intended to diagnose infection nor to guide or monitor treatment. Performed at [facility], [place] [address] [place], [place] [address]     Radiology Reports CT CHEST WO CONTRAST  Result Date: 12/26/2019 CLINICAL DATA:  COVID-19 positive patient with cough and congestion. EXAM: CT CHEST WITHOUT CONTRAST TECHNIQUE: Multidetector CT imaging of the chest was performed following the standard protocol without IV contrast. COMPARISON:  Single-view of the chest 12/24/2019. PA and lateral chest 11/18/2019. FINDINGS: Cardiovascular: Marked cardiomegaly. Calcific aortic and coronary atherosclerosis. No aneurysm. No pericardial effusion. Mediastinum/Nodes: No enlarged mediastinal or axillary lymph nodes. Thyroid gland, trachea, and esophagus demonstrate no  significant findings. Lungs/Pleura: The patient has small to moderate pleural effusions, larger on the right. There is associated compressive basilar atelectasis. Right worse than left airspace disease is seen in the upper lobes bilaterally. Small focus of airspace opacity is also seen in the right middle lobe. Upper Abdomen: Trace amount of perihepatic ascites noted. Musculoskeletal: No acute or focal abnormality.  Scoliosis noted. IMPRESSION: Bilateral upper lobe and right middle lobe airspace disease has an appearance most consistent with pneumonia. Small to moderate pleural effusions, larger on the right. Cardiomegaly. Trace amount of perihepatic ascites. Aortic Atherosclerosis (ICD10-I70.0). Calcific coronary artery disease also noted Electronically Signed   By: Inge Rise M.D.   On: 12/26/2019 14:51   DG Chest Port 1 View  Result Date: 12/24/2019 CLINICAL DATA:  COVID-19 positivity with cough and chest congestion EXAM: PORTABLE CHEST 1 VIEW COMPARISON:  11/18/2018 FINDINGS: Cardiac shadow is enlarged but stable. Aortic calcifications are again seen. Patchy airspace opacity is noted in the right apex new from the prior exam consistent with the given clinical history. Patchy bibasilar opacities are again seen. No acute bony abnormality is noted. IMPRESSION: New right upper lobe airspace opacity consistent with the given clinical history. Electronically Signed   By: Inez Catalina M.D.   On: 12/24/2019 18:31   US Abdomen Limited RUQ  Result Date: 12/26/2019 CLINICAL DATA:  Transaminitis EXAM: ULTRASOUND ABDOMEN LIMITED RIGHT UPPER QUADRANT COMPARISON:  04/25/2019 FINDINGS: Gallbladder: There is pericholecystic free fluid with mild gallbladder wall thickening with the gallbladder wall measuring approximately 3 mm. There are no gallstones. The sonographic Percell Miller sign is reported as negative. Common bile duct: Diameter: 3 mm Liver: The liver parenchyma is coarsened and heterogeneous. Liver surface  appears somewhat nodular. Portal vein is patent on color Doppler imaging with normal direction of blood flow towards the liver. Other: There is a large right-sided pleural effusion. There is a small volume of ascites in the upper abdomen. IMPRESSION: 1. Gallbladder wall thickening without cholelithiasis or definite sonographic evidence for acute cholecystitis. Gallbladder wall thickening is a nonspecific finding and can be seen in patients with ascites or heart failure. 2. Incidentally noted large right-sided pleural effusion. 3. Small volume ascites. 4. Again noted is a coarsened and heterogeneous appearance of the liver parenchyma consistent with underlying hepatocellular disease. Electronically Signed   By: Constance Holster M.D.   On: 12/26/2019 01:45    Phillips Climes M.D on 12/29/2019 at 1:36 PM  To page go to [email or web address]

## 2019-12-30 ENCOUNTER — Inpatient Hospital Stay (HOSPITAL_COMMUNITY): Payer: Medicare Other

## 2019-12-30 LAB — COMPREHENSIVE METABOLIC PANEL
ALT: 68 U/L — ABNORMAL HIGH (ref 0–44)
AST: 49 U/L — ABNORMAL HIGH (ref 15–41)
Albumin: 2 g/dL — ABNORMAL LOW (ref 3.5–5.0)
Alkaline Phosphatase: 116 U/L (ref 38–126)
Anion gap: 7 (ref 5–15)
BUN: 33 mg/dL — ABNORMAL HIGH (ref 8–23)
CO2: 42 mmol/L — ABNORMAL HIGH (ref 22–32)
Calcium: 8.2 mg/dL — ABNORMAL LOW (ref 8.9–10.3)
Chloride: 99 mmol/L (ref 98–111)
Creatinine, Ser: 0.75 mg/dL (ref 0.44–1.00)
GFR, Estimated: >60 mL/min (ref >60)
Glucose, Bld: 207 mg/dL — ABNORMAL HIGH (ref 70–99)
Potassium: 3.4 mmol/L — ABNORMAL LOW (ref 3.5–5.1)
Sodium: 148 mmol/L — ABNORMAL HIGH (ref 135–145)
Total Bilirubin: 0.6 mg/dL (ref 0.3–1.2)
Total Protein: 5.3 g/dL — ABNORMAL LOW (ref 6.5–8.1)

## 2019-12-30 LAB — CBC
HCT: 38.9 % (ref 36.0–46.0)
Hemoglobin: 12.3 g/dL (ref 12.0–15.0)
MCH: 29.2 pg (ref 26.0–34.0)
MCHC: 31.6 g/dL (ref 30.0–36.0)
MCV: 92.4 fL (ref 80.0–100.0)
Platelets: 168 10*3/uL (ref 150–400)
RBC: 4.21 MIL/uL (ref 3.87–5.11)
RDW: 18.1 % — ABNORMAL HIGH (ref 11.5–15.5)
WBC: 9.4 10*3/uL (ref 4.0–10.5)
nRBC: 0 % (ref 0.0–0.2)

## 2019-12-30 LAB — HEMOGLOBIN A1C
Hgb A1c MFr Bld: 6.7 % — ABNORMAL HIGH (ref 4.8–5.6)
Mean Plasma Glucose: 145.59 mg/dL

## 2019-12-30 LAB — GLUCOSE, CAPILLARY
Glucose-Capillary: 188 mg/dL — ABNORMAL HIGH (ref 70–99)
Glucose-Capillary: 234 mg/dL — ABNORMAL HIGH (ref 70–99)
Glucose-Capillary: 92 mg/dL (ref 70–99)

## 2019-12-30 MED ORDER — APIXABAN 2.5 MG PO TABS
2.5000 mg | ORAL_TABLET | Freq: Two times a day (BID) | ORAL | Status: DC
  Administered 2019-12-30 – 2020-01-01 (×6): 2.5 mg via ORAL
  Filled 2019-12-30 (×6): qty 1

## 2019-12-30 MED ORDER — MAGNESIUM HYDROXIDE 400 MG/5ML PO SUSP
15.0000 mL | Freq: Once | ORAL | Status: AC
  Administered 2019-12-30: 15 mL via ORAL

## 2019-12-30 MED ORDER — APIXABAN 2.5 MG PO TABS: 2.5000 mg | ORAL_TABLET | Freq: Two times a day (BID) | ORAL | Status: DC

## 2019-12-30 MED ORDER — POTASSIUM CHLORIDE CRYS ER 20 MEQ PO TBCR
30.0000 meq | EXTENDED_RELEASE_TABLET | ORAL | Status: AC
  Administered 2019-12-30 (×2): 30 meq via ORAL
  Filled 2019-12-30 (×2): qty 1

## 2019-12-30 MED ORDER — BISACODYL 5 MG PO TBEC
10.0000 mg | DELAYED_RELEASE_TABLET | Freq: Once | ORAL | Status: AC
  Administered 2019-12-30: 10 mg via ORAL
  Filled 2019-12-30: qty 2

## 2019-12-30 MED ORDER — INSULIN ASPART 100 UNIT/ML ~~LOC~~ SOLN
0.0000 [IU] | Freq: Three times a day (TID) | SUBCUTANEOUS | Status: DC
  Administered 2019-12-30: 3 [IU] via SUBCUTANEOUS
  Administered 2019-12-30 – 2019-12-31 (×3): 2 [IU] via SUBCUTANEOUS
  Administered 2019-12-31: 3 [IU] via SUBCUTANEOUS
  Administered 2020-01-01 (×2): 1 [IU] via SUBCUTANEOUS

## 2019-12-30 NOTE — Progress Notes (Signed)
PROGRESS NOTE                                                                                                                                                                                                             Patient Demographics:    Shirley Bates, is a 86 y.o. female, DOB - 1925-08-17, VVO:[id number]  Admit date - 12/24/2019   Admitting Physician Rise Patience, MD  Outpatient Primary MD for the patient is Kurth-Bowen, Cornelia, PA-C  LOS - 6  Chief Complaint  Patient presents with  . Covid Positive  . Cough  . Shortness of Breath       Brief Narrative    Shirley Bates is a 86 y.o. female with history of cardiomyopathy, A. fib, hypertension, severe dementia was found to be Covid positive yesterday.  As per the patient's daughter one of the residents at the living facility was tested positive last week and subsequently all the residents were being checked alternatingly every other day for Covid test.  Patient turned out to be Covid positive yesterday but was asymptomatic.  But since this morning patient was getting more febrile and was brought to the ER.   Subjective:    Shirley Bates today with no significant events overnight, she had a good night sleep, appetite has been good as well.  .   Assessment  & Plan :    Acute respiratory failure with hypoxia secondary to COVID-19 infection  -He has improved oxygen requirement, this morning she is on 2 L of oxygen nasal cannula.  Which is back to her baseline -Continue with IV steroids . -Continue with IV remdesivir . -Borderline procalcitonin, possible bacterial pneumonia, treated with IV Unasyn. - due to her age she is tenuous, will encourage her to sit up in chair use I-S and flutter valve for pulmonary toiletry, advance activity and titrate down oxygen as much as we can.    Recent Labs  Lab 12/24/19 1737 12/24/19 1737 12/24/19 1739 12/24/19 1839 12/25/19 0047 12/25/19 0047 12/26/19 0339  12/26/19 0348 12/27/19 0423 12/28/19 0300 12/29/19 0212 12/30/19 0235  WBC 4.0   < >  --   --  3.1*   < > 3.9*  --  7.5 9.5 8.3 9.4  CRP 6.1*   < >  --   --  8.1*  --  6.0*  --  4.2* 3.4* 2.0*  --   DDIMER 1.18*   < >  --   --  1.25*  --  1.05*  --  1.15* 1.37* 1.77*  --   BNP  --   --   --   --  843.9*  --   --  1,160.1* 1,008.4* 1,579.5* 2,256.7*  --   PROCALCITON 0.15  --   --   --   --   --   --   --   --   --   --   --   LATICACIDVEN  --   --  1.0  --   --   --   --   --   --   --   --   --   AST 214*   < >  --   --  248*   < > 143*  --  104* 76* 60* 49*  ALT 100*   < >  --   --  137*   < > 115*  --  110* 97* 82* 68*  ALKPHOS 166*   < >  --   --  186*   < > 150*  --  145* 139* 130* 116  BILITOT 0.8   < >  --   --  1.0   < > 0.4  --  0.7 0.7 0.8 0.6  ALBUMIN 2.0*   < >  --   --  2.5*   < > 2.1*  --  2.5* 2.4* 2.3* 2.0*  SARSCOV2NAA  --   --   --  POSITIVE*  --   --   --   --   --   --   --   --    < > = values in this interval not displayed.    Markedly elevated LFTs  -Secondary to COVID-19 pneumonia, with known underlining chronic liver disease/cirrhosis at baseline, as discussed with daughter, they are aware of this diagnosis, decision was made for no further work-up at this point given her advanced age. -Trending down, continue to monitor closely  A. fib with RVR -Heart rate uncontrolled overnight, continue with beta-blockers, continue with amiodarone, she required IV digoxin x1. -She is on Eliquis for anticoagulation.  Right Pleural  effusion -Most likely due to CHF/and liver cirrhosis, significant amount, discussed with IR, they are unable to perform thoracentesis, PCCM were consulted, continue with aggressive diuresis for now, and hold Eliquis for possible need of thoracentesis if no improvement with IV diuresis.  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   -Blood pressure and heart rate  very tenuous, so she is on her home dose medication 20 mg of Lasix every other day, which she has been kept. -She is on Lasix as needed, she has been receiving IV Lasix daily, will hold today given hyponatremia. -We will hold her ARB given worsening creatinine .  Hypernatremia -It was 147 today, IV Lasix on hold today, discussed with PCCM, likely will benefit from some metolazone diuresis . sodium of 147, this is due to diuresis, she was encouraged to increase her free water intake.  Hypokalemia -Repleted  AKI -Creatinine is up to 1.27, have stopped losartan .  Severe dementia.  No acute issues.   Hospital delirium -Patient with significant hospital delirium, requiring frequent Haldol dosing, she is currently on Seroquel nightly.    Condition - Extremely Guarded  Family Communication  :  Daughter [phone] updated daily, most recent 10/10 Code Status :  DNR  Consults  :  None  Procedures  :  None  PUD Prophylaxis : PPI  Disposition Plan  :    Status is: Inpatient  Remains inpatient appropriate because:IV treatments appropriate due to intensity of illness or inability to take PO   Dispo: The patient is from: SNF              Anticipated d/c is to: SNF              Anticipated d/c date is: 2 days              Patient currently is not medically stable to d/c.    DVT Prophylaxis  :  Eliquis on hold for possible thoracentesis.  Lab Results  Component Value Date   PLT 168 12/30/2019    Diet :  Diet Order            DIET DYS 3 Room service appropriate? Yes; Fluid consistency: Thin  Diet effective now                  Inpatient Medications Scheduled Meds: . (feeding supplement) PROSource Plus  30 mL Oral BID BM  . amiodarone  200 mg Oral Daily  . feeding supplement (ENSURE ENLIVE)  237 mL Oral BID BM  . ferrous sulfate  325 mg Oral Q breakfast  . furosemide  20 mg Oral Q M,W,F  . insulin aspart  0-9 Units Subcutaneous TID WC  . methylPREDNISolone  (SOLU-MEDROL) injection  60 mg Intravenous Q12H  . metoprolol succinate  100 mg Oral Daily  . multivitamin with minerals  1 tablet Oral Daily  . mupirocin ointment  1 application Nasal BID  . pantoprazole  40 mg Oral Daily  . QUEtiapine  25 mg Oral QHS  . simvastatin  20 mg Oral QHS   Continuous Infusions:  PRN Meds:.acetaminophen **OR** [DISCONTINUED] acetaminophen, chlorpheniramine-HYDROcodone, guaiFENesin-dextromethorphan, [DISCONTINUED] ondansetron **OR** ondansetron (ZOFRAN) IV  Antibiotics  :   Anti-infectives (From admission, onward)   Start     Dose/Rate Route Frequency Ordered Stop   12/25/19 1015  Ampicillin-Sulbactam (UNASYN) 3 g in sodium chloride 0.9 % 100 mL IVPB        3 g 200 mL/hr over 30 Minutes Intravenous Every 12 hours 12/25/19 1008 12/29/19 2203   12/25/19 1000  remdesivir 100 mg in sodium chloride 0.9 % 100 mL IVPB       "Followed by" Linked Group Details   100 mg 200 mL/hr over 30 Minutes Intravenous Daily 12/24/19 2133 12/28/19 1923   12/25/19 0600  doxycycline (VIBRAMYCIN) 100 mg in sodium chloride 0.9 % 250 mL IVPB  Status:  Discontinued        100 mg 125 mL/hr over 120 Minutes Intravenous Every 12 hours 12/25/19 0534 12/25/19 0957   12/24/19 2200  remdesivir 200 mg in sodium chloride 0.9% 250 mL IVPB       "Followed by" Linked Group Details   200 mg 580 mL/hr over 30 Minutes Intravenous Once 12/24/19 2133 12/25/19 0903          Objective:   Vitals:   12/29/19 1611 12/29/19 2051 12/30/19 0500 12/30/19 0731  BP: 135/77 130/65  (!) 157/87  Pulse: 72 89  90  Resp: 16 17  17   Temp: 97.7 F (36.5 C) 97.8 F (36.6 C)  (!) 97.4 F (36.3 C)  TempSrc:  Axillary  Oral  SpO2: 98% 98%  100%  Weight:   52.3 kg     SpO2: 100 % O2 Flow Rate (L/min): 2  L/min  Wt Readings from Last 3 Encounters:  12/30/19 52.3 kg  12/03/19 58.1 kg  11/19/19 56.7 kg     Intake/Output Summary (Last 24 hours) at 12/30/2019 1233 Last data filed at 12/30/2019  1021 Gross per 24 hour  Intake 30 ml  Output 750 ml  Net -720 ml     Physical Exam  Awake Alert, pleasantly demented, frail, deconditioned  Diminished air entry air entry at the bases, right left Irregular irregular +ve B.Sounds, Abd Soft, No tenderness, No rebound - guarding or rigidity. No Cyanosis, Clubbing or edema, No new Rash or bruise     Pressure Injury 12/25/19 Coccyx Medial Stage 2 -  Partial thickness loss of dermis presenting as a shallow open injury with a red, pink wound bed without slough. (Active)  12/25/19 0100  Location: Coccyx  Location Orientation: Medial  Staging: Stage 2 -  Partial thickness loss of dermis presenting as a shallow open injury with a red, pink wound bed without slough.  Wound Description (Comments):   Present on Admission: Yes     Data Review:   Recent Labs  Lab 12/25/19 0047 12/25/19 0047 12/26/19 0339 12/27/19 0423 12/28/19 0300 12/29/19 0212 12/30/19 0235  WBC 3.1*   < > 3.9* 7.5 9.5 8.3 9.4  HGB 11.0*   < > 12.2 13.0 13.2 13.2 12.3  HCT 35.2*   < > 38.0 39.7 40.5 40.7 38.9  PLT 179   < > 152 211 220 181 168  MCV 93.6   < > 91.8 91.7 92.0 91.7 92.4  MCH 29.3   < > 29.5 30.0 30.0 29.7 29.2  MCHC 31.3   < > 32.1 32.7 32.6 32.4 31.6  RDW 18.8*   < > 18.5* 18.5* 18.4* 18.4* 18.1*  LYMPHSABS 0.1*  --  0.4* 0.3* 0.3* 0.3*  --   MONOABS 0.1  --  0.2 0.3 0.4 0.4  --   EOSABS 0.0  --  0.0 0.0 0.0 0.0  --   BASOSABS 0.0  --  0.0 0.0 0.0 0.0  --    < > = values in this interval not displayed.    Recent Labs  Lab 12/24/19 1737 12/24/19 1737 12/24/19 1739 12/25/19 0047 12/25/19 0047 12/25/19 0841 12/26/19 0339 12/26/19 0348 12/27/19 0423 12/28/19 0300 12/29/19 0212 12/29/19 0636 12/30/19 0235 12/30/19 0809  NA 138   < >  --  139   < >  --  142  --  142 145 147*  --  148*  --   K 3.2*   < >  --  4.6   < >  --  3.8  --  3.6 3.5 3.1*  --  3.4*  --   CL 107   < >  --  101   < >  --  104  --  102 103 97*  --  99  --   CO2 23    < >  --  29   < >  --  28  --  25 30 40*  --  42*  --   GLUCOSE 115*   < >  --  146*   < >  --  175*  --  204* 192* 203*  --  207*  --   BUN 20   < >  --  26*   < >  --  37*  --  41* 42* 34*  --  33*  --   CREATININE 0.78   < >  --  0.95   < >  --  0.90  --  1.27* 0.92 0.93  --  0.75  --   CALCIUM 7.0*   < >  --  8.1*   < >  --  7.9*  --  8.0* 8.3* 8.0*  --  8.2*  --   AST 214*   < >  --  248*   < >  --  143*  --  104* 76* 60*  --  49*  --   ALT 100*   < >  --  137*   < >  --  115*  --  110* 97* 82*  --  68*  --   ALKPHOS 166*   < >  --  186*   < >  --  150*  --  145* 139* 130*  --  116  --   BILITOT 0.8   < >  --  1.0   < >  --  0.4  --  0.7 0.7 0.8  --  0.6  --   ALBUMIN 2.0*   < >  --  2.5*   < >  --  2.1*  --  2.5* 2.4* 2.3*  --  2.0*  --   MG  --   --   --   --   --  2.1  --   --   --   --   --  2.0  --   --   CRP 6.1*   < >  --  8.1*  --   --  6.0*  --  4.2* 3.4* 2.0*  --   --   --   DDIMER 1.18*   < >  --  1.25*  --   --  1.05*  --  1.15* 1.37* 1.77*  --   --   --   PROCALCITON 0.15  --   --   --   --   --   --   --   --   --   --   --   --   --   LATICACIDVEN  --   --  1.0  --   --   --   --   --   --   --   --   --   --   --   HGBA1C  --   --   --   --   --   --   --   --   --   --   --   --   --  6.7*  BNP  --   --   --  843.9*  --   --   --  1,160.1* 1,008.4* 1,579.5* 2,256.7*  --   --   --    < > = values in this interval not displayed.    Recent Labs  Lab 12/24/19 1737 12/24/19 1737 12/24/19 1839 12/25/19 0047 12/26/19 0339 12/26/19 0348 12/27/19 0423 12/28/19 0300 12/29/19 0212  CRP 6.1*   < >  --  8.1* 6.0*  --  4.2* 3.4* 2.0*  DDIMER 1.18*   < >  --  1.25* 1.05*  --  1.15* 1.37* 1.77*  BNP  --   --   --  843.9*  --  1,160.1* 1,008.4* 1,579.5* 2,256.7*  PROCALCITON 0.15  --   --   --   --   --   --   --   --   SARSCOV2NAA  --   --  POSITIVE*  --   --   --   --   --   --    < > = values in this interval not displayed.     ------------------------------------------------------------------------------------------------------------------ No results for input(s): CHOL, HDL, LDLCALC, TRIG, CHOLHDL, LDLDIRECT in the last 72 hours.  Lab Results  Component Value Date   HGBA1C 6.7 (H) 12/30/2019   ------------------------------------------------------------------------------------------------------------------ No results for input(s): TSH, T4TOTAL, T3FREE, THYROIDAB in the last 72 hours.  Invalid input(s): FREET3 ------------------------------------------------------------------------------------------------------------------ No results for input(s): VITAMINB12, FOLATE, FERRITIN, TIBC, IRON, RETICCTPCT in the last 72 hours.  Coagulation profile No results for input(s): INR, PROTIME in the last 168 hours.  Recent Labs    12/28/19 0300 12/29/19 0212  DDIMER 1.37* 1.77*    Cardiac Enzymes No results for input(s): CKMB, TROPONINI, MYOGLOBIN in the last 168 hours.  Invalid input(s): CK ------------------------------------------------------------------------------------------------------------------    Component Value Date/Time   BNP 2,256.7 (H) 12/29/2019 8242    Micro Results Recent Results (from the past 240 hour(s))  Blood Culture (routine x 2)     Status: None   Collection Time: 12/24/19  5:37 PM   Specimen: BLOOD LEFT ARM  Result Value Ref Range Status   Specimen Description BLOOD LEFT ARM  Final   Special Requests   Final    BOTTLES DRAWN AEROBIC AND ANAEROBIC Blood Culture adequate volume   Culture   Final    NO GROWTH 5 DAYS Performed at [facility], [address], [place], [place] [address]    Report Status 12/29/2019 FINAL  Final  Blood Culture (routine x 2)     Status: None   Collection Time: 12/24/19  5:51 PM   Specimen: BLOOD RIGHT HAND  Result Value Ref Range Status   Specimen Description BLOOD RIGHT HAND  Final   Special Requests   Final    BOTTLES DRAWN AEROBIC AND  ANAEROBIC Blood Culture adequate volume   Culture   Final    NO GROWTH 5 DAYS Performed at [facility], [place] [address], [place], [place] [address]    Report Status 12/29/2019 FINAL  Final  Respiratory Panel by RT PCR (Flu A&B, Covid) - Nasopharyngeal Swab     Status: Abnormal   Collection Time: 12/24/19  6:39 PM   Specimen: Nasopharyngeal Swab  Result Value Ref Range Status   SARS Coronavirus 2 by RT PCR POSITIVE (A) NEGATIVE Final    Comment: RESULT CALLED TO, READ BACK BY AND VERIFIED WITH: Trudi Ida RN 2030 12/24/19 A BROWNING (NOTE) SARS-CoV-2 target nucleic acids are DETECTED.  SARS-CoV-2 RNA is generally detectable in upper respiratory specimens  during the acute phase of infection. Positive results are indicative of the presence of the identified virus, but do not rule out bacterial infection or co-infection with other pathogens not detected by the test. Clinical correlation with patient history and other diagnostic information is necessary to determine patient infection status. The expected result is Negative.  Fact Sheet for Patients:  PinkCheek.be  Fact Sheet for Healthcare Providers: GravelBags.it  This test is not yet approved or cleared by the [place] FDA and  has been authorized for detection and/or diagnosis of SARS-CoV-2 by FDA under an Emergency Use Authorization (EUA).  This EUA will remain in effect (meaning this test can be  used) for the duration of  the COVID-19 declaration under Section 564(b)(1) of the Act, 21 U.S.C. section 360bbb-3(b)(1), unless the authorization is terminated or revoked sooner.      Influenza  A by PCR NEGATIVE NEGATIVE Final   Influenza B by PCR NEGATIVE NEGATIVE Final    Comment: (NOTE) The Xpert Xpress SARS-CoV-2/FLU/RSV assay is intended as an aid in  the diagnosis of influenza from Nasopharyngeal swab specimens and  should not be used as a sole basis for  treatment. Nasal washings and  aspirates are unacceptable for Xpert Xpress SARS-CoV-2/FLU/RSV  testing.  Fact Sheet for Patients: PinkCheek.be  Fact Sheet for Healthcare Providers: GravelBags.it  This test is not yet approved or cleared by the [place] FDA and  has been authorized for detection and/or diagnosis of SARS-CoV-2 by  FDA under an Emergency Use Authorization (EUA). This EUA will remain  in effect (meaning this test can be used) for the duration of the  Covid-19 declaration under Section 564(b)(1) of the Act, 21  U.S.C. section 360bbb-3(b)(1), unless the authorization is  terminated or revoked. Performed at [facility], [place] [address], [place], [place] [address]   MRSA PCR Screening     Status: Abnormal   Collection Time: 12/25/19 11:38 AM   Specimen: Nasal Mucosa; Nasopharyngeal  Result Value Ref Range Status   MRSA by PCR POSITIVE (A) NEGATIVE Final    Comment:        The GeneXpert MRSA Assay (FDA approved for NASAL specimens only), is one component of a comprehensive MRSA colonization surveillance program. It is not intended to diagnose MRSA infection nor to guide or monitor treatment for MRSA infections. RESULT CALLED TO, READ BACK BY AND VERIFIED WITH: Genella Rife RN 15:30 12/25/19 ([email or web address]) Performed at [facility], [place] [address], [place], [place] [address]   Surgical PCR screen     Status: Abnormal   Collection Time: 12/26/19 11:46 PM   Specimen: Nasal Mucosa; Nasal Swab  Result Value Ref Range Status   MRSA, PCR POSITIVE (A) NEGATIVE Final    Comment: RESULT CALLED TO, READ BACK BY AND VERIFIED WITH: M [facility] RN 12/27/19 0531 JDW    Staphylococcus aureus POSITIVE (A) NEGATIVE Final    Comment: (NOTE) The Xpert SA Assay (FDA approved for NASAL specimens in patients 28 years of age and older), is one component of a comprehensive surveillance program. It is not intended to diagnose  infection nor to guide or monitor treatment. Performed at [facility], [place] [address] [place], [place] [address]     Radiology Reports CT CHEST WO CONTRAST  Result Date: 12/26/2019 CLINICAL DATA:  COVID-19 positive patient with cough and congestion. EXAM: CT CHEST WITHOUT CONTRAST TECHNIQUE: Multidetector CT imaging of the chest was performed following the standard protocol without IV contrast. COMPARISON:  Single-view of the chest 12/24/2019. PA and lateral chest 11/18/2019. FINDINGS: Cardiovascular: Marked cardiomegaly. Calcific aortic and coronary atherosclerosis. No aneurysm. No pericardial effusion. Mediastinum/Nodes: No enlarged mediastinal or axillary lymph nodes. Thyroid gland, trachea, and esophagus demonstrate no significant findings. Lungs/Pleura: The patient has small to moderate pleural effusions, larger on the right. There is associated compressive basilar atelectasis. Right worse than left airspace disease is seen in the upper lobes bilaterally. Small focus of airspace opacity is also seen in the right middle lobe. Upper Abdomen: Trace amount of perihepatic ascites noted. Musculoskeletal: No acute or focal abnormality.  Scoliosis noted. IMPRESSION: Bilateral upper lobe and right middle lobe airspace disease has an appearance most consistent with pneumonia. Small to moderate pleural effusions, larger on the right. Cardiomegaly. Trace amount of perihepatic ascites. Aortic Atherosclerosis (ICD10-I70.0). Calcific coronary artery disease also noted Electronically Signed  By: Inge Rise M.D.   On: 12/26/2019 14:51   DG Chest Port 1 View  Result Date: 12/24/2019 CLINICAL DATA:  COVID-19 positivity with cough and chest congestion EXAM: PORTABLE CHEST 1 VIEW COMPARISON:  11/18/2018 FINDINGS: Cardiac shadow is enlarged but stable. Aortic calcifications are again seen. Patchy airspace opacity is noted in the right apex new from the prior exam consistent with the given clinical  history. Patchy bibasilar opacities are again seen. No acute bony abnormality is noted. IMPRESSION: New right upper lobe airspace opacity consistent with the given clinical history. Electronically Signed   By: Inez Catalina M.D.   On: 12/24/2019 18:31   US Abdomen Limited RUQ  Result Date: 12/26/2019 CLINICAL DATA:  Transaminitis EXAM: ULTRASOUND ABDOMEN LIMITED RIGHT UPPER QUADRANT COMPARISON:  04/25/2019 FINDINGS: Gallbladder: There is pericholecystic free fluid with mild gallbladder wall thickening with the gallbladder wall measuring approximately 3 mm. There are no gallstones. The sonographic Percell Miller sign is reported as negative. Common bile duct: Diameter: 3 mm Liver: The liver parenchyma is coarsened and heterogeneous. Liver surface appears somewhat nodular. Portal vein is patent on color Doppler imaging with normal direction of blood flow towards the liver. Other: There is a large right-sided pleural effusion. There is a small volume of ascites in the upper abdomen. IMPRESSION: 1. Gallbladder wall thickening without cholelithiasis or definite sonographic evidence for acute cholecystitis. Gallbladder wall thickening is a nonspecific finding and can be seen in patients with ascites or heart failure. 2. Incidentally noted large right-sided pleural effusion. 3. Small volume ascites. 4. Again noted is a coarsened and heterogeneous appearance of the liver parenchyma consistent with underlying hepatocellular disease. Electronically Signed   By: Constance Holster M.D.   On: 12/26/2019 01:45    Phillips Climes M.D on 12/30/2019 at 12:33 PM  To page go to [email or web address]

## 2019-12-30 NOTE — Progress Notes (Signed)
NAME:  Shirley Bates, MRN:  [id number], DOB:  [birth year], LOS: 6 ADMISSION DATE:  12/24/2019, CONSULTATION DATE:  12/30/19 REFERRING MD:  Elgergawy, CHIEF COMPLAINT:  Pleural effusion  Brief History   86 y.o. F admitted 10/4 with Covid-19 PNA.  PMH also significant for HFrEF, atrial fibrillation, HTN, dementia.  She was admitted and has been on [place] oxygen.  CT chest with R pleural effusion.  PCCM consulted for possible thoracentesis.  History of present illness   Shirley Bates is a 86 y.o. F with PMH of atrial fibrillation on Eliquis, cardiomyopathy and HFrEF, cirrhosis, HTN who was admitted 10/4 from assisted living with shortness of breath and covid-19 PNA, has received two doses Pfizer Covid vaccine.  She required 4L [place] above baseline 2-3.   CXR with bilateral pleural effusions R>L.   CT chest was obtained and showed bilateral upper and RML PNA with moderate R pleural effusion.  Most recent Echo in 09/2019 with EF of 30-35%.  She has been receiving Lasix IV and po, though had borderline low BP's and received 20mg  po only today. She is on Eliquis for atrial fibrillation, this was held 10/7 for possible IR thora, IR unable to do the procedure as she is Covid +, Eliquis resumed morning of 10/8.  Past Medical History   has a past medical history of Atrial fibrillation status post cardioversion [facility]) (10/12/2019), Cancer ([place]), Hyperlipidemia, and Hypertension.   Significant Hospital Events   10/4 admit to internal medicine   Consults:  PCCM  Procedures:    Significant Diagnostic Tests:  10/6 CT chest w/o contrast>>Bilateral upper lobe and right middle lobe airspace disease has an appearance most consistent with pneumonia. Small to moderate pleural effusions, larger on the right.  Micro Data:  10/4 Covid-19>>positive 10/4 Flu>>negative 10/5 MRSA screen>>positive 10/4 BCx2>>NG at 4 days   Antimicrobials:   Unasyn 10/5- Doxycycline 10/5 only Remdesevir 10/4-  Interim  history/subjective:   Patient has diuresed well over the past 2 days and is back to her baseline 2L of oxygen.   Chest radiograph shows improvement in bilateral pleural effusions  Objective   Blood pressure (!) 157/87, pulse 90, temperature (!) 97.4 F (36.3 C), temperature source Oral, resp. rate 17, weight 52.3 kg, SpO2 100 %.        Intake/Output Summary (Last 24 hours) at 12/30/2019 1516 Last data filed at 12/30/2019 1405 Gross per 24 hour  Intake 45 ml  Output 750 ml  Net -705 ml   Filed Weights   12/28/19 0559 12/29/19 0629 12/30/19 0500  Weight: 56.9 kg 52 kg 52.3 kg   General:  Elderly woman, thin, frail, in no distress HEENT: MM pink/dry Neuro: somnolent CV: s1s2 rrr, no m/r/g PULM:  No wheezing, no hypoxia or accessory use, mild rales in the bases GI: soft, non-tender bsx4 active  Extremities: warm/dry, no edema  Skin: no rashes or lesions   Resolved Hospital Problem list     Assessment & Plan:   Bilateral Pleural Effusions R>L in the setting of chronic HF, cirrhosis and Covid-19 PNA -Her bilateral effusions are likely secondary to her underlying heart failure and cirrhosis. She has had improvement in her O2 from 4L back to her baseline of 2L  - Given that these effusions appear to be related to volume overload, she has benefited from diuresis and would not benefit further from thoracentesis. She is also elderly and frail so the risks of the procedure outweigh any potential benefit.   Freda Jackson, MD [facility] Office: [phone]   See Amion for Pager Details    Labs   CBC: Recent Labs  Lab 12/25/19 0047 12/25/19 0047 12/26/19 0339 12/27/19 0423 12/28/19 0300 12/29/19 0212 12/30/19 0235  WBC 3.1*   < > 3.9* 7.5 9.5 8.3 9.4  NEUTROABS 2.9  --  3.3 6.8 8.8* 7.6  --   HGB 11.0*   < > 12.2 13.0 13.2 13.2 12.3  HCT 35.2*   < > 38.0 39.7 40.5 40.7 38.9  MCV 93.6   < > 91.8 91.7 92.0 91.7 92.4  PLT 179   < > 152 211 220  181 168   < > = values in this interval not displayed.    Basic Metabolic Panel: Recent Labs  Lab 12/25/19 0047 12/25/19 0841 12/26/19 0339 12/27/19 0423 12/28/19 0300 12/29/19 0212 12/29/19 0636 12/30/19 0235  NA   < >  --  142 142 145 147*  --  148*  K   < >  --  3.8 3.6 3.5 3.1*  --  3.4*  CL   < >  --  104 102 103 97*  --  99  CO2   < >  --  28 25 30  40*  --  42*  GLUCOSE   < >  --  175* 204* 192* 203*  --  207*  BUN   < >  --  37* 41* 42* 34*  --  33*  CREATININE   < >  --  0.90 1.27* 0.92 0.93  --  0.75  CALCIUM   < >  --  7.9* 8.0* 8.3* 8.0*  --  8.2*  MG  --  2.1  --   --   --   --  2.0  --    < > = values in this interval not displayed.   GFR: Estimated Creatinine Clearance: 35.5 mL/min (by C-G formula based on SCr of 0.75 mg/dL). Recent Labs  Lab 12/24/19 1737 12/24/19 1739 12/25/19 0047 12/27/19 0423 12/28/19 0300 12/29/19 0212 12/30/19 0235  PROCALCITON 0.15  --   --   --   --   --   --   WBC 4.0  --    < > 7.5 9.5 8.3 9.4  LATICACIDVEN  --  1.0  --   --   --   --   --    < > = values in this interval not displayed.    Liver Function Tests: Recent Labs  Lab 12/26/19 0339 12/27/19 0423 12/28/19 0300 12/29/19 0212 12/30/19 0235  AST 143* 104* 76* 60* 49*  ALT 115* 110* 97* 82* 68*  ALKPHOS 150* 145* 139* 130* 116  BILITOT 0.4 0.7 0.7 0.8 0.6  PROT 5.2* 6.0* 5.8* 5.6* 5.3*  ALBUMIN 2.1* 2.5* 2.4* 2.3* 2.0*   No results for input(s): LIPASE, AMYLASE in the last 168 hours. No results for input(s): AMMONIA in the last 168 hours.  ABG No results found for: PHART, PCO2ART, PO2ART, HCO3, TCO2, ACIDBASEDEF, O2SAT   Coagulation Profile: No results for input(s): INR, PROTIME in the last 168 hours.  Cardiac Enzymes: No results for input(s): CKTOTAL, CKMB, CKMBINDEX, TROPONINI in the last 168 hours.  HbA1C: Hgb A1c MFr Bld  Date/Time Value Ref Range Status  12/30/2019 08:09 AM 6.7 (H) 4.8 - 5.6 % Final    Comment:    (NOTE) Pre diabetes:           5.7%-6.4%  Diabetes:              >  6.4%  Glycemic control for   <7.0% adults with diabetes   04/30/2014 09:48 AM 5.8 (H) <5.7 % Final    Comment:                                                                           According to the ADA Clinical Practice Recommendations for 2011, when HbA1c is used as a screening test:     >=6.5%   Diagnostic of Diabetes Mellitus            (if abnormal result is confirmed)   5.7-6.4%   Increased risk of developing Diabetes Mellitus   References:Diagnosis and Classification of Diabetes Mellitus,Diabetes LTEI,3539,12(QZYTM 1):S62-S69 and Standards of Medical Care in         Diabetes - 2011,Diabetes Care,2011,34 (Suppl 1):S11-S61.       CBG: Recent Labs  Lab 12/30/19 1204  GLUCAP 188*

## 2019-12-30 NOTE — Progress Notes (Signed)
ANTICOAGULATION CONSULT NOTE - Initial Consult  Pharmacy Consult for apixaban Indication: atrial fibrillation  No Known Allergies  Patient Measurements: Weight: 52.3 kg (115 lb 4.8 oz)   Vital Signs: Temp: 97.4 F (36.3 C) (10/10 0731) Temp Source: Oral (10/10 0731) BP: 157/87 (10/10 0731) Pulse Rate: 90 (10/10 0731)  Labs: Recent Labs    12/28/19 0300 12/28/19 0300 12/29/19 0212 12/30/19 0235  HGB 13.2   < > 13.2 12.3  HCT 40.5  --  40.7 38.9  PLT 220  --  181 168  CREATININE 0.92  --  0.93 0.75   < > = values in this interval not displayed.    Estimated Creatinine Clearance: 35.5 mL/min (by C-G formula based on SCr of 0.75 mg/dL).   Medical History: Past Medical History:  Diagnosis Date  . Atrial fibrillation status post cardioversion ([place]) 10/12/2019   With recurrent A. fib post cardioversion (was placed on amiodarone and had TEE cardioversion)  . Cancer ([place])   . Hyperlipidemia   . Hypertension     Assessment: 86 year old female with dementia found to be COVID positive. PTA patient was taking Eliquis 2.5mg  by mouth twice daily. Eliquis was held inpatient from 10/8 PM until 10/10 for possible thoracentesis. Per MD, thoracentesis was deferred for now. Therefore, Eliquis will be restarted.  Patient is currently in afib with stable CBC. Hgb 12.3 and Plt 168.   Pt is 86 years old, weight 52.3kg, and Scr 0.75 and therefore qualifies for reduced dose Eliqiuis.  Goal of Therapy:  Monitor platelets by anticoagulation protocol: Yes   Plan:  Restart Eliquis 2.5 mg by mouth twice daily today at 1400  Monitor patient CBC, s/sx bleeding, weight/renal function.   Wilson Singer, PharmD PGY1 Pharmacy Resident 12/30/2019 1:59 PM

## 2019-12-31 LAB — CBC
HCT: 40.9 % (ref 36.0–46.0)
Hemoglobin: 13.1 g/dL (ref 12.0–15.0)
MCH: 29.5 pg (ref 26.0–34.0)
MCHC: 32 g/dL (ref 30.0–36.0)
MCV: 92.1 fL (ref 80.0–100.0)
Platelets: 193 10*3/uL (ref 150–400)
RBC: 4.44 MIL/uL (ref 3.87–5.11)
RDW: 18.3 % — ABNORMAL HIGH (ref 11.5–15.5)
WBC: 10.9 10*3/uL — ABNORMAL HIGH (ref 4.0–10.5)
nRBC: 0 % (ref 0.0–0.2)

## 2019-12-31 LAB — GLUCOSE, CAPILLARY
Glucose-Capillary: 172 mg/dL — ABNORMAL HIGH (ref 70–99)
Glucose-Capillary: 227 mg/dL — ABNORMAL HIGH (ref 70–99)
Glucose-Capillary: 167 mg/dL — ABNORMAL HIGH (ref 70–99)
Glucose-Capillary: 236 mg/dL — ABNORMAL HIGH (ref 70–99)

## 2019-12-31 LAB — COMPREHENSIVE METABOLIC PANEL
ALT: 62 U/L — ABNORMAL HIGH (ref 0–44)
AST: 41 U/L (ref 15–41)
Albumin: 2.1 g/dL — ABNORMAL LOW (ref 3.5–5.0)
Alkaline Phosphatase: 116 U/L (ref 38–126)
Anion gap: 8 (ref 5–15)
BUN: 35 mg/dL — ABNORMAL HIGH (ref 8–23)
CO2: 41 mmol/L — ABNORMAL HIGH (ref 22–32)
Calcium: 8.5 mg/dL — ABNORMAL LOW (ref 8.9–10.3)
Chloride: 99 mmol/L (ref 98–111)
Creatinine, Ser: 0.64 mg/dL (ref 0.44–1.00)
GFR, Estimated: >60 mL/min (ref >60)
Glucose, Bld: 181 mg/dL — ABNORMAL HIGH (ref 70–99)
Potassium: 3.6 mmol/L (ref 3.5–5.1)
Sodium: 148 mmol/L — ABNORMAL HIGH (ref 135–145)
Total Bilirubin: 0.7 mg/dL (ref 0.3–1.2)
Total Protein: 5.3 g/dL — ABNORMAL LOW (ref 6.5–8.1)

## 2019-12-31 MED ORDER — QUETIAPINE FUMARATE 25 MG PO TABS
12.5000 mg | ORAL_TABLET | Freq: Every day | ORAL | Status: DC
  Administered 2019-12-31 – 2020-01-01 (×2): 12.5 mg via ORAL
  Filled 2019-12-31 (×2): qty 1

## 2019-12-31 MED ORDER — CHLORHEXIDINE GLUCONATE CLOTH 2 % EX PADS
6.0000 | MEDICATED_PAD | Freq: Every day | CUTANEOUS | Status: DC
  Administered 2019-12-31 – 2020-01-01 (×2): 6 via TOPICAL

## 2019-12-31 MED ORDER — METHYLPREDNISOLONE SODIUM SUCC 40 MG IJ SOLR
40.0000 mg | Freq: Every day | INTRAMUSCULAR | Status: DC
  Administered 2019-12-31 – 2020-01-01 (×2): 40 mg via INTRAVENOUS
  Filled 2019-12-31 (×2): qty 1

## 2019-12-31 MED ORDER — DEXTROSE 5 % IV SOLN: INTRAVENOUS | Status: DC

## 2019-12-31 NOTE — Progress Notes (Addendum)
PROGRESS NOTE                                                                                                                                                                                                             Patient Demographics:    Shirley Bates, is a 86 y.o. female, DOB - [birth year], LNL:[id number]  Admit date - 12/24/2019   Admitting Physician Rise Patience, MD  Outpatient Primary MD for the patient is Kurth-Bowen, Cornelia, PA-C  LOS - 7  Chief Complaint  Patient presents with  . Covid Positive  . Cough  . Shortness of Breath       Brief Narrative    Shirley Bates is a 86 y.o. female with history of cardiomyopathy, A. fib, hypertension, severe dementia was found to be Covid positive yesterday.  As per the patient's daughter one of the residents at the living facility was tested positive last week and subsequently all the residents were being checked alternatingly every other day for Covid test.  Patient turned out to be Covid positive yesterday but was asymptomatic.  But since this morning patient was getting more febrile and was brought to the ER.   Subjective:    Shirley Bates today with no significant events overnight, patient had good appetite yesterday, and with breakfast .    Assessment  & Plan :    Acute respiratory failure with hypoxia secondary to COVID-19 infection  -He has improved oxygen requirement, this morning she is on 2 L of oxygen nasal cannula.  Which is back to her baseline -Continue with IV steroids . -Continue with IV remdesivir . -Borderline procalcitonin, possible bacterial pneumonia, so she was treated with IV Unasyn. - due to her age she is tenuous, will encourage her to sit up in chair use I-S and flutter valve for pulmonary toiletry, advance activity and titrate down oxygen as much as we can.    Recent Labs  Lab 12/24/19 1737 12/24/19 1737 12/24/19 1739 12/24/19 1839 12/25/19 0047 12/25/19 0047 12/26/19 0339  12/26/19 0339 12/26/19 0348 12/27/19 0423 12/28/19 0300 12/29/19 0212 12/30/19 0235 12/31/19 0443  WBC 4.0   < >  --   --  3.1*   < > 3.9*   < >  --  7.5 9.5 8.3 9.4 10.9*  CRP 6.1*   < >  --   --  8.1*  --  6.0*  --   --  4.2* 3.4* 2.0*  --   --   DDIMER  1.18*   < >  --   --  1.25*  --  1.05*  --   --  1.15* 1.37* 1.77*  --   --   BNP  --   --   --   --  843.9*  --   --   --  1,160.1* 1,008.4* 1,579.5* 2,256.7*  --   --   PROCALCITON 0.15  --   --   --   --   --   --   --   --   --   --   --   --   --   LATICACIDVEN  --   --  1.0  --   --   --   --   --   --   --   --   --   --   --   AST 214*   < >  --   --  248*   < > 143*   < >  --  104* 76* 60* 49* 41  ALT 100*   < >  --   --  137*   < > 115*   < >  --  110* 97* 82* 68* 62*  ALKPHOS 166*   < >  --   --  186*   < > 150*   < >  --  145* 139* 130* 116 116  BILITOT 0.8   < >  --   --  1.0   < > 0.4   < >  --  0.7 0.7 0.8 0.6 0.7  ALBUMIN 2.0*   < >  --   --  2.5*   < > 2.1*   < >  --  2.5* 2.4* 2.3* 2.0* 2.1*  SARSCOV2NAA  --   --   --  POSITIVE*  --   --   --   --   --   --   --   --   --   --    < > = values in this interval not displayed.    Markedly elevated LFTs  -Secondary to COVID-19 pneumonia, with known underlining chronic liver disease/cirrhosis at baseline, as discussed with daughter, they are aware of this diagnosis, decision was made for no further work-up at this point given her advanced age. -Trending down, continue to monitor closely  A. fib with RVR -[place] has trolled is controlled, continue with metoprolol and amiodarone  -She is on Eliquis for anticoagulation.  Right Pleural  effusion -Most likely due to CHF/and liver cirrhosis, have discussed with PCCM, given respiratory status back to baseline, no increased oxygen requirement, and her significant facility, and anticoagulation, recommendation hold on thoracentesis at this point as it risks outweighed benefits.   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   -Blood pressure and heart rate very tenuous, so she is on her home dose medication 20 mg of Lasix every other day, which she has been kept. -She has been receiving IV Lasix as needed.  -We will hold her ARB given worsening creatinine .  Hypernatremia -148 today, holding IV Lasix, will give D5W for few hours today.  Hypokalemia -Repleted  AKI -Creatinine is up to 1.27, have stopped losartan .  Severe dementia.  No acute issues.   Hospital delirium -Patient with significant hospital delirium, requiring frequent Haldol dosing, she is currently on Seroquel nightly.    Condition - Extremely Guarded  Family Communication  :  Daughter [phone] updated daily,  Code Status :  DNR  Consults  :  None  Procedures  :  None  PUD Prophylaxis : PPI  Disposition Plan  :    Status is: Inpatient  Remains inpatient appropriate because:IV treatments appropriate due to intensity of illness or inability to take PO   Dispo: The patient is from: SNF              Anticipated d/c is to: SNF              Anticipated d/c date is: 2 days              Patient currently is not medically stable to d/c.  Hopefully can be discharged to SNF tomorrow if sodium within normal range.    DVT Prophylaxis  :  Eliquis  Lab Results  Component Value Date   PLT 193 12/31/2019    Diet :  Diet Order            DIET DYS 3 Room service appropriate? Yes; Fluid consistency: Thin  Diet effective now                  Inpatient Medications Scheduled Meds: . (feeding supplement) PROSource Plus  30 mL Oral BID BM  . amiodarone  200 mg Oral Daily  . apixaban  2.5 mg Oral BID  . Chlorhexidine Gluconate Cloth  6 each Topical Daily  . feeding supplement (ENSURE ENLIVE)  237 mL Oral BID BM  . ferrous sulfate  325 mg Oral Q breakfast  . furosemide  20 mg Oral Q M,W,F  . insulin aspart  0-9 Units Subcutaneous TID WC  . methylPREDNISolone (SOLU-MEDROL) injection  40 mg  Intravenous Daily  . metoprolol succinate  100 mg Oral Daily  . multivitamin with minerals  1 tablet Oral Daily  . pantoprazole  40 mg Oral Daily  . QUEtiapine  12.5 mg Oral QHS  . simvastatin  20 mg Oral QHS   Continuous Infusions: . dextrose 40 mL/hr at 12/31/19 0930   PRN Meds:.acetaminophen **OR** [DISCONTINUED] acetaminophen, chlorpheniramine-HYDROcodone, guaiFENesin-dextromethorphan, [DISCONTINUED] ondansetron **OR** ondansetron (ZOFRAN) IV  Antibiotics  :   Anti-infectives (From admission, onward)   Start     Dose/Rate Route Frequency Ordered Stop   12/25/19 1015  Ampicillin-Sulbactam (UNASYN) 3 g in sodium chloride 0.9 % 100 mL IVPB        3 g 200 mL/hr over 30 Minutes Intravenous Every 12 hours 12/25/19 1008 12/29/19 2203   12/25/19 1000  remdesivir 100 mg in sodium chloride 0.9 % 100 mL IVPB       "Followed by" Linked Group Details   100 mg 200 mL/hr over 30 Minutes Intravenous Daily 12/24/19 2133 12/28/19 1923   12/25/19 0600  doxycycline (VIBRAMYCIN) 100 mg in sodium chloride 0.9 % 250 mL IVPB  Status:  Discontinued        100 mg 125 mL/hr over 120 Minutes Intravenous Every 12 hours 12/25/19 0534 12/25/19 0957   12/24/19 2200  remdesivir 200 mg in sodium chloride 0.9% 250 mL IVPB       "Followed by" Linked Group Details   200 mg 580 mL/hr over 30 Minutes Intravenous Once 12/24/19 2133 12/25/19 0903          Objective:   Vitals:   12/30/19 1625 12/30/19 2050 12/31/19 0241 12/31/19 0805  BP: 138/68 (!) 141/82  (!) 155/102  Pulse: 79 (!) 102  80  Resp: 16  20  12  Temp: 98.5 F (36.9 C) (!) 97.5 F (36.4 C)  (!) 97.5 F (36.4 C)  TempSrc: Oral     SpO2: 99% 98%  94%  Weight:   52.1 kg     SpO2: 94 % O2 Flow Rate (L/min): 2 L/min  Wt Readings from Last 3 Encounters:  12/31/19 52.1 kg  12/03/19 58.1 kg  11/19/19 56.7 kg    No intake or output data in the 24 hours ending 12/31/19 1542   Physical Exam  Awake, extremely frail, demented, in no  apparent distress Diminished air entry at the bases Irregular irregular  No Cyanosis, Clubbing or edema, No new Rash or bruise   Pressure Injury 12/25/19 Coccyx Medial Stage 2 -  Partial thickness loss of dermis presenting as a shallow open injury with a red, pink wound bed without slough. (Active)  12/25/19 0100  Location: Coccyx  Location Orientation: Medial  Staging: Stage 2 -  Partial thickness loss of dermis presenting as a shallow open injury with a red, pink wound bed without slough.  Wound Description (Comments):   Present on Admission: Yes     Data Review:   Recent Labs  Lab 12/25/19 0047 12/25/19 0047 12/26/19 0339 12/26/19 0339 12/27/19 0423 12/28/19 0300 12/29/19 0212 12/30/19 0235 12/31/19 0443  WBC 3.1*   < > 3.9*   < > 7.5 9.5 8.3 9.4 10.9*  HGB 11.0*   < > 12.2   < > 13.0 13.2 13.2 12.3 13.1  HCT 35.2*   < > 38.0   < > 39.7 40.5 40.7 38.9 40.9  PLT 179   < > 152   < > 211 220 181 168 193  MCV 93.6   < > 91.8   < > 91.7 92.0 91.7 92.4 92.1  MCH 29.3   < > 29.5   < > 30.0 30.0 29.7 29.2 29.5  MCHC 31.3   < > 32.1   < > 32.7 32.6 32.4 31.6 32.0  RDW 18.8*   < > 18.5*   < > 18.5* 18.4* 18.4* 18.1* 18.3*  LYMPHSABS 0.1*  --  0.4*  --  0.3* 0.3* 0.3*  --   --   MONOABS 0.1  --  0.2  --  0.3 0.4 0.4  --   --   EOSABS 0.0  --  0.0  --  0.0 0.0 0.0  --   --   BASOSABS 0.0  --  0.0  --  0.0 0.0 0.0  --   --    < > = values in this interval not displayed.    Recent Labs  Lab 12/24/19 1737 12/24/19 1737 12/24/19 1739 12/25/19 0047 12/25/19 0047 12/25/19 0841 12/26/19 0339 12/26/19 0339 12/26/19 0348 12/27/19 0423 12/28/19 0300 12/29/19 0212 12/29/19 0636 12/30/19 0235 12/30/19 0809 12/31/19 0443  NA 138   < >  --  139   < >  --  142   < >  --  142 145 147*  --  148*  --  148*  K 3.2*   < >  --  4.6   < >  --  3.8   < >  --  3.6 3.5 3.1*  --  3.4*  --  3.6  CL 107   < >  --  101   < >  --  104   < >  --  102 103 97*  --  99  --  99  CO2 23   < >  --  29   < >  --  28   < >  --  25 30 40*  --  42*  --  41*  GLUCOSE 115*   < >  --  146*   < >  --  175*   < >  --  204* 192* 203*  --  207*  --  181*  BUN 20   < >  --  26*   < >  --  37*   < >  --  41* 42* 34*  --  33*  --  35*  CREATININE 0.78   < >  --  0.95   < >  --  0.90   < >  --  1.27* 0.92 0.93  --  0.75  --  0.64  CALCIUM 7.0*   < >  --  8.1*   < >  --  7.9*   < >  --  8.0* 8.3* 8.0*  --  8.2*  --  8.5*  AST 214*   < >  --  248*   < >  --  143*   < >  --  104* 76* 60*  --  49*  --  41  ALT 100*   < >  --  137*   < >  --  115*   < >  --  110* 97* 82*  --  68*  --  62*  ALKPHOS 166*   < >  --  186*   < >  --  150*   < >  --  145* 139* 130*  --  116  --  116  BILITOT 0.8   < >  --  1.0   < >  --  0.4   < >  --  0.7 0.7 0.8  --  0.6  --  0.7  ALBUMIN 2.0*   < >  --  2.5*   < >  --  2.1*   < >  --  2.5* 2.4* 2.3*  --  2.0*  --  2.1*  MG  --   --   --   --   --  2.1  --   --   --   --   --   --  2.0  --   --   --   CRP 6.1*   < >  --  8.1*  --   --  6.0*  --   --  4.2* 3.4* 2.0*  --   --   --   --   DDIMER 1.18*   < >  --  1.25*  --   --  1.05*  --   --  1.15* 1.37* 1.77*  --   --   --   --   PROCALCITON 0.15  --   --   --   --   --   --   --   --   --   --   --   --   --   --   --   LATICACIDVEN  --   --  1.0  --   --   --   --   --   --   --   --   --   --   --   --   --   HGBA1C  --   --   --   --   --   --   --   --   --   --   --   --   --   --  6.7*  --   BNP  --   --   --  843.9*  --   --   --   --  1,160.1* 1,008.4* 1,579.5* 2,256.7*  --   --   --   --    < > = values in this interval not displayed.    Recent Labs  Lab 12/24/19 1737 12/24/19 1737 12/24/19 1839 12/25/19 0047 12/26/19 0339 12/26/19 0348 12/27/19 0423 12/28/19 0300 12/29/19 0212  CRP 6.1*   < >  --  8.1* 6.0*  --  4.2* 3.4* 2.0*  DDIMER 1.18*   < >  --  1.25* 1.05*  --  1.15* 1.37* 1.77*  BNP  --   --   --  843.9*  --  1,160.1* 1,008.4* 1,579.5* 2,256.7*  PROCALCITON 0.15  --   --   --   --   --   --   --    --   SARSCOV2NAA  --   --  POSITIVE*  --   --   --   --   --   --    < > = values in this interval not displayed.    ------------------------------------------------------------------------------------------------------------------ No results for input(s): CHOL, HDL, LDLCALC, TRIG, CHOLHDL, LDLDIRECT in the last 72 hours.  Lab Results  Component Value Date   HGBA1C 6.7 (H) 12/30/2019   ------------------------------------------------------------------------------------------------------------------ No results for input(s): TSH, T4TOTAL, T3FREE, THYROIDAB in the last 72 hours.  Invalid input(s): FREET3 ------------------------------------------------------------------------------------------------------------------ No results for input(s): VITAMINB12, FOLATE, FERRITIN, TIBC, IRON, RETICCTPCT in the last 72 hours.  Coagulation profile No results for input(s): INR, PROTIME in the last 168 hours.  Recent Labs    12/29/19 0212  DDIMER 1.77*    Cardiac Enzymes No results for input(s): CKMB, TROPONINI, MYOGLOBIN in the last 168 hours.  Invalid input(s): CK ------------------------------------------------------------------------------------------------------------------    Component Value Date/Time   BNP 2,256.7 (H) 12/29/2019 2952    Micro Results Recent Results (from the past 240 hour(s))  Blood Culture (routine x 2)     Status: None   Collection Time: 12/24/19  5:37 PM   Specimen: BLOOD LEFT ARM  Result Value Ref Range Status   Specimen Description BLOOD LEFT ARM  Final   Special Requests   Final    BOTTLES DRAWN AEROBIC AND ANAEROBIC Blood Culture adequate volume   Culture   Final    NO GROWTH 5 DAYS Performed at [facility], [address] [place], [place] [address]    Report Status 12/29/2019 FINAL  Final  Blood Culture (routine x 2)     Status: None   Collection Time: 12/24/19  5:51 PM   Specimen: BLOOD RIGHT HAND  Result Value Ref Range Status   Specimen  Description BLOOD RIGHT HAND  Final   Special Requests   Final    BOTTLES DRAWN AEROBIC AND ANAEROBIC Blood Culture adequate volume   Culture   Final    NO GROWTH 5 DAYS Performed at [facility], [place] [address] [place], [place] [address]    Report Status 12/29/2019 FINAL  Final  Respiratory Panel by RT PCR (Flu A&B, Covid) - Nasopharyngeal Swab     Status: Abnormal   Collection Time: 12/24/19  6:39 PM   Specimen: Nasopharyngeal Swab  Result Value Ref Range Status   SARS Coronavirus 2 by RT PCR POSITIVE (A) NEGATIVE Final    Comment: RESULT CALLED TO, READ BACK BY AND VERIFIED WITH: Trudi Ida RN 2030 12/24/19 A  BROWNING (NOTE) SARS-CoV-2 target nucleic acids are DETECTED.  SARS-CoV-2 RNA is generally detectable in upper respiratory specimens  during the acute phase of infection. Positive results are indicative of the presence of the identified virus, but do not rule out bacterial infection or co-infection with other pathogens not detected by the test. Clinical correlation with patient history and other diagnostic information is necessary to determine patient infection status. The expected result is Negative.  Fact Sheet for Patients:  PinkCheek.be  Fact Sheet for Healthcare Providers: GravelBags.it  This test is not yet approved or cleared by the [place] FDA and  has been authorized for detection and/or diagnosis of SARS-CoV-2 by FDA under an Emergency Use Authorization (EUA).  This EUA will remain in effect (meaning this test can be  used) for the duration of  the COVID-19 declaration under Section 564(b)(1) of the Act, 21 U.S.C. section 360bbb-3(b)(1), unless the authorization is terminated or revoked sooner.      Influenza A by PCR NEGATIVE NEGATIVE Final   Influenza B by PCR NEGATIVE NEGATIVE Final    Comment: (NOTE) The Xpert Xpress SARS-CoV-2/FLU/RSV assay is intended as an aid in  the diagnosis of  influenza from Nasopharyngeal swab specimens and  should not be used as a sole basis for treatment. Nasal washings and  aspirates are unacceptable for Xpert Xpress SARS-CoV-2/FLU/RSV  testing.  Fact Sheet for Patients: PinkCheek.be  Fact Sheet for Healthcare Providers: GravelBags.it  This test is not yet approved or cleared by the [place] FDA and  has been authorized for detection and/or diagnosis of SARS-CoV-2 by  FDA under an Emergency Use Authorization (EUA). This EUA will remain  in effect (meaning this test can be used) for the duration of the  Covid-19 declaration under Section 564(b)(1) of the Act, 21  U.S.C. section 360bbb-3(b)(1), unless the authorization is  terminated or revoked. Performed at [facility], [place] [address], [place], [place] [address]   MRSA PCR Screening     Status: Abnormal   Collection Time: 12/25/19 11:38 AM   Specimen: Nasal Mucosa; Nasopharyngeal  Result Value Ref Range Status   MRSA by PCR POSITIVE (A) NEGATIVE Final    Comment:        The GeneXpert MRSA Assay (FDA approved for NASAL specimens only), is one component of a comprehensive MRSA colonization surveillance program. It is not intended to diagnose MRSA infection nor to guide or monitor treatment for MRSA infections. RESULT CALLED TO, READ BACK BY AND VERIFIED WITH: Genella Rife RN 15:30 12/25/19 ([email or web address]) Performed at [facility], [place] [address], [place], [place] [address]   Surgical PCR screen     Status: Abnormal   Collection Time: 12/26/19 11:46 PM   Specimen: Nasal Mucosa; Nasal Swab  Result Value Ref Range Status   MRSA, PCR POSITIVE (A) NEGATIVE Final    Comment: RESULT CALLED TO, READ BACK BY AND VERIFIED WITH: M [facility] RN 12/27/19 0531 JDW    Staphylococcus aureus POSITIVE (A) NEGATIVE Final    Comment: (NOTE) The Xpert SA Assay (FDA approved for NASAL specimens in patients 22 years of age and older),  is one component of a comprehensive surveillance program. It is not intended to diagnose infection nor to guide or monitor treatment. Performed at [facility], [place] [address], [place],  [address]     Radiology Reports CT CHEST WO CONTRAST  Result Date: 12/26/2019 CLINICAL DATA:  COVID-19 positive patient with cough and congestion. EXAM: CT CHEST WITHOUT  CONTRAST TECHNIQUE: Multidetector CT imaging of the chest was performed following the standard protocol without IV contrast. COMPARISON:  Single-view of the chest 12/24/2019. PA and lateral chest 11/18/2019. FINDINGS: Cardiovascular: Marked cardiomegaly. Calcific aortic and coronary atherosclerosis. No aneurysm. No pericardial effusion. Mediastinum/Nodes: No enlarged mediastinal or axillary lymph nodes. Thyroid gland, trachea, and esophagus demonstrate no significant findings. Lungs/Pleura: The patient has small to moderate pleural effusions, larger on the right. There is associated compressive basilar atelectasis. Right worse than left airspace disease is seen in the upper lobes bilaterally. Small focus of airspace opacity is also seen in the right middle lobe. Upper Abdomen: Trace amount of perihepatic ascites noted. Musculoskeletal: No acute or focal abnormality.  Scoliosis noted. IMPRESSION: Bilateral upper lobe and right middle lobe airspace disease has an appearance most consistent with pneumonia. Small to moderate pleural effusions, larger on the right. Cardiomegaly. Trace amount of perihepatic ascites. Aortic Atherosclerosis (ICD10-I70.0). Calcific coronary artery disease also noted Electronically Signed   By: Inge Rise M.D.   On: 12/26/2019 14:51   DG Chest Port 1 View  Result Date: 12/30/2019 CLINICAL DATA:  86 year old female with positive COVID-19 and pleural effusion. EXAM: PORTABLE CHEST 1 VIEW COMPARISON:  Chest radiograph dated 12/24/2019 and CT dated 12/26/2019. FINDINGS: Bilateral streaky and hazy airspace  opacities most consistent with multifocal pneumonia and in keeping with COVID-19. No significant interval change compared to the radiograph of 12/24/2019. There are small bilateral pleural effusions. No pneumothorax. Stable cardiomegaly. Atherosclerotic calcification of the aorta. Osteopenia with degenerative changes of the spine. No acute osseous pathology. IMPRESSION: 1. Multifocal pneumonia in keeping with COVID-19. 2. Small bilateral pleural effusions. Electronically Signed   By: Anner Crete M.D.   On: 12/30/2019 16:31   DG Chest Port 1 View  Result Date: 12/24/2019 CLINICAL DATA:  COVID-19 positivity with cough and chest congestion EXAM: PORTABLE CHEST 1 VIEW COMPARISON:  11/18/2018 FINDINGS: Cardiac shadow is enlarged but stable. Aortic calcifications are again seen. Patchy airspace opacity is noted in the right apex new from the prior exam consistent with the given clinical history. Patchy bibasilar opacities are again seen. No acute bony abnormality is noted. IMPRESSION: New right upper lobe airspace opacity consistent with the given clinical history. Electronically Signed   By: Inez Catalina M.D.   On: 12/24/2019 18:31   US Abdomen Limited RUQ  Result Date: 12/26/2019 CLINICAL DATA:  Transaminitis EXAM: ULTRASOUND ABDOMEN LIMITED RIGHT UPPER QUADRANT COMPARISON:  04/25/2019 FINDINGS: Gallbladder: There is pericholecystic free fluid with mild gallbladder wall thickening with the gallbladder wall measuring approximately 3 mm. There are no gallstones. The sonographic Percell Miller sign is reported as negative. Common bile duct: Diameter: 3 mm Liver: The liver parenchyma is coarsened and heterogeneous. Liver surface appears somewhat nodular. Portal vein is patent on color Doppler imaging with normal direction of blood flow towards the liver. Other: There is a large right-sided pleural effusion. There is a small volume of ascites in the upper abdomen. IMPRESSION: 1. Gallbladder wall thickening without  cholelithiasis or definite sonographic evidence for acute cholecystitis. Gallbladder wall thickening is a nonspecific finding and can be seen in patients with ascites or heart failure. 2. Incidentally noted large right-sided pleural effusion. 3. Small volume ascites. 4. Again noted is a coarsened and heterogeneous appearance of the liver parenchyma consistent with underlying hepatocellular disease. Electronically Signed   By: Constance Holster M.D.   On: 12/26/2019 01:45    Phillips Climes M.D on 12/31/2019 at 3:42 PM  To page go to [email or web address]

## 2019-12-31 NOTE — TOC Progression Note (Addendum)
Transition of Care [facility]) - Progression Note    Patient Details  Name: Shirley Bates MRN: [id number] Date of Birth: 19-Feb-1926  Transition of Care [facility]) CM/SW [place], RN Phone Number: [phone]  12/31/2019, 10:35 AM  Clinical Narrative: CM received call from patients daughter [name] stating that family has spoken with Jimmie Molly at [facility] and the facility is able to provide 24 hour care including PT services. Daughter states that the family wishes for the patient to return back to [facility]. CM called Valerie Salts who is the Scientist, physiological of [facility]. Pamala Hurry has confirmed that the facility is willing to take the patient back and that they will continue to provide 24 hour care and therapies as the patient allows. MD has been updated.    Expected Discharge Plan: [place] Barriers to Discharge: Continued Medical Work up  Expected Discharge Plan and Services Expected Discharge Plan: [place] In-house Referral: NA Discharge Planning Services: CM Consult Post Acute Care Choice: [place] Living arrangements for the past 2 months: [place]                 DME Arranged: N/A DME Agency: NA       HH Arranged: NA HH Agency: NA         Social Determinants of Health (SDOH) Interventions    Readmission Risk Interventions Readmission Risk Prevention Plan 12/26/2019  Transportation Screening Complete  PCP or Specialist Appt within 3-5 Days Complete  HRI or [place] Complete  Social Work Consult for [place] Planning/Counseling Complete  Palliative Care Screening Not Applicable  Medication Review Press photographer) Referral to Pharmacy  Some recent data might be hidden

## 2019-12-31 NOTE — [place] FL2 (Signed)
[place] MEDICAID FL2 LEVEL OF CARE SCREENING TOOL     IDENTIFICATION  Patient Name: Shirley Bates Birthdate: 1925/11/30 Sex: female Admission Date (Current Location): 12/24/2019  [facility] and [place] Number:  Engineering geologist and Address:  [facility], [place] [address] [place], [place] [address]      Provider Number: [id number]  Attending Physician Name and Address:  Albertine Patricia, MD  Relative Name and Phone Number:  [name] [phone]    Current Level of Care: Hospital Recommended Level of Care: Memory Care (Memory Care is able to provide 24hr supervision and PT) Prior Approval Number:    Date Approved/Denied:   PASRR Number:    Discharge Plan: Other (Comment) (Memory Care)    Current Diagnoses: Patient Active Problem List   Diagnosis Date Noted  . Pleural effusion   . Pressure injury of skin 12/25/2019  . Acute respiratory failure ([place]) 12/24/2019  . Acute respiratory disease due to COVID-19 virus 12/24/2019  . Acute systolic heart failure ([place]) 11/18/2019  . Persistent atrial fibrillation ([place])   . Atrial fibrillation, new onset ([place]) 11/08/2019  . Impaired mobility and activities of daily living 10/14/2019  . Atrial fibrillation with RVR ([place]) 10/12/2019  . Fluid overload 10/12/2019  . Anemia 10/12/2019  . Dilated cardiomyopathy ([place]) 10/12/2019  . Osteoporosis 05/28/2015  . Left ear impacted cerumen 12/12/2012  . OA (osteoarthritis) of neck 12/12/2012  . Hyperlipidemia   . Essential hypertension     Orientation RESPIRATION BLADDER Height & Weight     Self  O2 (2L) Incontinent, External catheter Weight: 52.1 kg Height:     BEHAVIORAL SYMPTOMS/MOOD NEUROLOGICAL BOWEL NUTRITION STATUS     (n/a no history noted) Continent Diet (soft diet)  AMBULATORY STATUS COMMUNICATION OF NEEDS Skin   Supervision Verbally PU Stage and Appropriate Care (stage 2 moisture barrier)   PU Stage 2 Dressing: No Dressing (moisture  barrier as needed)                   Personal Care Assistance Level of Assistance  Bathing, Feeding, Dressing Bathing Assistance: Limited assistance Feeding assistance: Independent (After set up) Dressing Assistance: Limited assistance     Functional Limitations Info  Sight, Hearing, Speech Sight Info: Adequate Hearing Info: Adequate Speech Info: Adequate    SPECIAL CARE FACTORS FREQUENCY  PT (By licensed PT), OT (By licensed OT)     PT Frequency: 3X OT Frequency: 3X            Contractures Contractures Info: Not present    Additional Factors Info  Code Status, Allergies, Psychotropic, Insulin Sliding Scale, Isolation Precautions, Suctioning Needs Code Status Info: DNR Allergies Info: NKDA Psychotropic Info: Hx of advanced dementia Insulin Sliding Scale Info: n/a see d/c summary for medication lisrt Isolation Precautions Info: Contact/ airborne/ MRSA    COVID positive Suctioning Needs: n/a   Current Medications (12/31/2019):  This is the current hospital active medication list Current Facility-Administered Medications  Medication Dose Route Frequency Provider Last Rate Last Admin  . (feeding supplement) PROSource Plus liquid 30 mL  30 mL Oral BID BM Elgergawy, Silver Huguenin, MD   30 mL at 12/31/19 0928  . acetaminophen (TYLENOL) tablet 650 mg  650 mg Oral Q6H PRN Rise Patience, MD      . amiodarone (PACERONE) tablet 200 mg  200 mg Oral Daily Rise Patience, MD   200 mg at 12/31/19 0929  . apixaban (ELIQUIS) tablet 2.5 mg  2.5  mg Oral BID Wilson Singer I, RPH   2.5 mg at 12/31/19 2585  . chlorpheniramine-HYDROcodone (TUSSIONEX) 10-8 MG/5ML suspension 5 mL  5 mL Oral Q12H PRN Rise Patience, MD   5 mL at 12/27/19 0327  . dextrose 5 % solution   Intravenous Continuous Elgergawy, Silver Huguenin, MD 40 mL/hr at 12/31/19 0930 New Bag at 12/31/19 0930  . feeding supplement (ENSURE ENLIVE) (ENSURE ENLIVE) liquid 237 mL  237 mL Oral BID BM Elgergawy, Silver Huguenin, MD    237 mL at 12/30/19 1401  . ferrous sulfate tablet 325 mg  325 mg Oral Q breakfast Rise Patience, MD   325 mg at 12/31/19 2778  . furosemide (LASIX) tablet 20 mg  20 mg Oral Q M,W,F Rise Patience, MD   20 mg at 12/31/19 0929  . guaiFENesin-dextromethorphan (ROBITUSSIN DM) 100-10 MG/5ML syrup 10 mL  10 mL Oral Q4H PRN Rise Patience, MD   10 mL at 12/28/19 0251  . insulin aspart (novoLOG) injection 0-9 Units  0-9 Units Subcutaneous TID WC Elgergawy, Silver Huguenin, MD   2 Units at 12/31/19 0930  . methylPREDNISolone sodium succinate (SOLU-MEDROL) 40 mg/mL injection 40 mg  40 mg Intravenous Daily Elgergawy, Silver Huguenin, MD   40 mg at 12/31/19 0929  . metoprolol succinate (TOPROL-XL) 24 hr tablet 100 mg  100 mg Oral Daily Rise Patience, MD   100 mg at 12/31/19 2423  . multivitamin with minerals tablet 1 tablet  1 tablet Oral Daily Elgergawy, Silver Huguenin, MD   1 tablet at 12/31/19 0929  . ondansetron (ZOFRAN) injection 4 mg  4 mg Intravenous Q6H PRN Rise Patience, MD      . pantoprazole (PROTONIX) EC tablet 40 mg  40 mg Oral Daily Thurnell Lose, MD   40 mg at 12/31/19 0928  . QUEtiapine (SEROQUEL) tablet 12.5 mg  12.5 mg Oral QHS Elgergawy, Silver Huguenin, MD      . simvastatin (ZOCOR) tablet 20 mg  20 mg Oral QHS Rise Patience, MD   20 mg at 12/30/19 2129     Discharge Medications: Please see discharge summary for a list of discharge medications.  Relevant Imaging Results:  Relevant Lab Results:   Additional Information SSN [id number]   COVID positive  Angelita Ingles, RN

## 2019-12-31 NOTE — Progress Notes (Signed)
Occupational Therapy Treatment Patient Details Name: TICIA VIRGO MRN: [id number] DOB: [birth year] Today's Date: 12/31/2019    History of present illness KAMIRYN BEZANSON is a 86 y.o. female with history of cardiomyopathy, A. fib, hypertension, severe dementia was found to be Covid positive.  LIves in [place] where she was ambulatory adn assisted with her ADL tasks. Admitted due to fever and hypoxia requiring 4L O2 and CDR showing infiltrates.   OT comments  Pt received in bed, lethargic but easily encouraged to participate in [place] activities. Pt kept eyes closed for majority of session, requiring increased time to follow commands. Pt unable to attempt dressing tasks today despite multimodal cueing - Max A for donning socks. Pt overall Max A for bed mobility, Mod A for transfer to chair. Pt with difficulty achieving upright posture with RW, so used manual therapist assist instead for transfer. Unable to safely attempt further mobility today. Pt on 3 L O2, 92% at rest and 88% after transfer. Pt appears with physical decline since OT evaluation, updated recommendations to SNF. Will continue to monitor and update recommendations as appropriate.     Follow Up Recommendations  SNF;Supervision/Assistance - 24 hour    Equipment Recommendations  None recommended by OT    Recommendations for Other Services      Precautions / Restrictions Precautions Precautions: Fall;Other (comment) Precaution Comments: watch HR, O2 Restrictions Weight Bearing Restrictions: No       Mobility Bed Mobility Overal bed mobility: Needs Assistance Bed Mobility: Supine to Sit     Supine to sit: Max assist;HOB elevated     General bed mobility comments: Max A to sit EOB, able to assist in pulling self to EOB via therapist's hand, but assistance to advance LE and scoot EOB safely  Transfers Overall transfer level: Needs assistance Equipment used: Rolling walker (2 wheeled);None Transfers: Sit to/from [employer] Sit to Stand: Mod assist Stand pivot transfers: Mod assist       General transfer comment: Mod A for power up, posterior lean and forward hip flexion. trialed RW with pt unable to stand successfully. Ultimately, used manual assist from therapist with pt holding behind therapist elbow to turn to recliner. Unable to safely mobilize further today    Balance Overall balance assessment: Needs assistance Sitting-balance support: Bilateral upper extremity supported;Feet supported Sitting balance-Leahy Scale: Fair Sitting balance - Comments: close min guard, unsteadiness sitting EOB Postural control: Posterior lean Standing balance support: Bilateral upper extremity supported;During functional activity Standing balance-Leahy Scale: Poor Standing balance comment: reliant on external support                           ADL either performed or assessed with clinical judgement   ADL Overall ADL's : Needs assistance/impaired     Grooming: Supervision/safety;Bed level;Wash/dry face Grooming Details (indicate cue type and reason): Cues to initiate task, once handed washcloth, able to wash face             Lower Body Dressing: Maximal assistance;Sit to/from stand Lower Body Dressing Details (indicate cue type and reason): Max A to don socks, attempted multimodal cueing with diffiuclty initiating task. Pt attempting to stand prior to donning socks Toilet Transfer: Moderate assistance;Stand-pivot Toilet Transfer Details (indicate cue type and reason): Simulated to recliner           General ADL Comments: Pt with appearance of worsened debility for functional tasks today, difficulty maintaining balance/posture in standing  Vision   Vision Assessment?: Vision impaired- to be further tested in functional context Additional Comments: kept eyes closed during entire session. one attempt to open eyes when cued   Perception     Praxis      Cognition  Arousal/Alertness: Lethargic Behavior During Therapy: Flat affect Overall Cognitive Status: No family/caregiver present to determine baseline cognitive functioning                                 General Comments: Pt resides in memory care ALF, hx of dementia. Kept eyes closed for majority of session, intemittently saying "help me". Follows one step commands with increased time        Exercises     Shoulder Instructions       General Comments Pt received on 3 L O2, 92% at rest, 88% after transfer    Pertinent Vitals/ Pain       Pain Assessment: Faces Faces Pain Scale: No hurt  Home Living                                          Prior Functioning/Environment              Frequency  Min 2X/week        Progress Toward Goals  OT Goals(current goals can now be found in the care plan section)  Progress towards OT goals: Progressing toward goals  Acute Rehab OT Goals Patient Stated Goal: not stated OT Goal Formulation: Patient unable to participate in goal setting Time For Goal Achievement: 12/31/2019 Potential to Achieve Goals: Good ADL Goals Pt Will Perform Grooming: with supervision;standing Pt Will Perform Lower Body Bathing: with supervision;with set-up;sit to/from stand Pt Will Perform Lower Body Dressing: with supervision;sit to/from stand Pt Will Transfer to Toilet: with supervision;ambulating  Plan Discharge plan needs to be updated    Co-evaluation                 AM-PAC OT "6 Clicks" Daily Activity     Outcome Measure   Help from another person eating meals?: A Little Help from another person taking care of personal grooming?: A Little Help from another person toileting, which includes using toliet, bedpan, or urinal?: A Lot Help from another person bathing (including washing, rinsing, drying)?: A Little Help from another person to put on and taking off regular upper body clothing?: A Little Help from another  person to put on and taking off regular lower body clothing?: A Lot 6 Click Score: 16    End of Session Equipment Utilized During Treatment: Gait belt;Rolling walker;Oxygen  OT Visit Diagnosis: Unsteadiness on feet (R26.81);Muscle weakness (generalized) (M62.81);Pain   Activity Tolerance Patient limited by lethargy   Patient Left in chair;with call bell/phone within reach;with chair alarm set   Nurse Communication Mobility status        Time: 9826-4158 OT Time Calculation (min): 26 min  Charges: OT General Charges $OT Visit: 1 Visit OT Treatments $Self Care/Home Management : 8-22 mins $Therapeutic Activity: 8-22 mins  Layla Maw, OTR/L   Layla Maw 12/31/2019, 9:03 AM

## 2020-01-01 LAB — COMPREHENSIVE METABOLIC PANEL
ALT: 54 U/L — ABNORMAL HIGH (ref 0–44)
AST: 31 U/L (ref 15–41)
Albumin: 2 g/dL — ABNORMAL LOW (ref 3.5–5.0)
Alkaline Phosphatase: 106 U/L (ref 38–126)
Anion gap: 10 (ref 5–15)
BUN: 35 mg/dL — ABNORMAL HIGH (ref 8–23)
CO2: 39 mmol/L — ABNORMAL HIGH (ref 22–32)
Calcium: 8.8 mg/dL — ABNORMAL LOW (ref 8.9–10.3)
Chloride: 97 mmol/L — ABNORMAL LOW (ref 98–111)
Creatinine, Ser: 0.69 mg/dL (ref 0.44–1.00)
GFR, Estimated: >60 mL/min (ref >60)
Glucose, Bld: 174 mg/dL — ABNORMAL HIGH (ref 70–99)
Potassium: 3.2 mmol/L — ABNORMAL LOW (ref 3.5–5.1)
Sodium: 146 mmol/L — ABNORMAL HIGH (ref 135–145)
Total Bilirubin: 0.9 mg/dL (ref 0.3–1.2)
Total Protein: 5.5 g/dL — ABNORMAL LOW (ref 6.5–8.1)

## 2020-01-01 LAB — CBC
HCT: 42 % (ref 36.0–46.0)
Hemoglobin: 13.4 g/dL (ref 12.0–15.0)
MCH: 29.2 pg (ref 26.0–34.0)
MCHC: 31.9 g/dL (ref 30.0–36.0)
MCV: 91.5 fL (ref 80.0–100.0)
Platelets: 161 10*3/uL (ref 150–400)
RBC: 4.59 MIL/uL (ref 3.87–5.11)
RDW: 18.4 % — ABNORMAL HIGH (ref 11.5–15.5)
WBC: 12.5 10*3/uL — ABNORMAL HIGH (ref 4.0–10.5)
nRBC: 0 % (ref 0.0–0.2)

## 2020-01-01 LAB — GLUCOSE, CAPILLARY
Glucose-Capillary: 139 mg/dL — ABNORMAL HIGH (ref 70–99)
Glucose-Capillary: 141 mg/dL — ABNORMAL HIGH (ref 70–99)
Glucose-Capillary: 108 mg/dL — ABNORMAL HIGH (ref 70–99)
Glucose-Capillary: 158 mg/dL — ABNORMAL HIGH (ref 70–99)

## 2020-01-01 MED ORDER — PROSOURCE PLUS PO LIQD: 30.0000 mL | Freq: Two times a day (BID) | ORAL | 0 refills | Status: AC

## 2020-01-01 MED ORDER — POTASSIUM CHLORIDE CRYS ER 20 MEQ PO TBCR: 40.0000 meq | EXTENDED_RELEASE_TABLET | Freq: Once | ORAL | Status: DC

## 2020-01-01 MED ORDER — FUROSEMIDE 20 MG PO TABS: ORAL_TABLET | ORAL | 3 refills | Status: AC

## 2020-01-01 MED ORDER — POTASSIUM CHLORIDE CRYS ER 20 MEQ PO TBCR
40.0000 meq | EXTENDED_RELEASE_TABLET | ORAL | Status: AC
  Administered 2020-01-01 (×2): 40 meq via ORAL
  Filled 2020-01-01 (×2): qty 2

## 2020-01-01 MED ORDER — PANTOPRAZOLE SODIUM 40 MG PO TBEC: 40.0000 mg | DELAYED_RELEASE_TABLET | Freq: Every day | ORAL | 0 refills | Status: AC

## 2020-01-01 MED ORDER — ENSURE ENLIVE PO LIQD: 237.0000 mL | Freq: Two times a day (BID) | ORAL | 12 refills | Status: AC

## 2020-01-01 NOTE — Discharge Summary (Signed)
Shirley Bates, is a 86 y.o. female  DOB 1926/03/03  MRN [id number].  Admission date:  12/24/2019  Admitting Physician  Rise Patience, MD  Discharge Date:  01/01/2020   Primary MD  Jeanette Caprice, PA-C  Recommendations for primary care physician for things to follow:  -Recheck CBC, CMP in 3 days. -Adjust Lasix dose as needed, volume status is very tenuous given her age and frailty, and CHF, so her Lasix dose will likely need to be adjusted frequently according to her intake, and volume status. -Monitor closely during her meals , and encouraged her oral intake  CODE STATUS: DNR  Admission Diagnosis  Acute respiratory failure ([place]) [J96.00] Acute respiratory failure with hypoxia ([place]) [J96.01] Atrial fibrillation with RVR (H[facility]) [I48.91] Acute respiratory disease due to COVID-19 virus [U07.1, J06.9] COVID-19 [U07.1]   Discharge Diagnosis  Acute respiratory failure ([place]) [J96.00] Acute respiratory failure with hypoxia (HCC) [J96.01] Atrial fibrillation with RVR (HCC) [I48.91] Acute respiratory disease due to COVID-19 virus [U07.1, J06.9] COVID-19 [U07.1]    Principal Problem:   Acute respiratory failure (HCC) Active Problems:   Essential hypertension   Anemia   Dilated cardiomyopathy (HCC)   Persistent atrial fibrillation (HCC)   Acute respiratory disease due to COVID-19 virus   Pressure injury of skin   Pleural effusion      Past Medical History:  Diagnosis Date  . Atrial fibrillation status post cardioversion ([place]) 10/12/2019   With recurrent A. fib post cardioversion (was placed on amiodarone and had TEE cardioversion)  . Cancer ([place])   . Hyperlipidemia   . Hypertension     Past Surgical History:  Procedure Laterality Date  . 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 CARDIOVERSION N/A 10/16/2019   Procedure: CARDIOVERSION;  Surgeon: Skeet Latch, MD;  Location: [facility];  Service: Cardiovascular;;;  Post procedure patient had little bursts of A. fib.  Was given 150 amiodarone bolus  . CARDIOVERSION N/A 11/16/2019   Procedure: CARDIOVERSION;  Surgeon: Acie Fredrickson Wonda Cheng, MD;  Location: [facility];  Service: Cardiovascular;  Laterality: N/A;  . TEE WITHOUT CARDIOVERSION N/A 10/16/2019   Procedure: TRANSESOPHAGEAL ECHOCARDIOGRAM (TEE);  Surgeon: Skeet Latch, MD;  Location: [facility];  Service: Cardiovascular;;  EF estimated 30 to 35% moderate decreased function.  Moderately reduced RV function.  No LAA thrombus.  Mild MR with multiple jets.  Mild to moderate TR.  Grade 2 aortic atheroma.   . TRANSTHORACIC ECHOCARDIOGRAM  10/12/2019   EF 40 and 45%.  Moderate biatrial enlargement.  Mild to moderate AI.       History of present illness and  Hospital Course:     Kindly see H&P for history of present illness and admission details, please review complete Labs, Consult reports and Test reports for all details in brief  HPI  from the history and physical done on the day of admission 12/24/2019   HPI: Shirley Bates is a 86 y.o. female with history of cardiomyopathy, A. fib, hypertension, severe dementia was found  to be Covid positive yesterday.  As per the patient's daughter one of the residents at the living facility was tested positive last week and subsequently all the residents were being checked alternatingly every other day for Covid test.  Patient turned out to be Covid positive yesterday but was asymptomatic.  But since this morning patient was getting more febrile and was brought to the ER.  ED Course: In the ER patient was hypoxic requiring 4 L oxygen with chest x-ray showing infiltrates Covid test was positive.  Labs are significant for elevated LFTs with AST of 214 ALT of 100 total bili 1.8 potassium 3.2 CRP 6.1 procalcitonin 0.15 hemoglobin 10.9.  D-dimer was 1.18.  Initially patient was in A. fib with RVR improved without any intervention.   Patient is started on steroids and remdesivir after discussing with patient's daughter and admitted for acute respiratory failure secondary to Covid infection.  Hospital Course   Acute on chronic hypoxic respiratory failure due to volume overload/COVID-19 pneumonia/possible bacterial pneumonia. -Patient on 2 L oxygen at baseline, she required up to 4 L, and this is due to above, she is back to baseline 2 L nasal cannula . -COVID-19 pneumonia was treated with IV remdesivir, and IV steroids, patient is vaccinated, so she did have mild disease, her worsening respiratory failure was most likely due to volume overload from CHF, and possible bacterial pneumonia, no further need for steroids on discharge. -Treated with IV Unasyn during hospital stay given borderline procalcitonin. -Please see discussion below under CHF and pleural effusion. -Patient respiratory status is very tenuous, having her old age and frailty, she will need continuous encouragement to sit out of bed to chair, encouraged to take a deep breath, and she need to be watched closely during her meals.  Markedly elevated LFTs /liver cirrhosis -Secondary to COVID-19 pneumonia, with known underlining chronic liver disease/cirrhosis at baseline, as discussed with daughter, they are aware of this diagnosis, decision was made for no further work-up at this point given her advanced age. -LFTs are trending down  A. fib with RVR -At some point she had uncontrolled heart rate, where she did require as needed digoxin, but heart rate currently controlled, continue with metoprolol and amiodarone  -She is on Eliquis for anticoagulation.  Right Pleural  effusion -Most likely due to CHF/and liver cirrhosis, have discussed with PCCM, given respiratory status back to baseline, no increased oxygen requirement, and her significant facility, and anticoagulation, recommendation hold on thoracentesis at this point as it risks outweighed benefits.   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  -Blood pressure and heart rate very tenuous, so she was kept on her facility dose Lasix, and she has been dosed with IV Lasix daily depends on blood pressure and volume status, currently respiratory status has improved, back to baseline, so she will be discharged on increased loss Lasix of 20 mg oral daily, and 40 mg once a day, but this need to be monitored closely and adjusted as needed given her frailty and unreliable oral intake . -I have stopped her losartan given soft blood pressure and AKI, this can be resumed as outpatient if blood pressure and kidney function is stable.  Hypernatremia - due to diuresis, peaked at 148, today is 146, will allow to trend down gradually   Hypokalemia -Repleted  AKI -Creatinine is up to 1.27, have stopped losartan .  Severe dementia. No acute issues.   Hospital delirium -This appears  to be significantly improved after starting on Leah dose Seroquel which has been tapered down.  Discharge Condition:  Stable  Overall she is very tenuous and frail given her advanced age and comorbidities   Discharge Instructions  and  Discharge Medications    Discharge Instructions    Discharge instructions   Complete by: As directed    Follow with Primary MD Kurth-Bowen, Cornelia, PA-C in 3 days   Get CBC, CMP,   by Primary MD next visit.    Activity: As tolerated with Full fall precautions use walker/cane & assistance as needed   Disposition [facility]   Diet: Heart Healthy, with fluid restriction, patient will need mechanical soft diet, less she has dentures given she is edentulous, with feeding assistance and aspiration precautions.  For Heart failure patients - Check your Weight same time everyday, if you gain over 2 pounds, or you develop in leg swelling, experience more shortness of breath or chest pain, call your Primary MD immediately. Follow Cardiac Low Salt  Diet and 1.5 lit/day fluid restriction.   On your next visit with your primary care physician please Get Medicines reviewed and adjusted.   Please request your Prim.MD to go over all Hospital Tests and Procedure/Radiological results at the follow up, please get all Hospital records sent to your Prim MD by signing hospital release before you go home.   If you experience worsening of your admission symptoms, develop shortness of breath, life threatening emergency, suicidal or homicidal thoughts you must seek medical attention immediately by calling 911 or calling your MD immediately  if symptoms less severe.  You Must read complete instructions/literature along with all the possible adverse reactions/side effects for all the Medicines you take and that have been prescribed to you. Take any new Medicines after you have completely understood and accpet all the possible adverse reactions/side effects.   Do not drive, operating heavy machinery, perform activities at heights, swimming or participation in water activities or provide baby sitting services if your were admitted for syncope or siezures until you have seen by Primary MD or a Neurologist and advised to do so again.  Do not drive when taking Pain medications.    Do not take more than prescribed Pain, Sleep and Anxiety Medications  Special Instructions: If you have smoked or chewed Tobacco  in the last 2 yrs please stop smoking, stop any regular Alcohol  and or any Recreational drug use.  Wear Seat belts while driving.   Please note  You were cared for by a hospitalist during your hospital stay. If you have any questions about your discharge medications or the care you received while you were in the hospital after you are discharged, you can call the unit and asked to speak with the hospitalist on call if the hospitalist that took care of you is not available. Once you are discharged, your primary care physician will handle any further  medical issues. Please note that NO REFILLS for any discharge medications will be authorized once you are discharged, as it is imperative that you return to your primary care physician (or establish a relationship with a primary care physician if you do not have one) for your aftercare needs so that they can reassess your need for medications and monitor your lab values.   Increase activity slowly   Complete by: As directed    No wound care   Complete by: As directed      Allergies as of 01/01/2020  No Known Allergies     Medication List    STOP taking these medications   losartan 25 MG tablet Commonly known as: COZAAR     TAKE these medications   (feeding supplement) PROSource Plus liquid Take 30 mLs by mouth 2 (two) times daily between meals.   feeding supplement (ENSURE ENLIVE) Liqd Take 237 mLs by mouth 2 (two) times daily between meals.   acetaminophen 500 MG tablet Commonly known as: TYLENOL Take 500 mg by mouth every 6 (six) hours as needed for mild pain.   aluminum-magnesium hydroxide-simethicone 735-329-92 MG/5ML Susp Commonly known as: MAALOX Take 30 mLs by mouth every 6 (six) hours as needed (indigestion).   amiodarone 200 MG tablet Commonly known as: PACERONE Take 1 tablet (200 mg total) by mouth daily.   apixaban 2.5 MG Tabs tablet Commonly known as: ELIQUIS Take 1 tablet (2.5 mg total) by mouth 2 (two) times daily.   ferrous sulfate 325 (65 FE) MG tablet Take 1 tablet (325 mg total) by mouth daily with breakfast.   furosemide 20 MG tablet Commonly known as: Lasix Take 20 mg everyday except on Mon,Take 40 mg ( 2 tablets). If weight is greater than 3 lbs or increase swelling. Take 40 mg for 3 days then return daily dosing. What changed: additional instructions   guaiFENesin-dextromethorphan 100-10 MG/5ML syrup Commonly known as: ROBITUSSIN DM Take 5 mLs by mouth every 4 (four) hours as needed for cough.   loperamide 2 MG capsule Commonly known as:  IMODIUM Take 2 mg by mouth as needed for diarrhea or loose stools (total 8 doses in 24 HR).   magnesium hydroxide 400 MG/5ML suspension Commonly known as: MILK OF MAGNESIA Take 30 mLs by mouth at bedtime as needed for mild constipation.   metoprolol succinate 100 MG 24 hr tablet Commonly known as: TOPROL-XL Take 1 tablet (100 mg total) by mouth daily. Take with or immediately following a meal.   multivitamin-iron-minerals-folic acid chewable tablet Chew 1 tablet by mouth daily.   MULTIVITAMIN/FLUORIDE PO Take 1 tablet by mouth every morning. Chewable   naphazoline-pheniramine 0.025-0.3 % ophthalmic solution Commonly known as: NAPHCON-A 1 drop daily.   neomycin-bacitracin-polymyxin ointment Commonly known as: NEOSPORIN Apply 1 application topically as needed for wound care.   NONFORMULARY OR COMPOUNDED ITEM Apply 1 application topically at bedtime. Antifungal solution: Terbinafine 3%, Fluconazole 2%, Tea Tree Oil 5%, Urea 10%, Ibuprofen 2% in DMSO suspension #21mL  Apply at bedtime to all toenails except right 1st toe   pantoprazole 40 MG tablet Commonly known as: PROTONIX Take 1 tablet (40 mg total) by mouth daily. Start taking on: January 02, 2020   potassium chloride SA 20 MEQ tablet Commonly known as: KLOR-CON TAKE 1 TABLET(20 MEQ) BY MOUTH DAILY   simvastatin 20 MG tablet Commonly known as: ZOCOR TAKE 1 TABLET BY MOUTH AT BEDTIME         Diet and Activity recommendation: See Discharge Instructions above   Consults obtained -  PCCM   Major procedures and Radiology Reports - PLEASE review detailed and final reports for all details, in brief -      CT CHEST WO CONTRAST  Result Date: 12/26/2019 CLINICAL DATA:  COVID-19 positive patient with cough and congestion. EXAM: CT CHEST WITHOUT CONTRAST TECHNIQUE: Multidetector CT imaging of the chest was performed following the standard protocol without IV contrast. COMPARISON:  Single-view of the chest  12/24/2019. PA and lateral chest 11/18/2019. FINDINGS: Cardiovascular: Marked cardiomegaly. Calcific aortic and coronary atherosclerosis. No aneurysm. No  pericardial effusion. Mediastinum/Nodes: No enlarged mediastinal or axillary lymph nodes. Thyroid gland, trachea, and esophagus demonstrate no significant findings. Lungs/Pleura: The patient has small to moderate pleural effusions, larger on the right. There is associated compressive basilar atelectasis. Right worse than left airspace disease is seen in the upper lobes bilaterally. Small focus of airspace opacity is also seen in the right middle lobe. Upper Abdomen: Trace amount of perihepatic ascites noted. Musculoskeletal: No acute or focal abnormality.  Scoliosis noted. IMPRESSION: Bilateral upper lobe and right middle lobe airspace disease has an appearance most consistent with pneumonia. Small to moderate pleural effusions, larger on the right. Cardiomegaly. Trace amount of perihepatic ascites. Aortic Atherosclerosis (ICD10-I70.0). Calcific coronary artery disease also noted Electronically Signed   By: Inge Rise M.D.   On: 12/26/2019 14:51   DG Chest Port 1 View  Result Date: 12/30/2019 CLINICAL DATA:  86 year old female with positive COVID-19 and pleural effusion. EXAM: PORTABLE CHEST 1 VIEW COMPARISON:  Chest radiograph dated 12/24/2019 and CT dated 12/26/2019. FINDINGS: Bilateral streaky and hazy airspace opacities most consistent with multifocal pneumonia and in keeping with COVID-19. No significant interval change compared to the radiograph of 12/24/2019. There are small bilateral pleural effusions. No pneumothorax. Stable cardiomegaly. Atherosclerotic calcification of the aorta. Osteopenia with degenerative changes of the spine. No acute osseous pathology. IMPRESSION: 1. Multifocal pneumonia in keeping with COVID-19. 2. Small bilateral pleural effusions. Electronically Signed   By: Anner Crete M.D.   On: 12/30/2019 16:31   DG Chest  Port 1 View  Result Date: 12/24/2019 CLINICAL DATA:  COVID-19 positivity with cough and chest congestion EXAM: PORTABLE CHEST 1 VIEW COMPARISON:  11/18/2018 FINDINGS: Cardiac shadow is enlarged but stable. Aortic calcifications are again seen. Patchy airspace opacity is noted in the right apex new from the prior exam consistent with the given clinical history. Patchy bibasilar opacities are again seen. No acute bony abnormality is noted. IMPRESSION: New right upper lobe airspace opacity consistent with the given clinical history. Electronically Signed   By: Inez Catalina M.D.   On: 12/24/2019 18:31   US Abdomen Limited RUQ  Result Date: 12/26/2019 CLINICAL DATA:  Transaminitis EXAM: ULTRASOUND ABDOMEN LIMITED RIGHT UPPER QUADRANT COMPARISON:  04/25/2019 FINDINGS: Gallbladder: There is pericholecystic free fluid with mild gallbladder wall thickening with the gallbladder wall measuring approximately 3 mm. There are no gallstones. The sonographic Percell Miller sign is reported as negative. Common bile duct: Diameter: 3 mm Liver: The liver parenchyma is coarsened and heterogeneous. Liver surface appears somewhat nodular. Portal vein is patent on color Doppler imaging with normal direction of blood flow towards the liver. Other: There is a large right-sided pleural effusion. There is a small volume of ascites in the upper abdomen. IMPRESSION: 1. Gallbladder wall thickening without cholelithiasis or definite sonographic evidence for acute cholecystitis. Gallbladder wall thickening is a nonspecific finding and can be seen in patients with ascites or heart failure. 2. Incidentally noted large right-sided pleural effusion. 3. Small volume ascites. 4. Again noted is a coarsened and heterogeneous appearance of the liver parenchyma consistent with underlying hepatocellular disease. Electronically Signed   By: Constance Holster M.D.   On: 12/26/2019 01:45    Micro Results     Recent Results (from the past 240 hour(s))    Blood Culture (routine x 2)     Status: None   Collection Time: 12/24/19  5:37 PM   Specimen: BLOOD LEFT ARM  Result Value Ref Range Status   Specimen Description BLOOD LEFT ARM  Final  Special Requests   Final    BOTTLES DRAWN AEROBIC AND ANAEROBIC Blood Culture adequate volume   Culture   Final    NO GROWTH 5 DAYS Performed at [facility], [place] [address] [place], [place] [address]    Report Status 12/29/2019 FINAL  Final  Blood Culture (routine x 2)     Status: None   Collection Time: 12/24/19  5:51 PM   Specimen: BLOOD RIGHT HAND  Result Value Ref Range Status   Specimen Description BLOOD RIGHT HAND  Final   Special Requests   Final    BOTTLES DRAWN AEROBIC AND ANAEROBIC Blood Culture adequate volume   Culture   Final    NO GROWTH 5 DAYS Performed at [facility], [place] [address] [place], [place] [address]    Report Status 12/29/2019 FINAL  Final  Respiratory Panel by RT PCR (Flu A&B, Covid) - Nasopharyngeal Swab     Status: Abnormal   Collection Time: 12/24/19  6:39 PM   Specimen: Nasopharyngeal Swab  Result Value Ref Range Status   SARS Coronavirus 2 by RT PCR POSITIVE (A) NEGATIVE Final    Comment: RESULT CALLED TO, READ BACK BY AND VERIFIED WITH: Trudi Ida RN 2030 12/24/19 A BROWNING (NOTE) SARS-CoV-2 target nucleic acids are DETECTED.  SARS-CoV-2 RNA is generally detectable in upper respiratory specimens  during the acute phase of infection. Positive results are indicative of the presence of the identified virus, but do not rule out bacterial infection or co-infection with other pathogens not detected by the test. Clinical correlation with patient history and other diagnostic information is necessary to determine patient infection status. The expected result is Negative.  Fact Sheet for Patients:  PinkCheek.be  Fact Sheet for Healthcare Providers: GravelBags.it  This test is not yet  approved or cleared by the [place] FDA and  has been authorized for detection and/or diagnosis of SARS-CoV-2 by FDA under an Emergency Use Authorization (EUA).  This EUA will remain in effect (meaning this test can be  used) for the duration of  the COVID-19 declaration under Section 564(b)(1) of the Act, 21 U.S.C. section 360bbb-3(b)(1), unless the authorization is terminated or revoked sooner.      Influenza A by PCR NEGATIVE NEGATIVE Final   Influenza B by PCR NEGATIVE NEGATIVE Final    Comment: (NOTE) The Xpert Xpress SARS-CoV-2/FLU/RSV assay is intended as an aid in  the diagnosis of influenza from Nasopharyngeal swab specimens and  should not be used as a sole basis for treatment. Nasal washings and  aspirates are unacceptable for Xpert Xpress SARS-CoV-2/FLU/RSV  testing.  Fact Sheet for Patients: PinkCheek.be  Fact Sheet for Healthcare Providers: GravelBags.it  This test is not yet approved or cleared by the [place] FDA and  has been authorized for detection and/or diagnosis of SARS-CoV-2 by  FDA under an Emergency Use Authorization (EUA). This EUA will remain  in effect (meaning this test can be used) for the duration of the  Covid-19 declaration under Section 564(b)(1) of the Act, 21  U.S.C. section 360bbb-3(b)(1), unless the authorization is  terminated or revoked. Performed at [facility], [place] [address], [place],  [address]   MRSA PCR Screening     Status: Abnormal   Collection Time: 12/25/19 11:38 AM   Specimen: Nasal Mucosa; Nasopharyngeal  Result Value Ref Range Status   MRSA by PCR POSITIVE (A) NEGATIVE Final    Comment:        The GeneXpert  MRSA Assay (FDA approved for NASAL specimens only), is one component of a comprehensive MRSA colonization surveillance program. It is not intended to diagnose MRSA infection nor to guide or monitor treatment for MRSA  infections. RESULT CALLED TO, READ BACK BY AND VERIFIED WITH: Genella Rife RN 15:30 12/25/19 ([email or web address]) Performed at [facility], [place] [address], [place], [place] [address]   Surgical PCR screen     Status: Abnormal   Collection Time: 12/26/19 11:46 PM   Specimen: Nasal Mucosa; Nasal Swab  Result Value Ref Range Status   MRSA, PCR POSITIVE (A) NEGATIVE Final    Comment: RESULT CALLED TO, READ BACK BY AND VERIFIED WITH: M [facility] RN 12/27/19 0531 JDW    Staphylococcus aureus POSITIVE (A) NEGATIVE Final    Comment: (NOTE) The Xpert SA Assay (FDA approved for NASAL specimens in patients 75 years of age and older), is one component of a comprehensive surveillance program. It is not intended to diagnose infection nor to guide or monitor treatment. Performed at [facility], [place] [address], [place], [place] [address]        Today   Subjective:   Shirley Bates today with no significant events overnight as discussed with staff.  Objective:   Blood pressure 130/69, pulse 99, temperature 98.4 F (36.9 C), temperature source Axillary, resp. rate 14, weight 52.7 kg, SpO2 96 %.   Intake/Output Summary (Last 24 hours) at 01/01/2020 1245 Last data filed at 12/31/2019 2147 Gross per 24 hour  Intake 220.67 ml  Output 700 ml  Net -479.33 ml    Exam Awake, extremely frail, pleasantly demented. Symmetrical Chest wall movement, Minister air entry bilaterally at the bases, but more significant in the right side  irregular irregular +ve B.Sounds, Abd Soft, Non tender, No rebound -guarding or rigidity. No Cyanosis, Clubbing or edema, No new Rash or bruise  Data Review   CBC w Diff:  Lab Results  Component Value Date   WBC 12.5 (H) 01/01/2020   HGB 13.4 01/01/2020   HGB 11.0 (L) 11/08/2019   HCT 42.0 01/01/2020   HCT 34.4 11/08/2019   PLT 161 01/01/2020   PLT 337 11/08/2019   LYMPHOPCT 3 12/29/2019   MONOPCT 4 12/29/2019   EOSPCT 0 12/29/2019   BASOPCT 0 12/29/2019     CMP:  Lab Results  Component Value Date   NA 146 (H) 01/01/2020   NA 135 12/03/2019   K 3.2 (L) 01/01/2020   CL 97 (L) 01/01/2020   CO2 39 (H) 01/01/2020   BUN 35 (H) 01/01/2020   BUN 13 12/03/2019   CREATININE 0.69 01/01/2020   CREATININE 0.84 03/30/2019   PROT 5.5 (L) 01/01/2020   ALBUMIN 2.0 (L) 01/01/2020   BILITOT 0.9 01/01/2020   ALKPHOS 106 01/01/2020   AST 31 01/01/2020   ALT 54 (H) 01/01/2020  .   Total Time in preparing paper work, data evaluation and todays exam - 60 minutes  Phillips Climes M.D on 01/01/2020 at 12:45 PM  [facility]   Office  [phone]

## 2020-01-01 NOTE — Plan of Care (Signed)

## 2020-01-01 NOTE — Progress Notes (Signed)
Report called to [place], AVS placed in discharge packet.

## 2020-01-01 NOTE — Progress Notes (Addendum)
Pt given all night meds(Transport team is aware. Note put in AVS). Pt given bath, applied home clothes, and valuables given to transport team. Vitals are WNLs for her baseline. Updated AVS given to transport team. Will attempt to call family to update them on current situation.  Updated son-n-law on pt location. He understood and is enquiring about why d/c happened so late. Educated him on how we don't control times for pt pickup and that they have scheduled appts and emergency pickups. He seemed to understand. I advised him to call facility to check on pt admission status.

## 2020-01-01 NOTE — TOC Transition Note (Signed)
Transition of Care [facility]) - CM/SW Discharge Note   Patient Details  Name: Shirley Bates MRN: [id number] Date of Birth: [birth year]  Transition of Care [facility]) CM/SW Contact:  Angelita Ingles, RN Phone Number: [phone]  01/01/2020, 3:51 PM   Clinical Narrative: Damaris Schooner with Jimmie Molly at [facility] who confirms that patient is able to return to the facility. Transport has been set up with [facility]. Primary nurse made aware. Daughter [name] has been updated. Discharge packet at desk.   Please call report to  [facility] [employer]  [phone]  Room # 301       Final next level of care: Memory Care Barriers to Discharge: No Barriers Identified   Patient Goals and CMS Choice Patient states their goals for this hospitalization and ongoing recovery are:: Daughter states mother needs to gain strength CMS Medicare.gov Compare Post Acute Care list provided to:: Patient Represenative (must comment) (daughter [name]) Choice offered to / list presented to : Adult Children (Daughter [name])  Discharge Placement              Patient chooses bed at:  [facility]) Patient to be transferred to facility by: [place] Name of family member notified: [name] daughter Patient and family notified of of transfer: 01/01/20  Discharge Plan and Services In-house Referral: NA Discharge Planning Services: CM Consult Post Acute Care Choice: [place]          DME Arranged: N/A DME Agency: NA       HH Arranged: NA HH Agency: NA        Social Determinants of Health ([place]) Interventions     Readmission Risk Interventions Readmission Risk Prevention Plan 12/26/2019  Transportation Screening Complete  PCP or Specialist Appt within 3-5 Days Complete  HRI or [place] Complete  Social Work Consult for [place] Planning/Counseling Complete  Palliative Care Screening Not Applicable  Medication Review Press photographer) Referral to Pharmacy  Some  recent data might be hidden

## 2020-01-01 NOTE — TOC Progression Note (Addendum)
Transition of Care [facility]) - Progression Note    Patient Details  Name: Shirley Bates MRN: [id number] Date of Birth: [birth year]  Transition of Care [facility]) CM/SW [place], RN Phone Number: [phone]  01/01/2020, 2:26 PM  Clinical Narrative:    Updated FL2 has been faxed to [facility] per administrator Jimmie Molly request for readmit. CM has called [facility] to follow up for final approval for patient to return. Message has been left for [facility]. Will await return call to determine room # and if any other info is needed.   1450 Attempted to call Jimmie Molly (Administrator at [facility]) to determine if patient is able to return to facility. Pamala Hurry does not answer the telephone. Message has been left. Will await return call.     Expected Discharge Plan: [place] Barriers to Discharge: Continued Medical Work up  Expected Discharge Plan and Services Expected Discharge Plan: [place] In-house Referral: NA Discharge Planning Services: CM Consult Post Acute Care Choice: [place] Living arrangements for the past 2 months: [place] Expected Discharge Date: 01/01/20               DME Arranged: N/A DME Agency: NA       HH Arranged: NA HH Agency: NA         Social Determinants of Health (SDOH) Interventions    Readmission Risk Interventions Readmission Risk Prevention Plan 12/26/2019  Transportation Screening Complete  PCP or Specialist Appt within 3-5 Days Complete  HRI or [place] Complete  Social Work Consult for [place] Planning/Counseling Complete  Palliative Care Screening Not Applicable  Medication Review Press photographer) Referral to Pharmacy  Some recent data might be hidden

## 2020-01-01 NOTE — Discharge Instructions (Signed)
Person Under Monitoring Name: Shirley Bates  Location: [address] [place] [address]   Infection Prevention Recommendations for Individuals Confirmed to have, or Being Evaluated for, 2019 Novel Coronavirus (COVID-19) Infection Who Receive Care at Home  Individuals who are confirmed to have, or are being evaluated for, COVID-19 should follow the prevention steps below until a healthcare provider or local or state health department says they can return to normal activities.  Stay home except to get medical care You should restrict activities outside your home, except for getting medical care. Do not go to work, school, or public areas, and do not use public transportation or taxis.  Call ahead before visiting your doctor Before your medical appointment, call the healthcare provider and tell them that you have, or are being evaluated for, COVID-19 infection. This will help the healthcare provider's office take steps to keep other people from getting infected. Ask your healthcare provider to call the local or state health department.  Monitor your symptoms Seek prompt medical attention if your illness is worsening (e.g., difficulty breathing). Before going to your medical appointment, call the healthcare provider and tell them that you have, or are being evaluated for, COVID-19 infection. Ask your healthcare provider to call the local or state health department.  Wear a facemask You should wear a facemask that covers your nose and mouth when you are in the same room with other people and when you visit a healthcare provider. People who live with or visit you should also wear a facemask while they are in the same room with you.  Separate yourself from other people in your home As much as possible, you should stay in a different room from other people in your home. Also, you should use a separate bathroom, if available.  Avoid sharing household items You should not share  dishes, drinking glasses, cups, eating utensils, towels, bedding, or other items with other people in your home. After using these items, you should wash them thoroughly with soap and water.  Cover your coughs and sneezes Cover your mouth and nose with a tissue when you cough or sneeze, or you can cough or sneeze into your sleeve. Throw used tissues in a lined trash can, and immediately wash your hands with soap and water for at least 20 seconds or use an alcohol-based hand rub.  Wash your [facility] your hands often and thoroughly with soap and water for at least 20 seconds. You can use an alcohol-based hand sanitizer if soap and water are not available and if your hands are not visibly dirty. Avoid touching your eyes, nose, and mouth with unwashed hands.   Prevention Steps for Caregivers and Household Members of Individuals Confirmed to have, or Being Evaluated for, COVID-19 Infection Being Cared for in the Home  If you live with, or provide care at home for, a person confirmed to have, or being evaluated for, COVID-19 infection please follow these guidelines to prevent infection:  Follow healthcare provider's instructions Make sure that you understand and can help the patient follow any healthcare provider instructions for all care.  Provide for the patient's basic needs You should help the patient with basic needs in the home and provide support for getting groceries, prescriptions, and other personal needs.  Monitor the patient's symptoms If they are getting sicker, call his or her medical provider and tell them that the patient has, or is being evaluated for, COVID-19 infection. This will help the healthcare provider's  office take steps to keep other people from getting infected. Ask the healthcare provider to call the local or state health department.  Limit the number of people who have contact with the patient  If possible, have only one caregiver for the patient.  Other  household members should stay in another home or place of residence. If this is not possible, they should stay  in another room, or be separated from the patient as much as possible. Use a separate bathroom, if available.  Restrict visitors who do not have an essential need to be in the home.  Keep older adults, very young children, and other sick people away from the patient Keep older adults, very young children, and those who have compromised immune systems or chronic health conditions away from the patient. This includes people with chronic heart, lung, or kidney conditions, diabetes, and cancer.  Ensure good ventilation Make sure that shared spaces in the home have good air flow, such as from an air conditioner or an opened window, weather permitting.  Wash your hands often  Wash your hands often and thoroughly with soap and water for at least 20 seconds. You can use an alcohol based hand sanitizer if soap and water are not available and if your hands are not visibly dirty.  Avoid touching your eyes, nose, and mouth with unwashed hands.  Use disposable paper towels to dry your hands. If not available, use dedicated cloth towels and replace them when they become wet.  Wear a facemask and gloves  Wear a disposable facemask at all times in the room and gloves when you touch or have contact with the patient's blood, body fluids, and/or secretions or excretions, such as sweat, saliva, sputum, nasal mucus, vomit, urine, or feces.  Ensure the mask fits over your nose and mouth tightly, and do not touch it during use.  Throw out disposable facemasks and gloves after using them. Do not reuse.  Wash your hands immediately after removing your facemask and gloves.  If your personal clothing becomes contaminated, carefully remove clothing and launder. Wash your hands after handling contaminated clothing.  Place all used disposable facemasks, gloves, and other waste in a lined container before  disposing them with other household waste.  Remove gloves and wash your hands immediately after handling these items.  Do not share dishes, glasses, or other household items with the patient  Avoid sharing household items. You should not share dishes, drinking glasses, cups, eating utensils, towels, bedding, or other items with a patient who is confirmed to have, or being evaluated for, COVID-19 infection.  After the person uses these items, you should wash them thoroughly with soap and water.  Wash laundry thoroughly  Immediately remove and wash clothes or bedding that have blood, body fluids, and/or secretions or excretions, such as sweat, saliva, sputum, nasal mucus, vomit, urine, or feces, on them.  Wear gloves when handling laundry from the patient.  Read and follow directions on labels of laundry or clothing items and detergent. In general, wash and dry with the warmest temperatures recommended on the label.  Clean all areas the individual has used often  Clean all touchable surfaces, such as counters, tabletops, doorknobs, bathroom fixtures, toilets, phones, keyboards, tablets, and bedside tables, every day. Also, clean any surfaces that may have blood, body fluids, and/or secretions or excretions on them.  Wear gloves when cleaning surfaces the patient has come in contact with.  Use a diluted bleach solution (e.g., dilute bleach with 1  part bleach and 10 parts water) or a household disinfectant with a label that says EPA-registered for coronaviruses. To make a bleach solution at home, add 1 tablespoon of bleach to 1 quart (4 cups) of water. For a larger supply, add  cup of bleach to 1 gallon (16 cups) of water.  Read labels of cleaning products and follow recommendations provided on product labels. Labels contain instructions for safe and effective use of the cleaning product including precautions you should take when applying the product, such as wearing gloves or eye protection  and making sure you have good ventilation during use of the product.  Remove gloves and wash hands immediately after cleaning.  Monitor yourself for signs and symptoms of illness Caregivers and household members are considered close contacts, should monitor their health, and will be asked to limit movement outside of the home to the extent possible. Follow the monitoring steps for close contacts listed on the symptom monitoring form.   ? If you have additional questions, contact your local health department or call the epidemiologist on call at [phone] (available 24/7). ? This guidance is subject to change. For the most up-to-date guidance from [facility], please refer to their website: [email or web address]

## 2020-01-02 ENCOUNTER — Encounter: Payer: Self-pay | Admitting: General Practice

## 2020-01-16 ENCOUNTER — Telehealth: Payer: Medicare Other | Admitting: General Practice

## 2020-01-21 DEATH — deceased

## 2020-02-06 ENCOUNTER — Other Ambulatory Visit (HOSPITAL_COMMUNITY): Payer: Medicare Other

## 2020-02-08 ENCOUNTER — Other Ambulatory Visit (HOSPITAL_COMMUNITY): Payer: Medicare Other

## 2020-02-18 ENCOUNTER — Telehealth: Payer: Medicare Other | Admitting: Cardiology

## 2021-10-18 IMAGING — CR DG CHEST 2V
2 series · 2 of 2 positions shown · non-contrast
Comparison: 10/12/2019

CLINICAL DATA: Shortness of breath

EXAM:
CHEST - 2 VIEW

[chest lat]
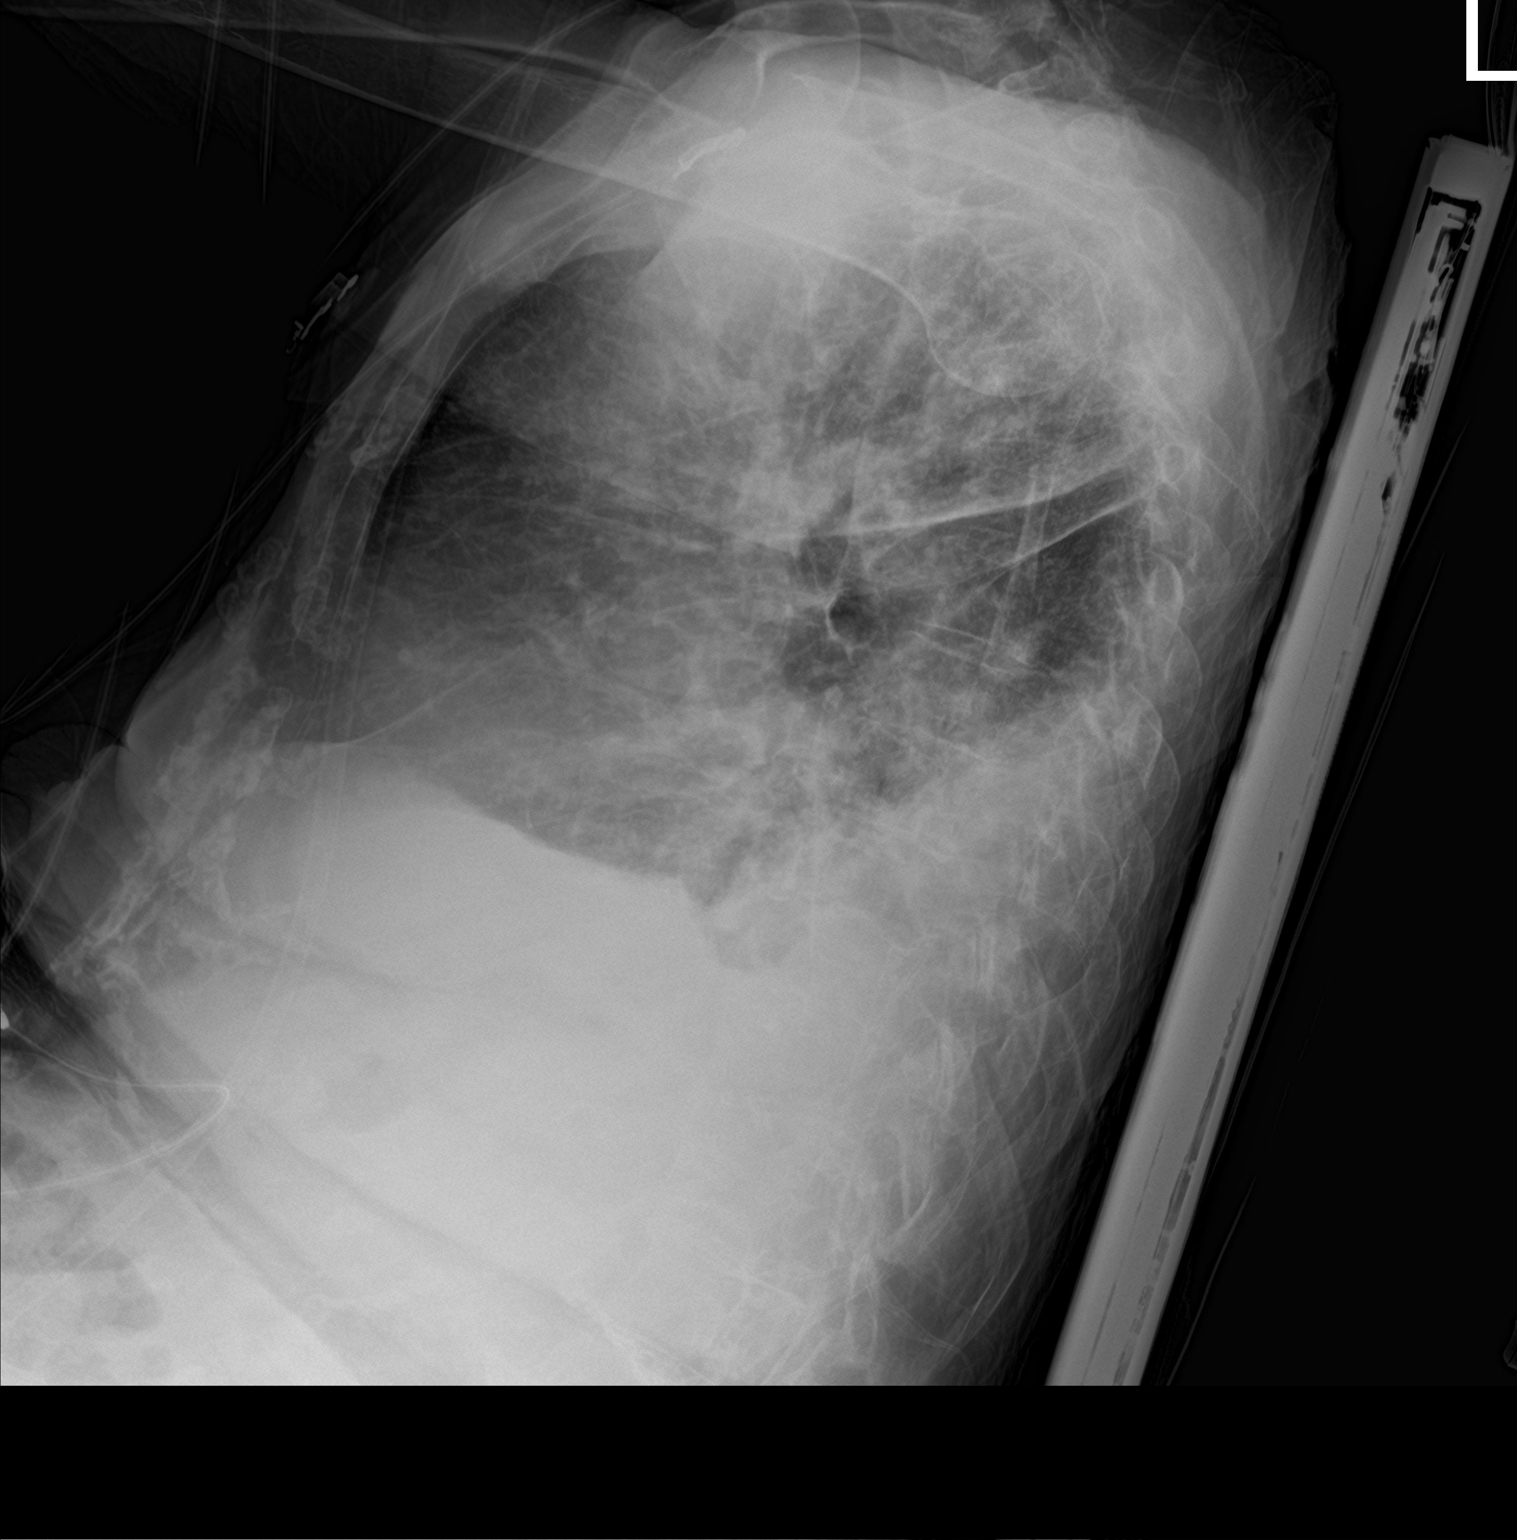

[chest ap]
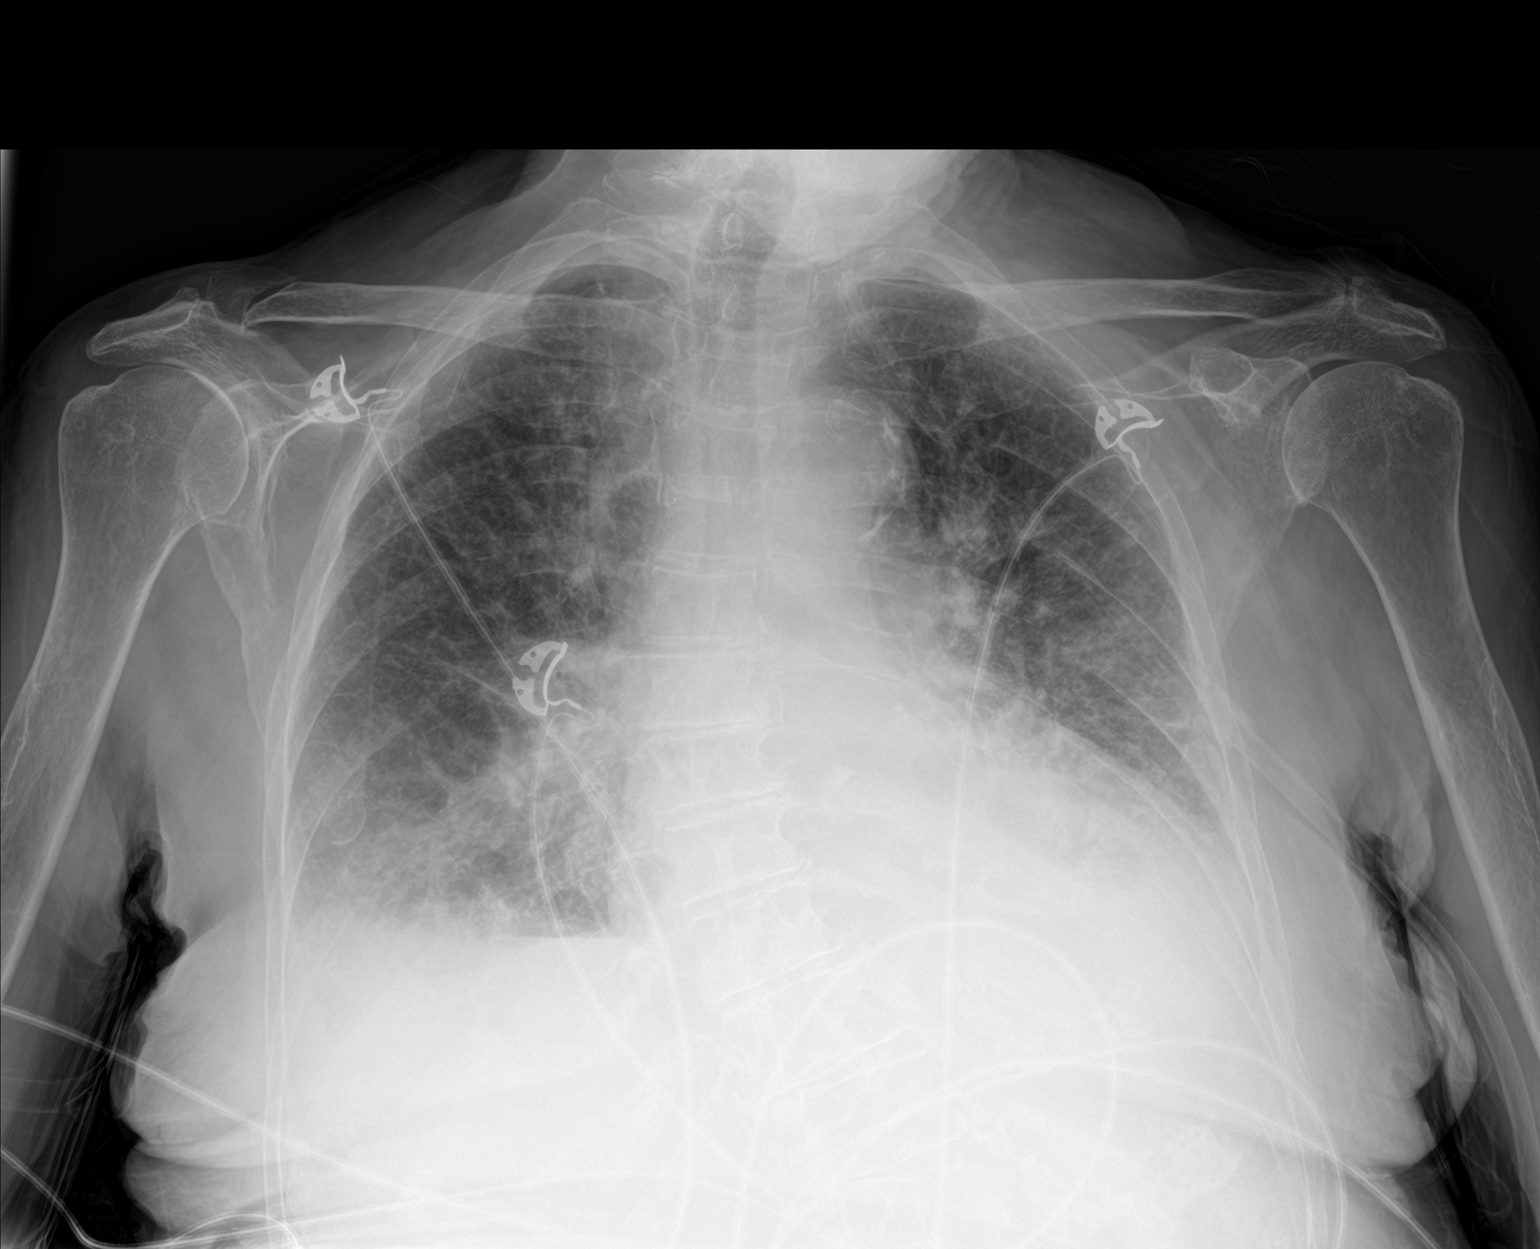

[2 of 2 positions shown; findings below may reference images not displayed]

FINDINGS: Cardiomegaly with small bilateral pleural effusions. Vascular
congestion with diffuse interstitial edema. Consolidations within
the left greater than right lung base. Aortic atherosclerosis.
IMPRESSION: 1. Cardiomegaly with vascular congestion, diffuse interstitial
edema, and small bilateral pleural effusions.
2. Bibasilar atelectasis or pneumonia.

## 2021-11-25 IMAGING — US US ABDOMEN LIMITED
1 series · 14 of 25 positions shown · non-contrast
Comparison: 04/25/2019

CLINICAL DATA: Transaminitis

EXAM:
ULTRASOUND ABDOMEN LIMITED RIGHT UPPER QUADRANT

[Series 1: us abdomen limited ruq · 14 of 30 slices shown]
[im 1/30]
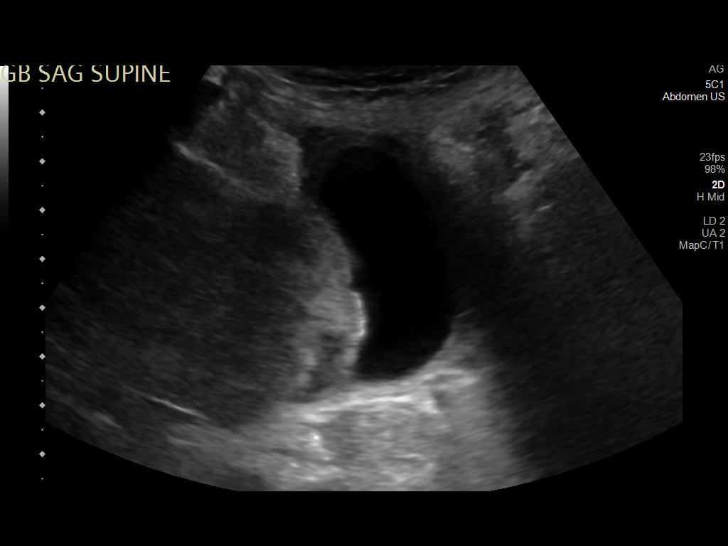
[im 3/30]
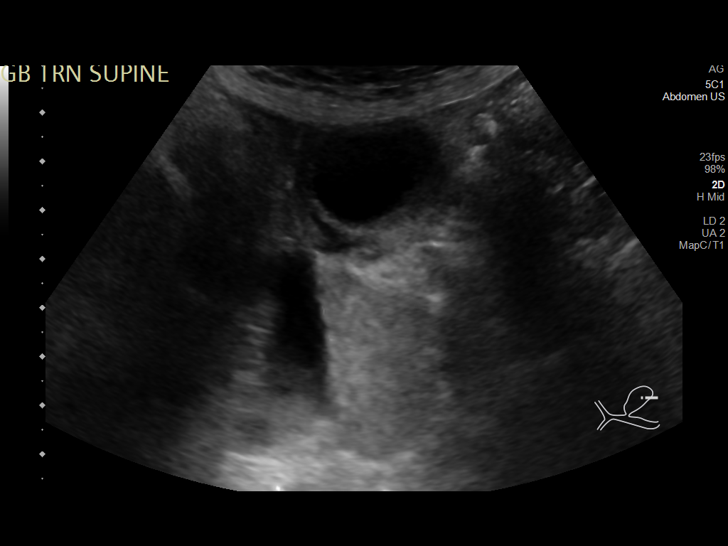
[im 5/30]
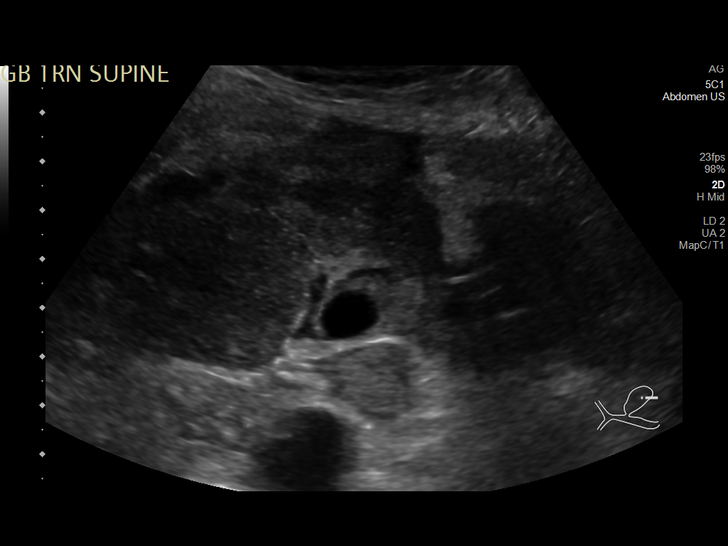
[im 8/30]
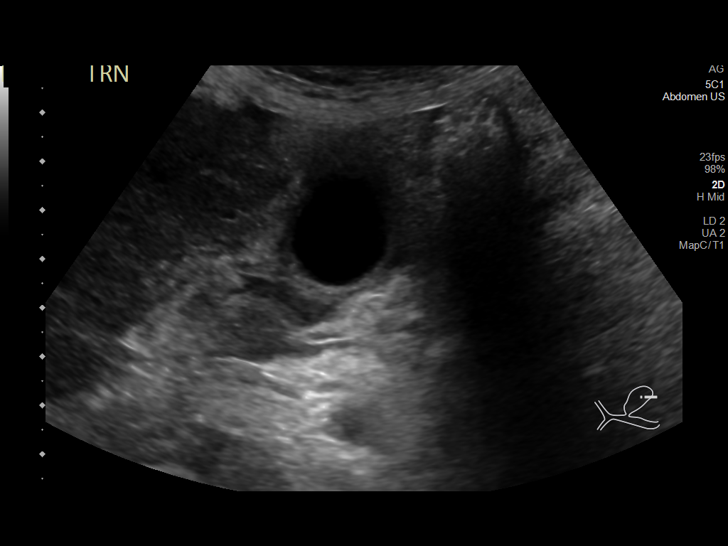
[im 10/30]
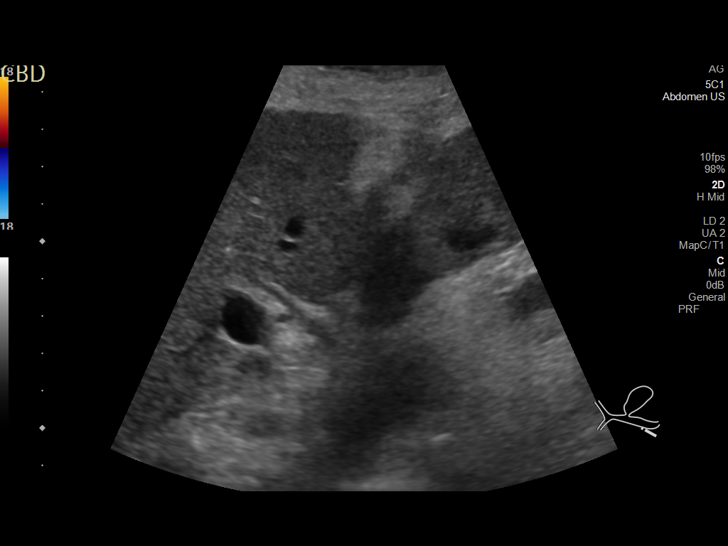
[im 11/30]
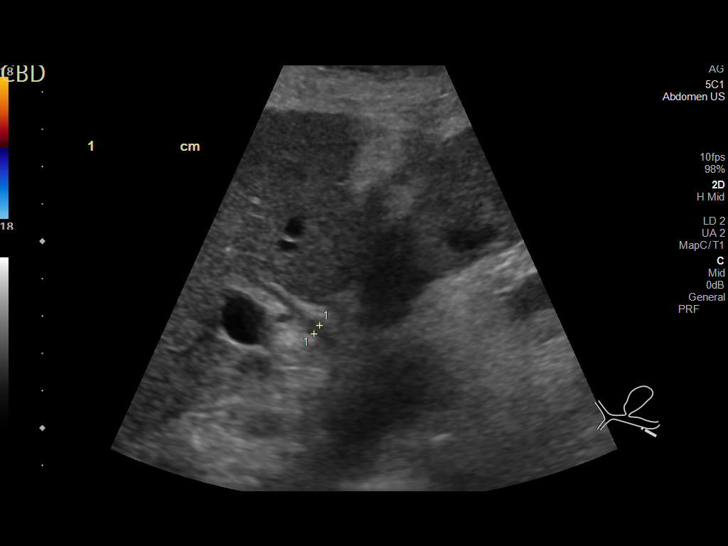
[im 14/30]
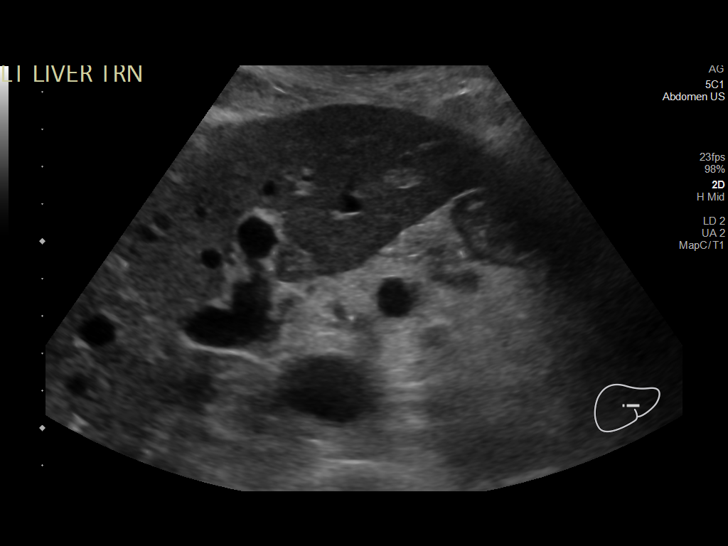
[im 16/30]
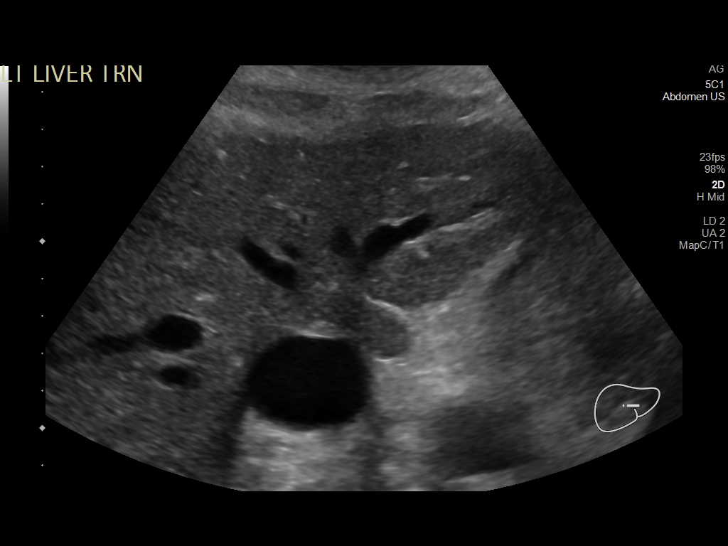
[im 19/30]
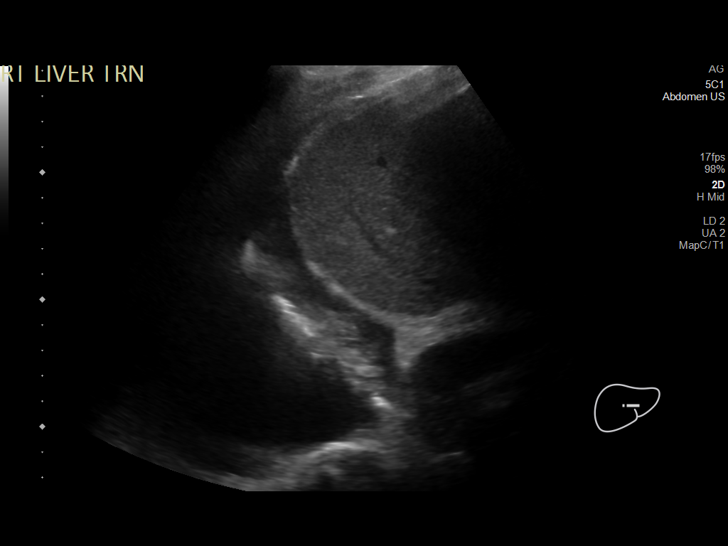
[im 20/30]
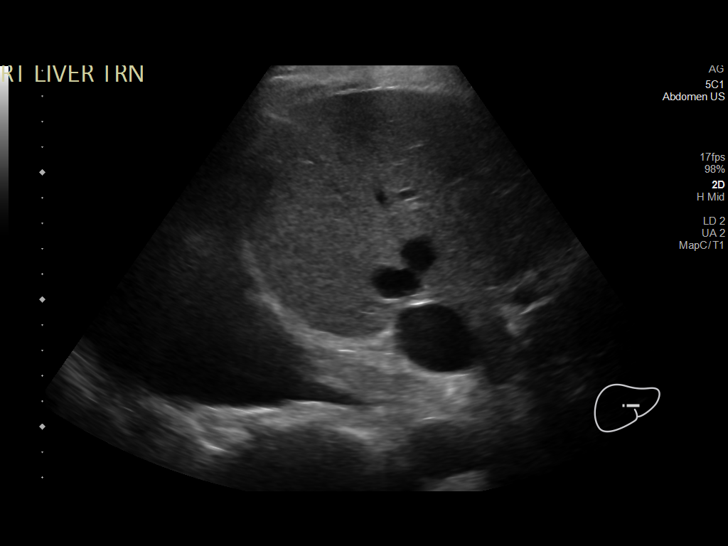
[im 22/30]
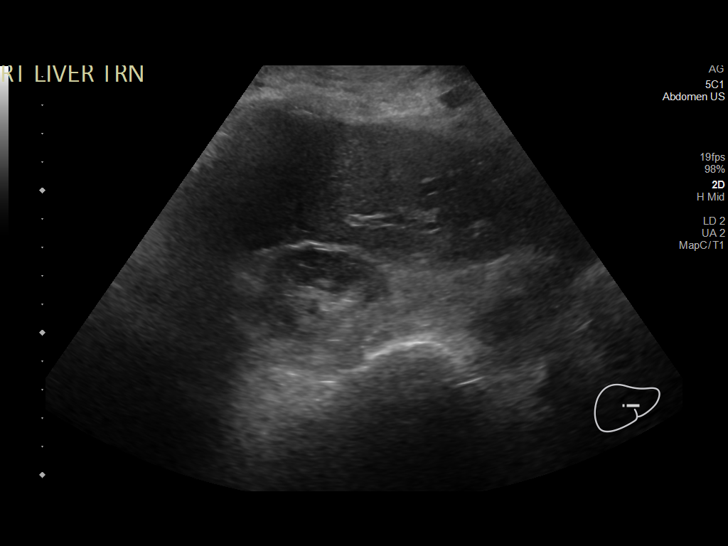
[im 25/30]
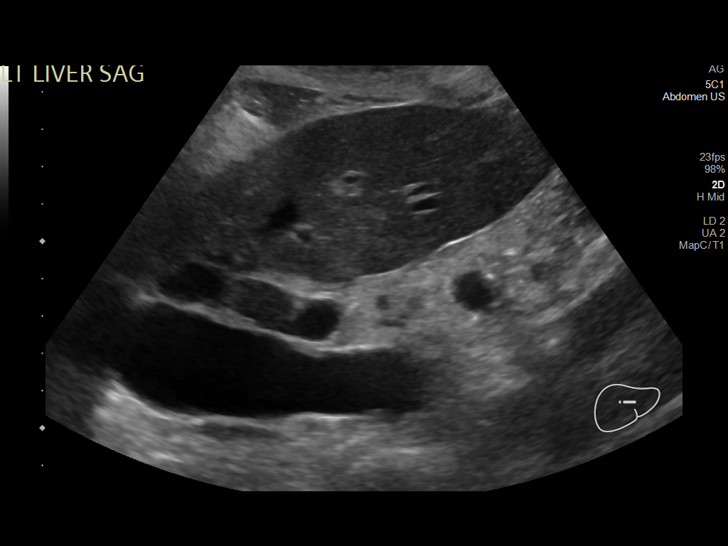
[im 27/30]
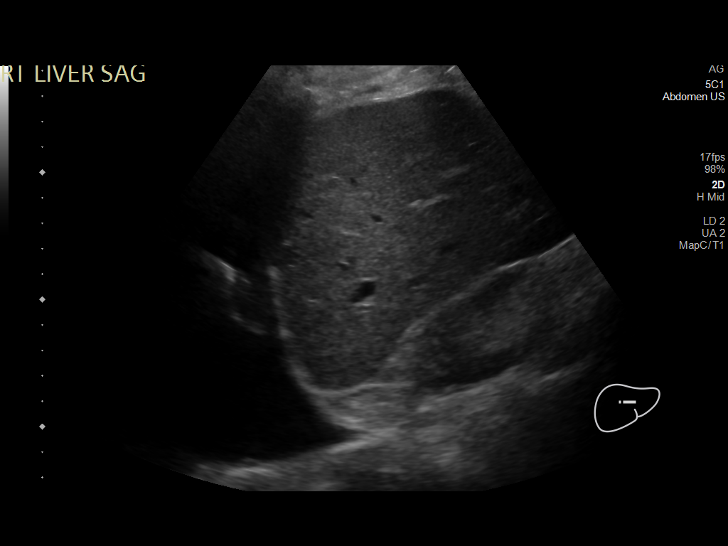
[im 30/30]
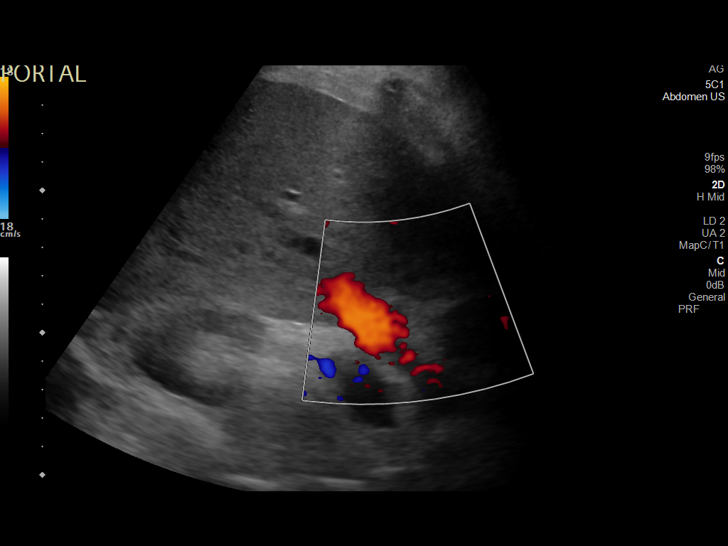

[14 of 25 positions shown; findings below may reference images not displayed]

FINDINGS: Gallbladder:

There is pericholecystic free fluid with mild gallbladder wall
thickening with the gallbladder wall measuring approximately 3 mm.
There are no gallstones. The sonographic Murphy sign is reported as
negative.

Common bile duct:

Diameter: 3 mm

Liver:

The liver parenchyma is coarsened and heterogeneous. Liver surface
appears somewhat nodular. Portal vein is patent on color Doppler
imaging with normal direction of blood flow towards the liver.

Other: There is a large right-sided pleural effusion. There is a
small volume of ascites in the upper abdomen.
IMPRESSION: 1. Gallbladder wall thickening without cholelithiasis or definite
sonographic evidence for acute cholecystitis. Gallbladder wall
thickening is a nonspecific finding and can be seen in patients with
ascites or heart failure.
2. Incidentally noted large right-sided pleural effusion.
3. Small volume ascites.
4. Again noted is a coarsened and heterogeneous appearance of the
liver parenchyma consistent with underlying hepatocellular disease.
